# Patient Record
Sex: Female | Born: 1950 | Race: White | Hispanic: No | Smoking: Never smoker
Health system: Southern US, Community
[De-identification: ages and names within clinical notes are randomized; demographics above are authoritative.]

## PROBLEM LIST (undated history)

## (undated) ENCOUNTER — Emergency Department (HOSPITAL_BASED_OUTPATIENT_CLINIC_OR_DEPARTMENT_OTHER): Admission: EM | Payer: Medicare HMO

## (undated) DIAGNOSIS — J302 Other seasonal allergic rhinitis: Secondary | ICD-10-CM

## (undated) DIAGNOSIS — Z8709 Personal history of other diseases of the respiratory system: Secondary | ICD-10-CM

## (undated) DIAGNOSIS — M2550 Pain in unspecified joint: Secondary | ICD-10-CM

## (undated) DIAGNOSIS — K219 Gastro-esophageal reflux disease without esophagitis: Secondary | ICD-10-CM

## (undated) DIAGNOSIS — R531 Weakness: Secondary | ICD-10-CM

## (undated) DIAGNOSIS — Z860100 Personal history of colon polyps, unspecified: Secondary | ICD-10-CM

## (undated) DIAGNOSIS — G4700 Insomnia, unspecified: Secondary | ICD-10-CM

## (undated) DIAGNOSIS — M1990 Unspecified osteoarthritis, unspecified site: Secondary | ICD-10-CM

## (undated) DIAGNOSIS — I10 Essential (primary) hypertension: Secondary | ICD-10-CM

## (undated) DIAGNOSIS — F419 Anxiety disorder, unspecified: Secondary | ICD-10-CM

## (undated) DIAGNOSIS — E785 Hyperlipidemia, unspecified: Secondary | ICD-10-CM

## (undated) DIAGNOSIS — R197 Diarrhea, unspecified: Secondary | ICD-10-CM

## (undated) DIAGNOSIS — G8929 Other chronic pain: Secondary | ICD-10-CM

## (undated) DIAGNOSIS — Z86718 Personal history of other venous thrombosis and embolism: Secondary | ICD-10-CM

## (undated) DIAGNOSIS — M549 Dorsalgia, unspecified: Secondary | ICD-10-CM

## (undated) HISTORY — PX: ESOPHAGOGASTRODUODENOSCOPY: SHX1529

## (undated) HISTORY — PX: ANAL FISSURE REPAIR: SHX2312

## (undated) HISTORY — PX: OTHER SURGICAL HISTORY: SHX169

## (undated) HISTORY — DX: Essential (primary) hypertension: I10

## (undated) HISTORY — PX: WRIST SURGERY: SHX841

## (undated) HISTORY — PX: TONSILLECTOMY: SUR1361

---

## 2000-02-24 ENCOUNTER — Encounter: Payer: Self-pay | Admitting: Emergency Medicine

## 2000-02-24 ENCOUNTER — Emergency Department (HOSPITAL_COMMUNITY): Admission: EM | Admit: 2000-02-24 | Discharge: 2000-02-24 | Payer: Self-pay | Admitting: Emergency Medicine

## 2000-03-15 ENCOUNTER — Ambulatory Visit (HOSPITAL_COMMUNITY): Admission: RE | Admit: 2000-03-15 | Discharge: 2000-03-15 | Payer: Self-pay | Admitting: Orthopedic Surgery

## 2000-12-02 ENCOUNTER — Emergency Department (HOSPITAL_COMMUNITY): Admission: EM | Admit: 2000-12-02 | Discharge: 2000-12-02 | Payer: Self-pay

## 2000-12-03 ENCOUNTER — Emergency Department (HOSPITAL_COMMUNITY): Admission: EM | Admit: 2000-12-03 | Discharge: 2000-12-03 | Payer: Self-pay | Admitting: Emergency Medicine

## 2002-08-04 ENCOUNTER — Encounter: Payer: Self-pay | Admitting: Orthopedic Surgery

## 2002-08-04 ENCOUNTER — Encounter: Admission: RE | Admit: 2002-08-04 | Discharge: 2002-08-04 | Payer: Self-pay | Admitting: Orthopedic Surgery

## 2002-08-17 ENCOUNTER — Encounter: Payer: Self-pay | Admitting: Orthopedic Surgery

## 2002-08-17 ENCOUNTER — Encounter: Admission: RE | Admit: 2002-08-17 | Discharge: 2002-08-17 | Payer: Self-pay | Admitting: Orthopedic Surgery

## 2002-09-01 ENCOUNTER — Encounter: Payer: Self-pay | Admitting: Orthopedic Surgery

## 2002-09-01 ENCOUNTER — Encounter: Admission: RE | Admit: 2002-09-01 | Discharge: 2002-09-01 | Payer: Self-pay | Admitting: Orthopedic Surgery

## 2007-06-22 ENCOUNTER — Inpatient Hospital Stay (HOSPITAL_COMMUNITY): Admission: AD | Admit: 2007-06-22 | Discharge: 2007-06-23 | Payer: Self-pay | Admitting: Orthopedic Surgery

## 2011-03-17 NOTE — Op Note (Signed)
NAMEAYSIA, LOWDER               ACCOUNT NO.:  [id number]   MEDICAL RECORD NO.:  [id number]          PATIENT TYPE:  OIB   LOCATION:  2550                         FACILITY:  [facility]   PHYSICIAN:  Nadara Mustard, MD     DATE OF BIRTH:  1951/09/28   DATE OF PROCEDURE:  06/21/2007  DATE OF DISCHARGE:                               OPERATIVE REPORT   PREOPERATIVE DIAGNOSIS:  Displaced right distal radius fracture.   POSTOPERATIVE DIAGNOSIS:  Displaced right distal radius fracture.   PROCEDURE:  Open reduction internal fixation, right distal radius.   SURGEON:  Nadara Mustard, MD   ANESTHESIA:  General.   ESTIMATED BLOOD LOSS:  Minimal.   ANTIBIOTICS:  1 gram of Kefzol.   DRAINS:  None.   COMPLICATIONS:  None.   TOURNIQUET TIME:  Esmarch at the forearm, approximately 81 minutes.   DISPOSITION:  To PACU in stable condition.   INDICATIONS FOR PROCEDURE:  The patient is a 60 year old woman who had a  nondisplaced intra-articular right distal radius fracture.  The patient  was placed in a splint and was followed up at two weeks out.  The  patient returned in follow-up, shows displacement of fracture.  The  patient states that she had taken the splint off to do several  activities of daily living and this may have been the cause of the  displacement; however, due to the dorsal displacement of fracture and  widening of the fracture, the patient presents at this time for open  reduction internal fixation.  Risks and benefits were discussed  including infection, neurovascular injury, persistent pain, arthritis,  need for additional surgery.  The patient states she understands and  wished proceed at this time.   DESCRIPTION OF PROCEDURE:  The patient was brought to OR room 1 and  underwent general anesthetic.  After adequate level of anesthesia  obtained, the patient's right upper extremities prepped using DuraPrep  and draped into a sterile field.  The extensile approach of Sherilyn Cooter  was  used.  This was then carried down through the FCR sheath and the  neurovascular bundle was retracted radially and the quadratus was  reflected off the radial border of the radius and this was reflected  ulnarly.  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  The malunion was taken down and both the ulnar and  radial fragments of the fracture were taken down and reduced.  C-arm  fluoroscopy verified reduction after takedown of the malunion.  The  Synthes volar plate was then positioned secure with lag screw and then  locking screws were placed into the ulnar segment as well as the radial  styloid segment.  C-arm fluoroscopy verified reduction in both AP and  lateral planes.  She had restoration of the volar tilt to neutral and  restoration of alignment of the displaced interarticular fragment  fracture.  The tourniquet was deflated after approximately 81 minutes.  Hemostasis was obtained, wound was irrigated with normal saline.  Subcu  was closed using Vicryl.  The skin was closed using Proximate staples.  The wound  was covered with Adaptic orthopedic sponges, Webril and a  Coban dressing with a volar splint of plaster.  The patient was  extubated, taken to PACU in stable condition.  Plan for 23 hour  observation, discharge in the morning.      Nadara Mustard, MD  Electronically Signed     MVD/MEDQ  D:  06/21/2007  T:  06/22/2007  Job:  [id number]

## 2011-08-14 LAB — CBC
HCT: 41.5
Hemoglobin: 14.1
MCHC: 34
MCV: 88.3
Platelets: 341
RBC: 4.69
RDW: 13.1
WBC: 6.2

## 2012-11-02 DIAGNOSIS — Z8709 Personal history of other diseases of the respiratory system: Secondary | ICD-10-CM

## 2012-11-02 HISTORY — DX: Personal history of other diseases of the respiratory system: Z87.09

## 2013-06-15 ENCOUNTER — Other Ambulatory Visit: Payer: Self-pay

## 2013-06-15 DIAGNOSIS — Z1231 Encounter for screening mammogram for malignant neoplasm of breast: Secondary | ICD-10-CM

## 2013-06-19 ENCOUNTER — Ambulatory Visit
Admission: RE | Admit: 2013-06-19 | Discharge: 2013-06-19 | Disposition: A | Discharge status: Home / Self Care | Payer: BC Managed Care – PPO | Source: Ambulatory Visit

## 2013-06-19 DIAGNOSIS — Z1231 Encounter for screening mammogram for malignant neoplasm of breast: Secondary | ICD-10-CM

## 2013-06-21 ENCOUNTER — Other Ambulatory Visit: Payer: Self-pay | Admitting: Family Medicine

## 2013-06-21 DIAGNOSIS — R928 Other abnormal and inconclusive findings on diagnostic imaging of breast: Secondary | ICD-10-CM

## 2013-07-10 ENCOUNTER — Other Ambulatory Visit: Payer: BC Managed Care – PPO

## 2014-03-27 DIAGNOSIS — G4700 Insomnia, unspecified: Secondary | ICD-10-CM | POA: Insufficient documentation

## 2014-03-27 DIAGNOSIS — E559 Vitamin D deficiency, unspecified: Secondary | ICD-10-CM | POA: Insufficient documentation

## 2014-03-27 DIAGNOSIS — Z9109 Other allergy status, other than to drugs and biological substances: Secondary | ICD-10-CM | POA: Insufficient documentation

## 2014-03-27 DIAGNOSIS — K219 Gastro-esophageal reflux disease without esophagitis: Secondary | ICD-10-CM | POA: Insufficient documentation

## 2014-03-27 DIAGNOSIS — F419 Anxiety disorder, unspecified: Secondary | ICD-10-CM | POA: Insufficient documentation

## 2014-03-27 DIAGNOSIS — E7800 Pure hypercholesterolemia, unspecified: Secondary | ICD-10-CM | POA: Insufficient documentation

## 2014-04-27 DIAGNOSIS — T887XXA Unspecified adverse effect of drug or medicament, initial encounter: Secondary | ICD-10-CM | POA: Insufficient documentation

## 2014-04-27 DIAGNOSIS — R799 Abnormal finding of blood chemistry, unspecified: Secondary | ICD-10-CM | POA: Insufficient documentation

## 2014-04-27 DIAGNOSIS — R030 Elevated blood-pressure reading, without diagnosis of hypertension: Secondary | ICD-10-CM | POA: Insufficient documentation

## 2014-04-27 DIAGNOSIS — M778 Other enthesopathies, not elsewhere classified: Secondary | ICD-10-CM | POA: Insufficient documentation

## 2014-04-27 DIAGNOSIS — IMO0002 Reserved for concepts with insufficient information to code with codable children: Secondary | ICD-10-CM | POA: Insufficient documentation

## 2014-04-27 DIAGNOSIS — H18519 Endothelial corneal dystrophy, unspecified eye: Secondary | ICD-10-CM | POA: Insufficient documentation

## 2014-04-27 DIAGNOSIS — R233 Spontaneous ecchymoses: Secondary | ICD-10-CM | POA: Insufficient documentation

## 2014-04-27 DIAGNOSIS — H8110 Benign paroxysmal vertigo, unspecified ear: Secondary | ICD-10-CM | POA: Insufficient documentation

## 2014-04-27 DIAGNOSIS — M940 Chondrocostal junction syndrome [Tietze]: Secondary | ICD-10-CM | POA: Insufficient documentation

## 2014-04-27 DIAGNOSIS — I10 Essential (primary) hypertension: Secondary | ICD-10-CM | POA: Insufficient documentation

## 2014-04-27 DIAGNOSIS — L820 Inflamed seborrheic keratosis: Secondary | ICD-10-CM | POA: Insufficient documentation

## 2014-04-27 DIAGNOSIS — E663 Overweight: Secondary | ICD-10-CM | POA: Insufficient documentation

## 2014-04-27 DIAGNOSIS — M7580 Other shoulder lesions, unspecified shoulder: Secondary | ICD-10-CM

## 2014-04-27 DIAGNOSIS — J309 Allergic rhinitis, unspecified: Secondary | ICD-10-CM | POA: Insufficient documentation

## 2014-04-27 DIAGNOSIS — H1851 Endothelial corneal dystrophy: Secondary | ICD-10-CM

## 2014-04-27 DIAGNOSIS — R740 Nonspecific elevation of levels of transaminase and lactic acid dehydrogenase [LDH]: Secondary | ICD-10-CM

## 2014-04-27 DIAGNOSIS — F4320 Adjustment disorder, unspecified: Secondary | ICD-10-CM | POA: Insufficient documentation

## 2014-05-21 ENCOUNTER — Other Ambulatory Visit: Payer: Self-pay

## 2014-05-21 DIAGNOSIS — Z1231 Encounter for screening mammogram for malignant neoplasm of breast: Secondary | ICD-10-CM

## 2014-06-20 ENCOUNTER — Ambulatory Visit
Admission: RE | Admit: 2014-06-20 | Discharge: 2014-06-20 | Disposition: A | Discharge status: Home / Self Care | Payer: BC Managed Care – PPO | Source: Ambulatory Visit

## 2014-06-20 DIAGNOSIS — Z1231 Encounter for screening mammogram for malignant neoplasm of breast: Secondary | ICD-10-CM

## 2014-09-04 ENCOUNTER — Other Ambulatory Visit (HOSPITAL_COMMUNITY): Payer: Self-pay | Admitting: Neurosurgery

## 2014-10-03 ENCOUNTER — Encounter (HOSPITAL_COMMUNITY): Payer: Self-pay

## 2014-10-03 ENCOUNTER — Encounter (HOSPITAL_COMMUNITY)
Admission: RE | Admit: 2014-10-03 | Discharge: 2014-10-03 | Disposition: A | Discharge status: Home / Self Care | Payer: BC Managed Care – PPO | Source: Ambulatory Visit | Attending: Neurosurgery | Admitting: Neurosurgery

## 2014-10-03 ENCOUNTER — Encounter (HOSPITAL_COMMUNITY): Payer: Self-pay | Admitting: Pharmacy Technician

## 2014-10-03 DIAGNOSIS — Z01812 Encounter for preprocedural laboratory examination: Secondary | ICD-10-CM | POA: Diagnosis not present

## 2014-10-03 HISTORY — DX: Other seasonal allergic rhinitis: J30.2

## 2014-10-03 HISTORY — DX: Personal history of other diseases of the respiratory system: Z87.09

## 2014-10-03 HISTORY — DX: Gastro-esophageal reflux disease without esophagitis: K21.9

## 2014-10-03 HISTORY — DX: Weakness: R53.1

## 2014-10-03 HISTORY — DX: Unspecified osteoarthritis, unspecified site: M19.90

## 2014-10-03 HISTORY — DX: Personal history of colonic polyps: Z86.010

## 2014-10-03 HISTORY — DX: Other chronic pain: G89.29

## 2014-10-03 HISTORY — DX: Insomnia, unspecified: G47.00

## 2014-10-03 HISTORY — DX: Diarrhea, unspecified: R19.7

## 2014-10-03 HISTORY — DX: Hyperlipidemia, unspecified: E78.5

## 2014-10-03 HISTORY — DX: Dorsalgia, unspecified: M54.9

## 2014-10-03 HISTORY — DX: Personal history of other venous thrombosis and embolism: Z86.718

## 2014-10-03 HISTORY — DX: Pain in unspecified joint: M25.50

## 2014-10-03 HISTORY — DX: Anxiety disorder, unspecified: F41.9

## 2014-10-03 HISTORY — DX: Personal history of colon polyps, unspecified: Z86.0100

## 2014-10-03 LAB — BASIC METABOLIC PANEL
Anion gap: 14 (ref 5–15)
BUN: 18 mg/dL (ref 6–23)
CHLORIDE: 100 meq/L (ref 96–112)
CO2: 23 mEq/L (ref 19–32)
CREATININE: 0.63 mg/dL (ref 0.50–1.10)
Calcium: 9 mg/dL (ref 8.4–10.5)
GLUCOSE: 92 mg/dL (ref 70–99)
POTASSIUM: 4.5 meq/L (ref 3.7–5.3)
SODIUM: 137 meq/L (ref 137–147)

## 2014-10-03 LAB — CBC
HCT: 41.9 % (ref 36.0–46.0)
HEMOGLOBIN: 14.2 g/dL (ref 12.0–15.0)
MCH: 29.2 pg (ref 26.0–34.0)
MCHC: 33.9 g/dL (ref 30.0–36.0)
MCV: 86.2 fL (ref 78.0–100.0)
PLATELETS: 256 10*3/uL (ref 150–400)
RBC: 4.86 MIL/uL (ref 3.87–5.11)
RDW: 12.8 % (ref 11.5–15.5)
WBC: 7.3 10*3/uL (ref 4.0–10.5)

## 2014-10-03 LAB — SURGICAL PCR SCREEN
MRSA, PCR: NEGATIVE
Staphylococcus aureus: NEGATIVE

## 2014-10-03 LAB — TYPE AND SCREEN
ABO/RH(D): B POS
Antibody Screen: NEGATIVE

## 2014-10-03 LAB — ABO/RH: ABO/RH(D): B POS

## 2014-10-03 NOTE — Pre-Procedure Instructions (Signed)
Ashley NajjarJudy V Craig  10/03/2014   Your procedure is scheduled on:  Thurs, Dec 10 @ 7:30 AM  [facility]  at 5:30 AM.  Call this number if you have problems the morning of surgery: [phone]   Remember:   Do not eat food or drink liquids after midnight.   Take these medicines the morning of surgery with A SIP OF WATER: Gabapentin(Neurontin),Omeprazole(Prilosec),Claritin,ProAir(if needed) and Nasonex(if needed)              No Goody's,BC's,Aleve,Aspirin,Ibuprofen,Fish Oil,or any Herbal Medications   Do not wear jewelry, make-up or nail polish.  Do not wear lotions, powders, or perfumes. You may wear deodorant.  Do not shave 48 hours prior to surgery.   Do not bring valuables to the hospital.  [facility] is not responsible                  for any belongings or valuables.               Contacts, dentures or bridgework may not be worn into surgery.  Leave suitcase in the car. After surgery it may be brought to your room.  For patients admitted to the hospital, discharge time is determined by your                treatment team.                 Special Instructions:  [place] - Preparing for Surgery  Before surgery, you can play an important role.  Because skin is not sterile, your skin needs to be as free of germs as possible.  You can reduce the number of germs on you skin by washing with CHG (chlorahexidine gluconate) soap before surgery.  CHG is an antiseptic cleaner which kills germs and bonds with the skin to continue killing germs even after washing.  Please DO NOT use if you have an allergy to CHG or antibacterial soaps.  If your skin becomes reddened/irritated stop using the CHG and inform your nurse when you arrive at Short Stay.  Do not shave (including legs and underarms) for at least 48 hours prior to the first CHG shower.  You may shave your face.  Please follow these instructions carefully:   1.  Shower with CHG Soap the night before surgery and the                                 morning of Surgery.  2.  If you choose to wash your hair, wash your hair first as usual with your       normal shampoo.  3.  After you shampoo, rinse your hair and body thoroughly to remove the                      Shampoo.  4.  Use CHG as you would any other liquid soap.  You can apply chg directly       to the skin and wash gently with scrungie or a clean washcloth.  5.  Apply the CHG Soap to your body ONLY FROM THE NECK DOWN.        Do not use on open wounds or open sores.  Avoid contact with your eyes,       ears, mouth and genitals (private parts).  Wash genitals (private parts)       with your  normal soap.  6.  Wash thoroughly, paying special attention to the area where your surgery        will be performed.  7.  Thoroughly rinse your body with warm water from the neck down.  8.  DO NOT shower/wash with your normal soap after using and rinsing off       the CHG Soap.  9.  Pat yourself dry with a clean towel.            10.  Wear clean pajamas.            11.  Place clean sheets on your bed the night of your first shower and do not        sleep with pets.  Day of Surgery  Do not apply any lotions/deoderants the morning of surgery.  Please wear clean clothes to the hospital/surgery center.     Please read over the following fact sheets that you were given: Pain Booklet, Coughing and Deep Breathing, Blood Transfusion Information, MRSA Information and Surgical Site Infection Prevention

## 2014-10-03 NOTE — Progress Notes (Signed)
Dr.Spencer Donnie Ahoilley is cardiologist (saw him d/t heart racing while going through a divorce) hasn't seen him in 13 yrs  Denies ever having an echo/stress test/heart cath  Did wear a Holter Monitor  Denies EKG or CXR in past yr   [facility]

## 2014-10-10 MED ORDER — CEFAZOLIN SODIUM-DEXTROSE 2-3 GM-% IV SOLR
2.0000 g | INTRAVENOUS | Status: AC
  Administered 2014-10-11 (×2): 2 g via INTRAVENOUS
  Filled 2014-10-10: qty 50

## 2014-10-11 ENCOUNTER — Inpatient Hospital Stay (HOSPITAL_COMMUNITY): Payer: BC Managed Care – PPO

## 2014-10-11 ENCOUNTER — Encounter (HOSPITAL_COMMUNITY)
Admission: RE | Disposition: A | Discharge status: Home / Self Care | Payer: BC Managed Care – PPO | Source: Ambulatory Visit | Attending: Neurosurgery

## 2014-10-11 ENCOUNTER — Inpatient Hospital Stay (HOSPITAL_COMMUNITY): Payer: BC Managed Care – PPO | Admitting: Anesthesiology

## 2014-10-11 ENCOUNTER — Inpatient Hospital Stay (HOSPITAL_COMMUNITY)
Admission: RE | Admit: 2014-10-11 | Discharge: 2014-10-12 | DRG: 460 | Disposition: A | Discharge status: Home / Self Care | Payer: BC Managed Care – PPO | Source: Ambulatory Visit | Attending: Neurosurgery | Admitting: Neurosurgery

## 2014-10-11 ENCOUNTER — Encounter (HOSPITAL_COMMUNITY): Payer: Self-pay | Admitting: *Deleted

## 2014-10-11 DIAGNOSIS — F419 Anxiety disorder, unspecified: Secondary | ICD-10-CM | POA: Diagnosis present

## 2014-10-11 DIAGNOSIS — M5117 Intervertebral disc disorders with radiculopathy, lumbosacral region: Secondary | ICD-10-CM | POA: Diagnosis present

## 2014-10-11 DIAGNOSIS — E785 Hyperlipidemia, unspecified: Secondary | ICD-10-CM | POA: Diagnosis present

## 2014-10-11 DIAGNOSIS — Z79899 Other long term (current) drug therapy: Secondary | ICD-10-CM

## 2014-10-11 DIAGNOSIS — M4806 Spinal stenosis, lumbar region: Secondary | ICD-10-CM | POA: Diagnosis present

## 2014-10-11 DIAGNOSIS — K219 Gastro-esophageal reflux disease without esophagitis: Secondary | ICD-10-CM | POA: Diagnosis present

## 2014-10-11 DIAGNOSIS — M48061 Spinal stenosis, lumbar region without neurogenic claudication: Secondary | ICD-10-CM | POA: Diagnosis present

## 2014-10-11 DIAGNOSIS — M47896 Other spondylosis, lumbar region: Secondary | ICD-10-CM | POA: Diagnosis present

## 2014-10-11 DIAGNOSIS — G4700 Insomnia, unspecified: Secondary | ICD-10-CM | POA: Diagnosis present

## 2014-10-11 DIAGNOSIS — M5126 Other intervertebral disc displacement, lumbar region: Secondary | ICD-10-CM | POA: Diagnosis present

## 2014-10-11 DIAGNOSIS — M1990 Unspecified osteoarthritis, unspecified site: Secondary | ICD-10-CM | POA: Diagnosis present

## 2014-10-11 DIAGNOSIS — M545 Low back pain: Secondary | ICD-10-CM | POA: Diagnosis present

## 2014-10-11 SURGERY — POSTERIOR LUMBAR FUSION 1 LEVEL
Anesthesia: General | Site: Back

## 2014-10-11 MED ORDER — THROMBIN 20000 UNITS EX SOLR
CUTANEOUS | Status: DC | PRN
  Administered 2014-10-11: 10:00:00 via TOPICAL

## 2014-10-11 MED ORDER — ONDANSETRON HCL 4 MG/2ML IJ SOLN
INTRAMUSCULAR | Status: DC | PRN
  Administered 2014-10-11: 4 mg via INTRAVENOUS

## 2014-10-11 MED ORDER — KETOROLAC TROMETHAMINE 30 MG/ML IJ SOLN
INTRAMUSCULAR | Status: AC
Start: 2014-10-11 — End: 2014-10-12
  Filled 2014-10-11: qty 1

## 2014-10-11 MED ORDER — TEMAZEPAM 30 MG PO CAPS: 30.0000 mg | ORAL_CAPSULE | Freq: Every evening | ORAL | Status: DC | PRN

## 2014-10-11 MED ORDER — HYDROCODONE-ACETAMINOPHEN 5-325 MG PO TABS: 1.0000 | ORAL_TABLET | ORAL | Status: DC | PRN

## 2014-10-11 MED ORDER — MIDAZOLAM HCL 2 MG/2ML IJ SOLN
INTRAMUSCULAR | Status: AC
  Filled 2014-10-11: qty 2

## 2014-10-11 MED ORDER — OXYCODONE HCL 5 MG/5ML PO SOLN: 5.0000 mg | Freq: Once | ORAL | Status: AC | PRN

## 2014-10-11 MED ORDER — ACETAMINOPHEN 650 MG RE SUPP: 650.0000 mg | RECTAL | Status: DC | PRN

## 2014-10-11 MED ORDER — LORATADINE 10 MG PO TABS
10.0000 mg | ORAL_TABLET | Freq: Every day | ORAL | Status: DC
  Filled 2014-10-11: qty 1

## 2014-10-11 MED ORDER — MONTELUKAST SODIUM 10 MG PO TABS
10.0000 mg | ORAL_TABLET | Freq: Every morning | ORAL | Status: DC
  Filled 2014-10-11 (×2): qty 1

## 2014-10-11 MED ORDER — FENTANYL CITRATE 0.05 MG/ML IJ SOLN
INTRAMUSCULAR | Status: AC
  Filled 2014-10-11: qty 5

## 2014-10-11 MED ORDER — DEXAMETHASONE SODIUM PHOSPHATE 10 MG/ML IJ SOLN
INTRAMUSCULAR | Status: DC | PRN
  Administered 2014-10-11: 10 mg via INTRAVENOUS

## 2014-10-11 MED ORDER — HYDROMORPHONE HCL 1 MG/ML IJ SOLN
0.2500 mg | INTRAMUSCULAR | Status: DC | PRN
  Administered 2014-10-11: 0.25 mg via INTRAVENOUS
  Administered 2014-10-11 (×2): 0.5 mg via INTRAVENOUS
  Administered 2014-10-11: 0.25 mg via INTRAVENOUS

## 2014-10-11 MED ORDER — SODIUM CHLORIDE 0.9 % IJ SOLN
3.0000 mL | Freq: Two times a day (BID) | INTRAMUSCULAR | Status: DC
  Administered 2014-10-11: 3 mL via INTRAVENOUS

## 2014-10-11 MED ORDER — PHENYLEPHRINE HCL 10 MG/ML IJ SOLN
10.0000 mg | INTRAVENOUS | Status: DC | PRN
  Administered 2014-10-11: 20 ug/min via INTRAVENOUS

## 2014-10-11 MED ORDER — OXYCODONE-ACETAMINOPHEN 5-325 MG PO TABS
1.0000 | ORAL_TABLET | ORAL | Status: DC | PRN
  Administered 2014-10-11 – 2014-10-12 (×3): 2 via ORAL
  Filled 2014-10-11 (×3): qty 2

## 2014-10-11 MED ORDER — KETOROLAC TROMETHAMINE 30 MG/ML IJ SOLN
30.0000 mg | Freq: Four times a day (QID) | INTRAMUSCULAR | Status: DC
  Administered 2014-10-11 – 2014-10-12 (×3): 30 mg via INTRAVENOUS
  Filled 2014-10-11 (×7): qty 1

## 2014-10-11 MED ORDER — SERTRALINE HCL 50 MG PO TABS
150.0000 mg | ORAL_TABLET | Freq: Every day | ORAL | Status: DC
  Filled 2014-10-11: qty 1

## 2014-10-11 MED ORDER — FENTANYL CITRATE 0.05 MG/ML IJ SOLN
INTRAMUSCULAR | Status: DC | PRN
  Administered 2014-10-11: 150 ug via INTRAVENOUS
  Administered 2014-10-11: 25 ug via INTRAVENOUS
  Administered 2014-10-11: 50 ug via INTRAVENOUS
  Administered 2014-10-11: 25 ug via INTRAVENOUS
  Administered 2014-10-11: 50 ug via INTRAVENOUS

## 2014-10-11 MED ORDER — SUCCINYLCHOLINE CHLORIDE 20 MG/ML IJ SOLN
INTRAMUSCULAR | Status: AC
  Filled 2014-10-11: qty 1

## 2014-10-11 MED ORDER — DEXTROSE-NACL 5-0.45 % IV SOLN: INTRAVENOUS | Status: DC

## 2014-10-11 MED ORDER — ONDANSETRON HCL 4 MG/2ML IJ SOLN: 4.0000 mg | Freq: Four times a day (QID) | INTRAMUSCULAR | Status: DC | PRN

## 2014-10-11 MED ORDER — ACETAMINOPHEN 325 MG PO TABS: 650.0000 mg | ORAL_TABLET | ORAL | Status: DC | PRN

## 2014-10-11 MED ORDER — NEOSTIGMINE METHYLSULFATE 10 MG/10ML IV SOLN
INTRAVENOUS | Status: AC
  Filled 2014-10-11: qty 1

## 2014-10-11 MED ORDER — SIMVASTATIN 20 MG PO TABS
20.0000 mg | ORAL_TABLET | Freq: Every day | ORAL | Status: DC
  Administered 2014-10-11: 20 mg via ORAL
  Filled 2014-10-11 (×2): qty 1

## 2014-10-11 MED ORDER — LIDOCAINE HCL (CARDIAC) 20 MG/ML IV SOLN
INTRAVENOUS | Status: DC | PRN
Start: 2014-10-11 — End: 2014-10-11
  Administered 2014-10-11: 25 mg via INTRAVENOUS

## 2014-10-11 MED ORDER — PHENYLEPHRINE HCL 10 MG/ML IJ SOLN
INTRAMUSCULAR | Status: DC | PRN
  Administered 2014-10-11 (×2): 40 ug via INTRAVENOUS

## 2014-10-11 MED ORDER — PROPOFOL 10 MG/ML IV BOLUS
INTRAVENOUS | Status: DC | PRN
  Administered 2014-10-11: 160 mg via INTRAVENOUS

## 2014-10-11 MED ORDER — THROMBIN 5000 UNITS EX SOLR
OROMUCOSAL | Status: DC | PRN
  Administered 2014-10-11: 10:00:00 via TOPICAL

## 2014-10-11 MED ORDER — HYDROXYZINE HCL 25 MG PO TABS: 50.0000 mg | ORAL_TABLET | ORAL | Status: DC | PRN

## 2014-10-11 MED ORDER — SODIUM CHLORIDE 0.9 % IJ SOLN: 3.0000 mL | INTRAMUSCULAR | Status: DC | PRN

## 2014-10-11 MED ORDER — CYCLOBENZAPRINE HCL 10 MG PO TABS
10.0000 mg | ORAL_TABLET | Freq: Three times a day (TID) | ORAL | Status: DC | PRN
  Administered 2014-10-11: 10 mg via ORAL

## 2014-10-11 MED ORDER — PROPOFOL 10 MG/ML IV BOLUS
INTRAVENOUS | Status: AC
  Filled 2014-10-11: qty 20

## 2014-10-11 MED ORDER — GLYCOPYRROLATE 0.2 MG/ML IJ SOLN
INTRAMUSCULAR | Status: DC | PRN
  Administered 2014-10-11: 0.6 mg via INTRAVENOUS

## 2014-10-11 MED ORDER — MIDAZOLAM HCL 5 MG/5ML IJ SOLN
INTRAMUSCULAR | Status: DC | PRN
  Administered 2014-10-11: 2 mg via INTRAVENOUS

## 2014-10-11 MED ORDER — LIDOCAINE-EPINEPHRINE 1 %-1:100000 IJ SOLN
INTRAMUSCULAR | Status: DC | PRN
  Administered 2014-10-11: 20 mL

## 2014-10-11 MED ORDER — GLYCOPYRROLATE 0.2 MG/ML IJ SOLN
INTRAMUSCULAR | Status: AC
Start: 2014-10-11 — End: 2014-10-11
  Filled 2014-10-11: qty 3

## 2014-10-11 MED ORDER — LACTATED RINGERS IV SOLN
INTRAVENOUS | Status: DC | PRN
  Administered 2014-10-11 (×2): via INTRAVENOUS

## 2014-10-11 MED ORDER — MAGNESIUM HYDROXIDE 400 MG/5ML PO SUSP: 30.0000 mL | Freq: Every day | ORAL | Status: DC | PRN

## 2014-10-11 MED ORDER — 0.9 % SODIUM CHLORIDE (POUR BTL) OPTIME
TOPICAL | Status: DC | PRN
  Administered 2014-10-11: 1000 mL

## 2014-10-11 MED ORDER — BUPIVACAINE HCL (PF) 0.5 % IJ SOLN
INTRAMUSCULAR | Status: DC | PRN
  Administered 2014-10-11: 20 mL

## 2014-10-11 MED ORDER — NEOSTIGMINE METHYLSULFATE 10 MG/10ML IV SOLN
INTRAVENOUS | Status: DC | PRN
  Administered 2014-10-11: 4 mg via INTRAVENOUS

## 2014-10-11 MED ORDER — HYDROMORPHONE HCL 1 MG/ML IJ SOLN
INTRAMUSCULAR | Status: AC
  Filled 2014-10-11: qty 1

## 2014-10-11 MED ORDER — ACETAMINOPHEN 10 MG/ML IV SOLN
INTRAVENOUS | Status: DC | PRN
  Administered 2014-10-11: 1000 mg via INTRAVENOUS

## 2014-10-11 MED ORDER — MENTHOL 3 MG MT LOZG: 1.0000 | LOZENGE | OROMUCOSAL | Status: DC | PRN

## 2014-10-11 MED ORDER — MORPHINE SULFATE 4 MG/ML IJ SOLN: 4.0000 mg | INTRAMUSCULAR | Status: DC | PRN

## 2014-10-11 MED ORDER — CLONAZEPAM 0.5 MG PO TABS: 0.5000 mg | ORAL_TABLET | Freq: Every day | ORAL | Status: DC | PRN

## 2014-10-11 MED ORDER — ROCURONIUM BROMIDE 100 MG/10ML IV SOLN
INTRAVENOUS | Status: DC | PRN
  Administered 2014-10-11: 50 mg via INTRAVENOUS

## 2014-10-11 MED ORDER — SODIUM CHLORIDE 0.9 % IR SOLN
Status: DC | PRN
  Administered 2014-10-11: 500 mL

## 2014-10-11 MED ORDER — KETOROLAC TROMETHAMINE 30 MG/ML IJ SOLN
30.0000 mg | Freq: Once | INTRAMUSCULAR | Status: AC
  Administered 2014-10-11: 30 mg via INTRAVENOUS

## 2014-10-11 MED ORDER — EPHEDRINE SULFATE 50 MG/ML IJ SOLN
INTRAMUSCULAR | Status: DC | PRN
  Administered 2014-10-11 (×3): 10 mg via INTRAVENOUS

## 2014-10-11 MED ORDER — ONDANSETRON HCL 4 MG/2ML IJ SOLN: 4.0000 mg | Freq: Once | INTRAMUSCULAR | Status: DC | PRN

## 2014-10-11 MED ORDER — VECURONIUM BROMIDE 10 MG IV SOLR
INTRAVENOUS | Status: DC | PRN
  Administered 2014-10-11 (×3): 2 mg via INTRAVENOUS

## 2014-10-11 MED ORDER — MEPERIDINE HCL 25 MG/ML IJ SOLN: 6.2500 mg | INTRAMUSCULAR | Status: DC | PRN

## 2014-10-11 MED ORDER — PHENOL 1.4 % MT LIQD: 1.0000 | OROMUCOSAL | Status: DC | PRN

## 2014-10-11 MED ORDER — PANTOPRAZOLE SODIUM 40 MG PO TBEC: 40.0000 mg | DELAYED_RELEASE_TABLET | Freq: Every day | ORAL | Status: DC

## 2014-10-11 MED ORDER — CYCLOBENZAPRINE HCL 10 MG PO TABS
ORAL_TABLET | ORAL | Status: AC
  Filled 2014-10-11: qty 1

## 2014-10-11 MED ORDER — GABAPENTIN 300 MG PO CAPS
300.0000 mg | ORAL_CAPSULE | Freq: Three times a day (TID) | ORAL | Status: DC
  Administered 2014-10-11 (×2): 300 mg via ORAL
  Filled 2014-10-11 (×5): qty 1

## 2014-10-11 MED ORDER — ACETAMINOPHEN 10 MG/ML IV SOLN
1000.0000 mg | INTRAVENOUS | Status: DC
  Filled 2014-10-11: qty 100

## 2014-10-11 MED ORDER — BISACODYL 10 MG RE SUPP: 10.0000 mg | Freq: Every day | RECTAL | Status: DC | PRN

## 2014-10-11 MED ORDER — LIDOCAINE HCL (CARDIAC) 20 MG/ML IV SOLN
INTRAVENOUS | Status: AC
  Filled 2014-10-11: qty 5

## 2014-10-11 MED ORDER — OXYCODONE HCL 5 MG PO TABS
ORAL_TABLET | ORAL | Status: AC
  Filled 2014-10-11: qty 1

## 2014-10-11 MED ORDER — ARTIFICIAL TEARS OP OINT
TOPICAL_OINTMENT | OPHTHALMIC | Status: DC | PRN
  Administered 2014-10-11: 1 via OPHTHALMIC

## 2014-10-11 MED ORDER — SODIUM CHLORIDE 0.9 % IV SOLN: 250.0000 mL | INTRAVENOUS | Status: DC

## 2014-10-11 MED ORDER — ONDANSETRON HCL 4 MG/2ML IJ SOLN
INTRAMUSCULAR | Status: AC
  Filled 2014-10-11: qty 2

## 2014-10-11 MED ORDER — ALBUTEROL SULFATE (2.5 MG/3ML) 0.083% IN NEBU: 3.0000 mL | INHALATION_SOLUTION | RESPIRATORY_TRACT | Status: DC | PRN

## 2014-10-11 MED ORDER — OXYCODONE HCL 5 MG PO TABS
5.0000 mg | ORAL_TABLET | Freq: Once | ORAL | Status: AC | PRN
  Administered 2014-10-11: 5 mg via ORAL

## 2014-10-11 MED ORDER — ALUM & MAG HYDROXIDE-SIMETH 200-200-20 MG/5ML PO SUSP: 30.0000 mL | Freq: Four times a day (QID) | ORAL | Status: DC | PRN

## 2014-10-11 MED ORDER — ROCURONIUM BROMIDE 50 MG/5ML IV SOLN
INTRAVENOUS | Status: AC
  Filled 2014-10-11: qty 1

## 2014-10-11 SURGICAL SUPPLY — 72 items
BAG DECANTER FOR FLEXI CONT (MISCELLANEOUS) ×2 IMPLANT
BLADE CLIPPER SURG (BLADE) IMPLANT
BRUSH SCRUB EZ PLAIN DRY (MISCELLANEOUS) ×2 IMPLANT
BUR ACRON 5.0MM COATED (BURR) ×2 IMPLANT
BUR MATCHSTICK NEURO 3.0 LAGG (BURR) ×2 IMPLANT
CANISTER SUCT 3000ML (MISCELLANEOUS) ×2 IMPLANT
CAP LCK SPNE (Orthopedic Implant) ×4 IMPLANT
CAP LOCK SPINE RADIUS (Orthopedic Implant) ×4 IMPLANT
CAP LOCKING (Orthopedic Implant) ×8 IMPLANT
CONT SPEC 4OZ CLIKSEAL STRL BL (MISCELLANEOUS) ×4 IMPLANT
COVER BACK TABLE 60X90IN (DRAPES) ×2 IMPLANT
DRAPE C-ARM 42X72 X-RAY (DRAPES) ×6 IMPLANT
DRAPE LAPAROTOMY 100X72X124 (DRAPES) ×2 IMPLANT
DRAPE POUCH INSTRU U-SHP 10X18 (DRAPES) ×2 IMPLANT
DRAPE PROXIMA HALF (DRAPES) ×4 IMPLANT
DRSG EMULSION OIL 3X3 NADH (GAUZE/BANDAGES/DRESSINGS) IMPLANT
ELECT BLADE 4.0 EZ CLEAN MEGAD (MISCELLANEOUS) ×2
ELECT REM PT RETURN 9FT ADLT (ELECTROSURGICAL) ×2
ELECTRODE BLDE 4.0 EZ CLN MEGD (MISCELLANEOUS) ×1 IMPLANT
ELECTRODE REM PT RTRN 9FT ADLT (ELECTROSURGICAL) ×1 IMPLANT
GAUZE SPONGE 4X4 12PLY STRL (GAUZE/BANDAGES/DRESSINGS) ×2 IMPLANT
GAUZE SPONGE 4X4 16PLY XRAY LF (GAUZE/BANDAGES/DRESSINGS) IMPLANT
GLOVE BIOGEL PI IND STRL 8 (GLOVE) ×6 IMPLANT
GLOVE BIOGEL PI INDICATOR 8 (GLOVE) ×6
GLOVE ECLIPSE 7.5 STRL STRAW (GLOVE) ×12 IMPLANT
GLOVE EXAM NITRILE LRG STRL (GLOVE) IMPLANT
GLOVE EXAM NITRILE MD LF STRL (GLOVE) IMPLANT
GLOVE EXAM NITRILE XL STR (GLOVE) IMPLANT
GLOVE EXAM NITRILE XS STR PU (GLOVE) IMPLANT
GOWN STRL REUS W/ TWL LRG LVL3 (GOWN DISPOSABLE) ×2 IMPLANT
GOWN STRL REUS W/ TWL XL LVL3 (GOWN DISPOSABLE) ×1 IMPLANT
GOWN STRL REUS W/TWL 2XL LVL3 (GOWN DISPOSABLE) ×4 IMPLANT
GOWN STRL REUS W/TWL LRG LVL3 (GOWN DISPOSABLE) ×2
GOWN STRL REUS W/TWL XL LVL3 (GOWN DISPOSABLE) ×1
KIT BASIN OR (CUSTOM PROCEDURE TRAY) ×2 IMPLANT
KIT INFUSE X SMALL 1.4CC (Orthopedic Implant) ×2 IMPLANT
KIT ROOM TURNOVER OR (KITS) ×2 IMPLANT
LIQUID BAND (GAUZE/BANDAGES/DRESSINGS) ×4 IMPLANT
MILL MEDIUM DISP (BLADE) ×2 IMPLANT
NEEDLE ASP BONE MRW (NEEDLE) ×2 IMPLANT
NEEDLE BONE MARROW 8GAX6 (NEEDLE) IMPLANT
NEEDLE SPNL 18GX3.5 QUINCKE PK (NEEDLE) ×2 IMPLANT
NEEDLE SPNL 22GX3.5 QUINCKE BK (NEEDLE) ×4 IMPLANT
NS IRRIG 1000ML POUR BTL (IV SOLUTION) ×2 IMPLANT
PACK LAMINECTOMY NEURO (CUSTOM PROCEDURE TRAY) ×2 IMPLANT
PAD ARMBOARD 7.5X6 YLW CONV (MISCELLANEOUS) ×6 IMPLANT
PATTIES SURGICAL .5 X.5 (GAUZE/BANDAGES/DRESSINGS) IMPLANT
PATTIES SURGICAL .5 X1 (DISPOSABLE) IMPLANT
PATTIES SURGICAL 1X1 (DISPOSABLE) IMPLANT
PEEK PLIF AVS 8X25X4 DEGREE (Peek) ×4 IMPLANT
ROD 5.5X30MM (Rod) ×2 IMPLANT
ROD RADIUS 35MM (Rod) ×2 IMPLANT
SCREW 5.75X40M (Screw) ×6 IMPLANT
SCREW 5.75X45MM (Screw) ×2 IMPLANT
SPONGE LAP 4X18 X RAY DECT (DISPOSABLE) IMPLANT
SPONGE NEURO XRAY DETECT 1X3 (DISPOSABLE) IMPLANT
SPONGE SURGIFOAM ABS GEL 100 (HEMOSTASIS) ×2 IMPLANT
STRIP BIOACTIVE VITOSS 25X100X (Neuro Prosthesis/Implant) ×2 IMPLANT
STRIP BIOACTIVE VITOSS 25X52X4 (Orthopedic Implant) ×2 IMPLANT
SUT VIC AB 1 CT1 18XBRD ANBCTR (SUTURE) ×2 IMPLANT
SUT VIC AB 1 CT1 8-18 (SUTURE) ×2
SUT VIC AB 2-0 CP2 18 (SUTURE) ×6 IMPLANT
SYR 20ML ECCENTRIC (SYRINGE) ×2 IMPLANT
SYR 3ML LL SCALE MARK (SYRINGE) ×8 IMPLANT
SYR CONTROL 10ML LL (SYRINGE) ×2 IMPLANT
TAPE CLOTH SURG 4X10 WHT LF (GAUZE/BANDAGES/DRESSINGS) ×2 IMPLANT
TOWEL OR 17X24 6PK STRL BLUE (TOWEL DISPOSABLE) ×2 IMPLANT
TOWEL OR 17X26 10 PK STRL BLUE (TOWEL DISPOSABLE) ×2 IMPLANT
TRAY FOLEY CATH 14FRSI W/METER (CATHETERS) ×2 IMPLANT
TUBE CONNECTING 12X1/4 (SUCTIONS) ×2 IMPLANT
WATER STERILE IRR 1000ML POUR (IV SOLUTION) ×2 IMPLANT
YANKAUER SUCT BULB TIP NO VENT (SUCTIONS) ×2 IMPLANT

## 2014-10-11 NOTE — Anesthesia Postprocedure Evaluation (Signed)
Anesthesia Post Note  Patient: Ashley NajjarJudy V Lezcano  Procedure(s) Performed: Procedure(s) (LRB): LUMBAR FIVE TO SACRAL ONE POSTERIOR LUMBAR FUSION 1 LEVEL (N/A)  Anesthesia type: general  Patient location: PACU  Post pain: Pain level controlled  Post assessment: Patient's Cardiovascular Status Stable  Last Vitals:  Filed Vitals:   10/11/14 1448  BP: 134/68  Pulse: 81  Temp: 36.7 C  Resp: 16    Post vital signs: Reviewed and stable  Level of consciousness: sedated  Complications: No apparent anesthesia complications

## 2014-10-11 NOTE — Anesthesia Preprocedure Evaluation (Addendum)
Anesthesia Evaluation  Patient identified by MRN, date of birth, ID band Patient awake    Reviewed: Allergy & Precautions, H&P , NPO status , Patient's Chart, lab work & pertinent test results  Airway Mallampati: I  TM Distance: >3 FB Neck ROM: Full    Dental  (+) Teeth Intact, Dental Advisory Given   Pulmonary          Cardiovascular     Neuro/Psych    GI/Hepatic GERD-  Medicated and Controlled,  Endo/Other    Renal/GU      Musculoskeletal  (+) Arthritis -,   Abdominal   Peds  Hematology   Anesthesia Other Findings   Reproductive/Obstetrics                            Anesthesia Physical Anesthesia Plan  ASA: II  Anesthesia Plan: General   Post-op Pain Management:    Induction: Intravenous  Airway Management Planned: Oral ETT  Additional Equipment:   Intra-op Plan:   Post-operative Plan: Extubation in OR  Informed Consent: I have reviewed the patients History and Physical, chart, labs and discussed the procedure including the risks, benefits and alternatives for the proposed anesthesia with the patient or authorized representative who has indicated his/her understanding and acceptance.     Plan Discussed with: CRNA and Surgeon  Anesthesia Plan Comments:         Anesthesia Quick Evaluation

## 2014-10-11 NOTE — H&P (Signed)
Subjective: Patient is a 63 y.o. female who is admitted for treatment of advanced lumbar spondylosis and degenerative disc disease, with resulting severe right lumbar radiculopathy with weakness of the right dorsiflexor and EHL, secondary to severe right L5-S1 neural foraminal stenosis, contributed to by hypertrophic facet arthropathy and degenerative disc protrusion. Patient has had symptoms for over a decade. They've worsened over the past few years. She's been treated with a variety of NSAIDs, two 6 week courses of physical therapy in 2014 and 2015, numerous spinal injections, and more recently gabapentin without relief. She is developed weakness over the past year. She is admitted now for a L5-S1 lumbar decompression, including bilateral L5-S1 laminectomy, facetectomy, and foraminotomy, bilateral L5-S1 posterior lumbar interbody arthrodesis with interbody implants and bone graft, and bilateral L5-S1 posterior lateral arthrodesis with posterior instrumentation and bone graft.   Past Medical History  Diagnosis Date  . Hyperlipidemia     takes Simvastatin daily  . History of blood clots 10 yrs ago    left calf after a bone break  . Seasonal allergies     takes Claritin daily;Nasonex and ProAir as needed;Singulair daily  . History of bronchitis 2014  . Weakness     numbness and tingling in right leg  . Arthritis   . Joint pain   . Chronic back pain     stenosis  . GERD (gastroesophageal reflux disease)     takes Omeprazole daily   . Diarrhea     occasionally  . History of colon polyps     benign  . Anxiety     takes Zoloft daily;takes Clonazepam daily as needed  . Insomnia     takes Melatonin nightly    Past Surgical History  Procedure Laterality Date  . Tonsillectomy    . Anal fissure repair    . Wrist surgery Right     with plate  . Colonosocpy    . Esophagogastroduodenoscopy      with ED  . D&c of bladder  73yrs ago    Prescriptions prior to admission  Medication Sig  Dispense Refill Last Dose  . albuterol (PROVENTIL HFA;VENTOLIN HFA) 108 (90 BASE) MCG/ACT inhaler Inhale 2 puffs into the lungs every 4 (four) hours as needed for wheezing or shortness of breath.   10/11/2014 at 0430  . clonazePAM (KLONOPIN) 0.5 MG tablet Take 0.5 mg by mouth daily as needed for anxiety.   10/11/2014 at 0430  . EPINEPHrine (EPIPEN 2-PAK) 0.3 mg/0.3 mL IJ SOAJ injection 0.3 mg once. asd irected     . gabapentin (NEURONTIN) 300 MG capsule Take 300 mg by mouth 3 (three) times daily.   10/11/2014 at 0430  . loratadine (CLARITIN) 10 MG tablet Take 10 mg by mouth daily.   10/11/2014 at 0430  . Melatonin 3 MG TABS Take 3 mg by mouth at bedtime.   10/10/2014 at Unknown time  . mometasone (NASONEX) 50 MCG/ACT nasal spray Place 1 spray into the nose 2 (two) times daily as needed (for allergies).   Past Week at Unknown time  . montelukast (SINGULAIR) 10 MG tablet Take 10 mg by mouth every morning.   10/10/2014 at Unknown time  . omeprazole (PRILOSEC) 20 MG capsule Take 20 mg by mouth daily.   10/11/2014 at 0430  . sertraline (ZOLOFT) 100 MG tablet Take 150 mg by mouth daily.   10/11/2014 at 0430  . simvastatin (ZOCOR) 20 MG tablet Take 20 mg by mouth at bedtime.   10/10/2014 at Unknown time  .  ibuprofen (ADVIL,MOTRIN) 200 MG tablet Take 400 mg by mouth 3 (three) times daily as needed.   More than a month at Unknown time  . temazepam (RESTORIL) 30 MG capsule Take 30 mg by mouth at bedtime as needed for sleep.    More than a month at Unknown time   Allergies  Allergen Reactions  . Other     Shellfish-Anaphylaxis  . Codeine     nausea    History  Substance Use Topics  . Smoking status: Never Smoker   . Smokeless tobacco: Not on file  . Alcohol Use: Yes     Comment: wine daily    History reviewed. No pertinent family history.   Review of Systems A comprehensive review of systems was negative.  Objective: Vital signs in last 24 hours: Temp:  [97.8 F (36.6 C)] 97.8 F (36.6 C)  (12/10 0610) Pulse Rate:  [75] 75 (12/10 0610) Resp:  [20] 20 (12/10 0610) BP: (154)/(78) 154/78 mmHg (12/10 0610) SpO2:  [94 %] 94 % (12/10 0610) Weight:  [92.987 kg (205 lb)] 92.987 kg (205 lb) (12/10 0610)  EXAM: Patient well-developed, well-nourished white female in no acute distress. Lungs are clear to auscultation , the patient has symmetrical respiratory excursion. Heart has a regular rate and rhythm normal S1 and S2 no murmur.   Abdomen is soft nontender nondistended bowel sounds are present. Extremity examination shows no clubbing cyanosis or edema. Neurologic examination shows 5/5 strength in the iliopsoas, quadriceps, and plantar flexor bilaterally, as well as in the left dorsiflexor and EHL, however the right dorsiflexor is 4/5 in the left EHL is 3/5. Sensation is decreased to pinprick in the right leg and foot, as compared to the left leg and foot. Reflexes are 2 at the quadriceps and gastrocnemius. They're symmetrical bilaterally. Toes are downgoing bilaterally. Her gait and stance favor the right lower extremity.  Data Review:CBC    Component Value Date/Time   WBC 7.3 10/03/2014 1027   RBC 4.86 10/03/2014 1027   HGB 14.2 10/03/2014 1027   HCT 41.9 10/03/2014 1027   PLT 256 10/03/2014 1027   MCV 86.2 10/03/2014 1027   MCH 29.2 10/03/2014 1027   MCHC 33.9 10/03/2014 1027   RDW 12.8 10/03/2014 1027                          BMET    Component Value Date/Time   NA 137 10/03/2014 1027   K 4.5 10/03/2014 1027   CL 100 10/03/2014 1027   CO2 23 10/03/2014 1027   GLUCOSE 92 10/03/2014 1027   BUN 18 10/03/2014 1027   CREATININE 0.63 10/03/2014 1027   CALCIUM 9.0 10/03/2014 1027   GFRNONAA >90 10/03/2014 1027   GFRAA >90 10/03/2014 1027     Assessment/Plan: Patient with increasingly disabling right lumbar radiculopathy, that has developed weakness over the past year or so, that has undergone extensive nonsurgical treatment without improvement. She is admitted now for an  L5-S1 lumbar decompression and arthrodesis.  I've discussed with the patient the nature of his condition, the nature the surgical procedure, the typical length of surgery, hospital stay, and overall recuperation, the limitations postoperatively, and risks of surgery. I discussed risks including risks of infection, bleeding, possibly need for transfusion, the risk of nerve root dysfunction with pain, weakness, numbness, or paresthesias, the risk of dural tear and CSF leakage and possible need for further surgery, the risk of failure of the arthrodesis and possibly for  further surgery, the risk of anesthetic complications including myocardial infarction, stroke, pneumonia, and death. We discussed the need for postoperative immobilization in a lumbar brace. Understanding all this the patient does wish to proceed with surgery and is admitted for such.     Hewitt ShortsNUDELMAN,ROBERT W, MD 10/11/2014 6:49 AM

## 2014-10-11 NOTE — Transfer of Care (Signed)
Immediate Anesthesia Transfer of Care Note  Patient: Ashley Craig  Procedure(s) Performed: Procedure(s) with comments: LUMBAR FIVE TO SACRAL ONE POSTERIOR LUMBAR FUSION 1 LEVEL (N/A) - L5S1 decompression with posterior lumbar interbody fusion with interbody prosthesis posterior lateral arthrodesis and posterior nonsegmental instrumentation  Patient Location: PACU  Anesthesia Type:General  Level of Consciousness: awake and alert   Airway & Oxygen Therapy: Patient Spontanous Breathing and Patient connected to nasal cannula oxygen  Post-op Assessment: Report given to PACU RN, Post -op Vital signs reviewed and stable and Patient moving all extremities  Post vital signs: Reviewed and stable  Complications: No apparent anesthesia complications

## 2014-10-11 NOTE — Op Note (Signed)
10/11/2014  12:11 PM  PATIENT:  Ashley NajjarJudy V Arbogast  63 y.o. female  PRE-OPERATIVE DIAGNOSIS:  L5-S1 lumbar stenosis with neurogenic claudication, lumbar spondylosis, lumbar degenerative disease, lumbar disc herniation, lumbar radiculopathy  POST-OPERATIVE DIAGNOSIS: L5-S1 lumbar stenosis with neurogenic claudication, lumbar spondylosis, lumbar degenerative disease, lumbar disc herniation, lumbar radiculopathy  PROCEDURE:  Procedure(s):  Bilateral L5-S1 lumbar decompression including laminectomy, complete facetectomy, and foraminotomy, with decompression beyond that required for interbody arthrodesis, with decompression of canal and neural foraminal stenosis, with decompression of the thecal sac, and bilateral L5 and S1 nerve roots; bilateral L5-S1 posterior lumbar interbody arthrodesis with AVS peek interbody implants, Vitoss BA with bone marrow aspirate, and infuse; bilateral L5-S1 posterior lateral arthrodesis with nonsegmental radius posterior instrumentation, Vitoss BA with bone marrow aspirate, and infuse  SURGEON:  Surgeon(s): Hewitt Shortsobert W Nudelman, MD Maeola HarmanJoseph Stern, MD  ASSISTANTS: Maeola HarmanJoseph Stern, M.D.  ANESTHESIA:   general  EBL:  Total I/O In: 2200 [I.V.:2200] Out: 380 [Urine:230; Blood:150]  BLOOD ADMINISTERED:none  CELL SAVER GIVEN:  Cell Saver technician felt that there was insufficient blood loss to process to collect the blood  COUNT: Correct per nursing staff  DICTATION: Patient is brought to the operating room placed under general endotracheal anesthesia. The patient was turned to prone position the lumbar region was prepped with Betadine soap and solution and draped in a sterile fashion. The midline was infiltrated with local anesthesia with epinephrine. A localizing x-ray was taken and then a midline incision was made carried down through the subcutaneous tissue, bipolar cautery and electrocautery were used to maintain hemostasis. Dissection was carried down to the lumbar  fascia. The fascia was incised bilaterally and the paraspinal muscles were dissected with a spinous process and lamina in a subperiosteal fashion. Another x-ray was taken for localization and the L5-S1 level was localized. Dissection was then carried out laterally over the facet complex and the transverse processes of L5 and S1 were exposed and decorticated. Decompression was begun with bilateral L5 and S1 laminotomies, using the high-speed drill and Kerrison punches. Decompression was carried out laterally including facetectomy and foraminotomies with decompression of the stenotic compression of the L5 and S1 nerve roots bilaterally.  As we expected from the MRI findings, we encountered severe stenosis of the right L5-S1 neural foramen, and a complete facetectomy was performed, decompressing the right L5 nerve root through its entire exiting extent. Once the decompression stenotic compression of the thecal sac and exiting nerve roots was completed we proceeded with the posterior lumbar interbody arthrodesis. The annulus was incised bilaterally and the disc space entered. A thorough discectomy was performed using pituitary rongeurs and curettes. Once the discectomy was completed we began to prepare the endplate surfaces removing the cartilaginous endplates surface. We then measured the height of the intervertebral disc space. We selected 8 x 25 x 4 AVS peek interbody implants.  The C-arm fluoroscope was then draped and brought in the field and we identified the pedicle entry points bilaterally at the L5 and S1 levels. Each of the 4 pedicles was probed, we aspirated bone marrow aspirate from the vertebral bodies, this was injected over a 10 cc and a 5 cc strips of Vitoss BA. Then each of the pedicles was examined with the ball probe good bony surfaces were found and no bony cuts were found. Each of the pedicles was then tapped with a 5.25 mm tap, again examined with the ball probe good threading was found and no  bony cuts were found. We then placed  5.75 by 40 millimeter screws bilaterally at the S1 level and a left sided L5.  A 5.75 x 45 mm screw was placed on the right side at L5.  We then packed the AVS peek interbody implants with Vitoss BA with bone marrow aspirate and infuse, and then placed the first implant on the right side, carefully retracting the thecal sac and nerve root medially. We then went back to the left side and packed the midline with additional Vitoss BA with bone marrow aspirate and infuse, and then placed a second implant and on the left side again retracting the thecal sac and nerve root medially. Additional Vitoss BA with bone marrow aspirate and infuse was packed lateral to the implants.  We then packed the lateral gutter over the transverse processes and intertransverse space with Vitoss BA with bone marrow aspirate and infuse. We then selected pre-lordosed rods, use a 35 mm rod on the left and a 30 mm rod on the right.  They were placed within the screw heads and secured with locking caps once all 4 locking caps were placed final tightening was performed against a counter torque.  The wound had been irrigated multiple times during the procedure with saline solution and bacitracin solution, good hemostasis was established with a combination of bipolar cautery and Gelfoam with thrombin. Once good hemostasis was confirmed we proceeded with closure paraspinal muscles deep fascia and Scarpa's fascia were closed with interrupted undyed 1 Vicryl sutures the subcutaneous and subcuticular closed with interrupted inverted 2-0 undyed Vicryl sutures the skin edges were approximated with skin closure adhesive. Once the adhesive was dry, a dressing of sterile gauze and Hypafix was applied area  Following surgery the patient was turned back to the supine position to be reversed and the anesthetic extubated and transferred to the recovery room for further care.  PLAN OF CARE: Admit to inpatient    PATIENT DISPOSITION:  PACU - hemodynamically stable.   Delay start of Pharmacological VTE agent (>24hrs) due to surgical blood loss or risk of bleeding:  yes

## 2014-10-11 NOTE — Progress Notes (Signed)
Filed Vitals:   10/11/14 1400 10/11/14 1415 10/11/14 1448 10/11/14 1657  BP: 135/78 136/90 134/68 111/82  Pulse: 76 79 81 77  Temp:  97.6 F (36.4 C) 98 F (36.7 C) 98 F (36.7 C)  TempSrc:      Resp: 21 15 16 16   Height:      Weight:      SpO2: 85% 98% 99% 95%    Patient sitting up on the side of the bed, eating. Has walked over 200 feet so far. Dressing clean and dry. Voiding (Foley removed in PACU).  Plan: Doing well following surgery today. Encouraged to ambulate at least 1 or 2 more times this evening.  Hewitt ShortsNUDELMAN,ROBERT W, MD 10/11/2014, 6:04 PM

## 2014-10-11 NOTE — Anesthesia Procedure Notes (Signed)
Procedure Name: Intubation Date/Time: 10/11/2014 7:41 AM Performed by: Ashley CaprioAUSTON, Ashley Craig Pre-anesthesia Checklist: Patient identified, Timeout performed, Emergency Drugs available, Suction available and Patient being monitored Patient Re-evaluated:Patient Re-evaluated prior to inductionOxygen Delivery Method: Circle system utilized Preoxygenation: Pre-oxygenation with 100% oxygen Intubation Type: IV induction Ventilation: Mask ventilation without difficulty Laryngoscope Size: Miller and 2 Grade View: Grade I Tube type: Oral Tube size: 7.5 mm Number of attempts: 1 Placement Confirmation: ETT inserted through vocal cords under direct vision,  breath sounds checked- equal and bilateral and positive ETCO2 Secured at: 21 cm Tube secured with: Tape Dental Injury: Teeth and Oropharynx as per pre-operative assessment

## 2014-10-12 MED ORDER — OXYCODONE-ACETAMINOPHEN 5-325 MG PO TABS: 1.0000 | ORAL_TABLET | ORAL | Status: DC | PRN

## 2014-10-12 NOTE — Discharge Summary (Signed)
Physician Discharge Summary  Patient ID: Ashley Craig MRN: [id number] DOB/AGE: 61/11/1950 61 y.o.  Admit date: 10/11/2014 Discharge date: 10/12/2014  Admission Diagnoses:  L5-S1 lumbar stenosis with neurogenic claudication, lumbar spondylosis, lumbar degenerative disease, lumbar disc herniation, lumbar radiculopathy  Discharge Diagnoses:  L5-S1 lumbar stenosis with neurogenic claudication, lumbar spondylosis, lumbar degenerative disease, lumbar disc herniation, lumbar radiculopathy  Active Problems:   Lumbar stenosis with neurogenic claudication   Discharged Condition: good  Hospital Course: Patient was admitted, underwent a bilateral L5-S1 lumbar decompression, PLIF, and PLA.  She is done well following surgery. She is up and ambulate actively. She is voiding well. We are discharging her to home with instructions regarding wound care and activities. She is to return for follow-up with me in 3 weeks.  Discharge Exam: Blood pressure 106/56, pulse 77, temperature 98.6 F (37 C), temperature source Oral, resp. rate 20, height 5\' 7"  (1.702 m), weight 92.987 kg (205 lb), SpO2 98 %.  Disposition: Home     Medication List    TAKE these medications        albuterol 108 (90 BASE) MCG/ACT inhaler  Commonly known as:  PROVENTIL HFA;VENTOLIN HFA  Inhale 2 puffs into the lungs every 4 (four) hours as needed for wheezing or shortness of breath.     CLARITIN 10 MG tablet  Generic drug:  loratadine  Take 10 mg by mouth daily.     clonazePAM 0.5 MG tablet  Commonly known as:  KLONOPIN  Take 0.5 mg by mouth daily as needed for anxiety.     EPIPEN 2-PAK 0.3 mg/0.3 mL Soaj injection  Generic drug:  EPINEPHrine  0.3 mg once. asd irected     gabapentin 300 MG capsule  Commonly known as:  NEURONTIN  Take 300 mg by mouth 3 (three) times daily.     ibuprofen 200 MG tablet  Commonly known as:  ADVIL,MOTRIN  Take 400 mg by mouth 3 (three) times daily as needed.     Melatonin 3 MG Tabs   Take 3 mg by mouth at bedtime.     mometasone 50 MCG/ACT nasal spray  Commonly known as:  NASONEX  Place 1 spray into the nose 2 (two) times daily as needed (for allergies).     montelukast 10 MG tablet  Commonly known as:  SINGULAIR  Take 10 mg by mouth every morning.     omeprazole 20 MG capsule  Commonly known as:  PRILOSEC  Take 20 mg by mouth daily.     oxyCODONE-acetaminophen 5-325 MG per tablet  Commonly known as:  PERCOCET/ROXICET  Take 1-2 tablets by mouth every 4 (four) hours as needed for moderate pain.     sertraline 100 MG tablet  Commonly known as:  ZOLOFT  Take 150 mg by mouth daily.     simvastatin 20 MG tablet  Commonly known as:  ZOCOR  Take 20 mg by mouth at bedtime.     temazepam 30 MG capsule  Commonly known as:  RESTORIL  Take 30 mg by mouth at bedtime as needed for sleep.         Signed: Hewitt ShortsNUDELMAN,ROBERT W, MD 10/12/2014, 8:20 AM

## 2014-10-12 NOTE — Progress Notes (Signed)
Pt doing well. Pt and sister given D/C instructions with Rx, verbal understanding was provided. Pt's incision is open to air and is clean and dry with no sign of infection. Pt's IV was removed prior to D/C. Pt D/C'd home via wheelchair @ 1030 per MD order. Pt is stable @ D/C and has no other needs at this time. Rema FendtAshley Marelin Tat, RN

## 2014-10-15 MED FILL — Heparin Sodium (Porcine) Inj 1000 Unit/ML: INTRAMUSCULAR | Qty: 30 | Status: AC

## 2014-10-15 MED FILL — Sodium Chloride IV Soln 0.9%: INTRAVENOUS | Qty: 1000 | Status: AC

## 2014-10-27 ENCOUNTER — Encounter (HOSPITAL_BASED_OUTPATIENT_CLINIC_OR_DEPARTMENT_OTHER): Payer: Self-pay | Admitting: *Deleted

## 2014-10-27 ENCOUNTER — Emergency Department (HOSPITAL_BASED_OUTPATIENT_CLINIC_OR_DEPARTMENT_OTHER)
Admission: EM | Admit: 2014-10-27 | Discharge: 2014-10-27 | Disposition: A | Discharge status: Home / Self Care | Payer: BC Managed Care – PPO | Attending: Emergency Medicine | Admitting: Emergency Medicine

## 2014-10-27 DIAGNOSIS — E785 Hyperlipidemia, unspecified: Secondary | ICD-10-CM | POA: Insufficient documentation

## 2014-10-27 DIAGNOSIS — K219 Gastro-esophageal reflux disease without esophagitis: Secondary | ICD-10-CM | POA: Insufficient documentation

## 2014-10-27 DIAGNOSIS — M549 Dorsalgia, unspecified: Secondary | ICD-10-CM | POA: Diagnosis not present

## 2014-10-27 DIAGNOSIS — M1990 Unspecified osteoarthritis, unspecified site: Secondary | ICD-10-CM | POA: Diagnosis not present

## 2014-10-27 DIAGNOSIS — F419 Anxiety disorder, unspecified: Secondary | ICD-10-CM | POA: Insufficient documentation

## 2014-10-27 DIAGNOSIS — Z86010 Personal history of colonic polyps: Secondary | ICD-10-CM | POA: Diagnosis not present

## 2014-10-27 DIAGNOSIS — G8929 Other chronic pain: Secondary | ICD-10-CM | POA: Insufficient documentation

## 2014-10-27 DIAGNOSIS — Z981 Arthrodesis status: Secondary | ICD-10-CM | POA: Insufficient documentation

## 2014-10-27 DIAGNOSIS — Z760 Encounter for issue of repeat prescription: Secondary | ICD-10-CM | POA: Diagnosis not present

## 2014-10-27 DIAGNOSIS — Z79899 Other long term (current) drug therapy: Secondary | ICD-10-CM | POA: Diagnosis not present

## 2014-10-27 DIAGNOSIS — G4700 Insomnia, unspecified: Secondary | ICD-10-CM | POA: Insufficient documentation

## 2014-10-27 MED ORDER — OXYCODONE-ACETAMINOPHEN 5-325 MG PO TABS: 1.0000 | ORAL_TABLET | ORAL | Status: DC | PRN

## 2014-10-27 MED ORDER — OXYCODONE-ACETAMINOPHEN 5-325 MG PO TABS
2.0000 | ORAL_TABLET | Freq: Once | ORAL | Status: AC
  Administered 2014-10-27: 2 via ORAL
  Filled 2014-10-27: qty 2

## 2014-10-27 NOTE — ED Provider Notes (Signed)
CSN: [id number]     Arrival date & time 10/27/14  1059 History   First MD Initiated Contact with Patient 10/27/14 1201     Chief Complaint  Patient presents with  . Medication Refill     (Consider location/radiation/quality/duration/timing/severity/associated sxs/prior Treatment) The history is provided by the patient and medical records.    This is a 63 y.o. F with PMH significant for HLP, GERD, anxiety, presenting to the ED requesting medication refill.  Patient is s/p spinal fusion on 10/11/14 by Dr. Newell CoralNudelman.  States surgery went well without noted complications.  She has been recovering well and has been able to exercise daily.  States she was doing so well that they decreased her meds to vicodin at recent check-up, but now is having to take increased meds and pain is not well controlled.  Last vicodin was taken yesterday afternoon.  States her low back "aches" and some pain into BLE.  Denies numbness, weakness, or paresthesias of LE.  No loss of bowel or bladder control.  No fever, chills, sweats, urinary symptoms.  Patient called Nudelman's office and told to come to ER or urgent care for meds refills until Monday.  Past Medical History  Diagnosis Date  . Hyperlipidemia     takes Simvastatin daily  . History of blood clots 10 yrs ago    left calf after a bone break  . Seasonal allergies     takes Claritin daily;Nasonex and ProAir as needed;Singulair daily  . History of bronchitis 2014  . Weakness     numbness and tingling in right leg  . Arthritis   . Joint pain   . Chronic back pain     stenosis  . GERD (gastroesophageal reflux disease)     takes Omeprazole daily   . Diarrhea     occasionally  . History of colon polyps     benign  . Anxiety     takes Zoloft daily;takes Clonazepam daily as needed  . Insomnia     takes Melatonin nightly   Past Surgical History  Procedure Laterality Date  . Tonsillectomy    . Anal fissure repair    . Wrist surgery Right     with  plate  . Colonosocpy    . Esophagogastroduodenoscopy      with ED  . D&c of bladder  7792yrs ago   History reviewed. No pertinent family history. History  Substance Use Topics  . Smoking status: Never Smoker   . Smokeless tobacco: Not on file  . Alcohol Use: Yes     Comment: wine daily   OB History    No data available     Review of Systems  Musculoskeletal: Positive for back pain.  All other systems reviewed and are negative.     Allergies  Other and Codeine  Home Medications   Prior to Admission medications   Medication Sig Start Date End Date Taking? Authorizing Provider  HYDROcodone-acetaminophen (NORCO/VICODIN) 5-325 MG per tablet Take 1 tablet by mouth every 6 (six) hours as needed for moderate pain.   Yes Historical Provider, MD  albuterol (PROVENTIL HFA;VENTOLIN HFA) 108 (90 BASE) MCG/ACT inhaler Inhale 2 puffs into the lungs every 4 (four) hours as needed for wheezing or shortness of breath.    Historical Provider, MD  clonazePAM (KLONOPIN) 0.5 MG tablet Take 0.5 mg by mouth daily as needed for anxiety.    Historical Provider, MD  EPINEPHrine (EPIPEN 2-PAK) 0.3 mg/0.3 mL IJ SOAJ injection 0.3 mg once. asd  irected    Historical Provider, MD  gabapentin (NEURONTIN) 300 MG capsule Take 300 mg by mouth 3 (three) times daily.    Historical Provider, MD  ibuprofen (ADVIL,MOTRIN) 200 MG tablet Take 400 mg by mouth 3 (three) times daily as needed.    Historical Provider, MD  loratadine (CLARITIN) 10 MG tablet Take 10 mg by mouth daily.    Historical Provider, MD  Melatonin 3 MG TABS Take 3 mg by mouth at bedtime.    Historical Provider, MD  mometasone (NASONEX) 50 MCG/ACT nasal spray Place 1 spray into the nose 2 (two) times daily as needed (for allergies).    Historical Provider, MD  montelukast (SINGULAIR) 10 MG tablet Take 10 mg by mouth every morning.    Historical Provider, MD  omeprazole (PRILOSEC) 20 MG capsule Take 20 mg by mouth daily.    Historical Provider, MD   oxyCODONE-acetaminophen (PERCOCET/ROXICET) 5-325 MG per tablet Take 1-2 tablets by mouth every 4 (four) hours as needed for moderate pain. 10/12/14   Hewitt Shortsobert W Nudelman, MD  sertraline (ZOLOFT) 100 MG tablet Take 150 mg by mouth daily.    Historical Provider, MD  simvastatin (ZOCOR) 20 MG tablet Take 20 mg by mouth at bedtime.    Historical Provider, MD  temazepam (RESTORIL) 30 MG capsule Take 30 mg by mouth at bedtime as needed for sleep.     Historical Provider, MD   BP 167/84 mmHg  Pulse 73  Temp(Src) 98.5 F (36.9 C) (Oral)  Resp 18  Ht 5\' 7"  (1.702 m)  Wt 202 lb (91.627 kg)  BMI 31.63 kg/m2  SpO2 96% Physical Exam  Constitutional: She is oriented to person, place, and time. She appears well-developed and well-nourished.  HENT:  Head: Normocephalic and atraumatic.  Mouth/Throat: Oropharynx is clear and moist.  Eyes: Conjunctivae and EOM are normal. Pupils are equal, round, and reactive to light.  Neck: Normal range of motion.  Cardiovascular: Normal rate, regular rhythm and normal heart sounds.   Pulmonary/Chest: Effort normal and breath sounds normal.  Abdominal: Soft. Bowel sounds are normal.  Musculoskeletal: Normal range of motion.  Midline lumbar incision healing well without signs of infection, no bony deformities, normal strength and sensation of bilateral lower extremities, ambulating unassisted without difficulty.  Neurological: She is alert and oriented to person, place, and time.  Skin: Skin is warm and dry.  Psychiatric: She has a normal mood and affect.  Nursing note and vitals reviewed.   ED Course  Procedures (including critical care time) Labs Review Labs Reviewed - No data to display  Imaging Review No results found.   EKG Interpretation None      MDM   Final diagnoses:  Medication refill  Back pain, unspecified location   63 year old female with increased back pain after running out of pain medication yesterday. On exam, surgical site healing  well without signs of infection. Her neurologic exam is nonfocal. Short supply of Percocet given. Encouraged follow-up with neurosurgeon on Monday.  Discussed plan with patient, he/she acknowledged understanding and agreed with plan of care.  Return precautions given for new or worsening symptoms.   Garlon HatchetLisa M Chriss Redel, PA-C 10/27/14 1258  Joya Gaskinsonald W Wickline, MD 10/27/14 [phone]

## 2014-10-27 NOTE — Discharge Instructions (Signed)
Take the prescribed medication as directed. Follow-up with Dr. Newell CoralNudelman. Return to the ED for new or worsening symptoms.

## 2014-10-27 NOTE — ED Notes (Signed)
Spinal fusion on 12/10 and continuing to have pain at surgical site. Patient sent by nudelman's office for additional percocet until she can MD first of week.

## 2014-10-27 NOTE — ED Notes (Addendum)
Pt is requesting refill on pain meds vicodin or percocet  , Spinal fusion dec 10th received vicodin 60 tabs on dec19th

## 2014-10-31 ENCOUNTER — Emergency Department (HOSPITAL_BASED_OUTPATIENT_CLINIC_OR_DEPARTMENT_OTHER)
Admission: EM | Admit: 2014-10-31 | Discharge: 2014-10-31 | Disposition: A | Discharge status: Home / Self Care | Payer: BC Managed Care – PPO | Attending: Emergency Medicine | Admitting: Emergency Medicine

## 2014-10-31 ENCOUNTER — Encounter (HOSPITAL_BASED_OUTPATIENT_CLINIC_OR_DEPARTMENT_OTHER): Payer: Self-pay | Admitting: Emergency Medicine

## 2014-10-31 DIAGNOSIS — G8929 Other chronic pain: Secondary | ICD-10-CM | POA: Insufficient documentation

## 2014-10-31 DIAGNOSIS — Z86010 Personal history of colonic polyps: Secondary | ICD-10-CM | POA: Diagnosis not present

## 2014-10-31 DIAGNOSIS — F419 Anxiety disorder, unspecified: Secondary | ICD-10-CM | POA: Insufficient documentation

## 2014-10-31 DIAGNOSIS — E785 Hyperlipidemia, unspecified: Secondary | ICD-10-CM | POA: Diagnosis not present

## 2014-10-31 DIAGNOSIS — Z86718 Personal history of other venous thrombosis and embolism: Secondary | ICD-10-CM | POA: Insufficient documentation

## 2014-10-31 DIAGNOSIS — K219 Gastro-esophageal reflux disease without esophagitis: Secondary | ICD-10-CM | POA: Diagnosis not present

## 2014-10-31 DIAGNOSIS — Z7952 Long term (current) use of systemic steroids: Secondary | ICD-10-CM | POA: Diagnosis not present

## 2014-10-31 DIAGNOSIS — M1990 Unspecified osteoarthritis, unspecified site: Secondary | ICD-10-CM | POA: Diagnosis not present

## 2014-10-31 DIAGNOSIS — Z79899 Other long term (current) drug therapy: Secondary | ICD-10-CM | POA: Diagnosis not present

## 2014-10-31 DIAGNOSIS — Z8709 Personal history of other diseases of the respiratory system: Secondary | ICD-10-CM | POA: Insufficient documentation

## 2014-10-31 DIAGNOSIS — M7981 Nontraumatic hematoma of soft tissue: Secondary | ICD-10-CM | POA: Diagnosis not present

## 2014-10-31 DIAGNOSIS — T148XXA Other injury of unspecified body region, initial encounter: Secondary | ICD-10-CM

## 2014-10-31 NOTE — ED Notes (Signed)
MD at bedside. 

## 2014-10-31 NOTE — ED Provider Notes (Signed)
CSN: [id number]     Arrival date & time 10/31/14  1450 History   First MD Initiated Contact with Patient 10/31/14 1525     Chief Complaint  Patient presents with  . Insect Bite      HPI 24-36 hours ago patient felt a burning sensation to her left lateral ankle.  Noticed immediate large goose egg over the ankle.  Since then the swelling has started going down but she's noted some discoloration.  She's had no significant pain.  No fever no chills no red streaks.  She does bruise easily. Past Medical History  Diagnosis Date  . Hyperlipidemia     takes Simvastatin daily  . History of blood clots 10 yrs ago    left calf after a bone break  . Seasonal allergies     takes Claritin daily;Nasonex and ProAir as needed;Singulair daily  . History of bronchitis 2014  . Weakness     numbness and tingling in right leg  . Arthritis   . Joint pain   . Chronic back pain     stenosis  . GERD (gastroesophageal reflux disease)     takes Omeprazole daily   . Diarrhea     occasionally  . History of colon polyps     benign  . Anxiety     takes Zoloft daily;takes Clonazepam daily as needed  . Insomnia     takes Melatonin nightly   Past Surgical History  Procedure Laterality Date  . Tonsillectomy    . Anal fissure repair    . Wrist surgery Right     with plate  . Colonosocpy    . Esophagogastroduodenoscopy      with ED  . D&c of bladder  613yrs ago   No family history on file. History  Substance Use Topics  . Smoking status: Never Smoker   . Smokeless tobacco: Not on file  . Alcohol Use: Yes     Comment: wine daily   OB History    No data available     Review of Systems  All other systems reviewed and are negative.     Allergies  Other and Codeine  Home Medications   Prior to Admission medications   Medication Sig Start Date End Date Taking? Authorizing Provider  albuterol (PROVENTIL HFA;VENTOLIN HFA) 108 (90 BASE) MCG/ACT inhaler Inhale 2 puffs into the lungs every 4  (four) hours as needed for wheezing or shortness of breath.    Historical Provider, MD  clonazePAM (KLONOPIN) 0.5 MG tablet Take 0.5 mg by mouth daily as needed for anxiety.    Historical Provider, MD  EPINEPHrine (EPIPEN 2-PAK) 0.3 mg/0.3 mL IJ SOAJ injection 0.3 mg once. asd irected    Historical Provider, MD  gabapentin (NEURONTIN) 300 MG capsule Take 300 mg by mouth 3 (three) times daily.    Historical Provider, MD  HYDROcodone-acetaminophen (NORCO/VICODIN) 5-325 MG per tablet Take 1 tablet by mouth every 6 (six) hours as needed for moderate pain.    Historical Provider, MD  ibuprofen (ADVIL,MOTRIN) 200 MG tablet Take 400 mg by mouth 3 (three) times daily as needed.    Historical Provider, MD  loratadine (CLARITIN) 10 MG tablet Take 10 mg by mouth daily.    Historical Provider, MD  Melatonin 3 MG TABS Take 3 mg by mouth at bedtime.    Historical Provider, MD  mometasone (NASONEX) 50 MCG/ACT nasal spray Place 1 spray into the nose 2 (two) times daily as needed (for allergies).  Historical Provider, MD  montelukast (SINGULAIR) 10 MG tablet Take 10 mg by mouth every morning.    Historical Provider, MD  omeprazole (PRILOSEC) 20 MG capsule Take 20 mg by mouth daily.    Historical Provider, MD  oxyCODONE-acetaminophen (PERCOCET/ROXICET) 5-325 MG per tablet Take 1-2 tablets by mouth every 4 (four) hours as needed for moderate pain. 10/12/14   Hewitt Shortsobert W Nudelman, MD  oxyCODONE-acetaminophen (PERCOCET/ROXICET) 5-325 MG per tablet Take 1 tablet by mouth every 4 (four) hours as needed. 10/27/14   Garlon HatchetLisa M Sanders, PA-C  sertraline (ZOLOFT) 100 MG tablet Take 150 mg by mouth daily.    Historical Provider, MD  simvastatin (ZOCOR) 20 MG tablet Take 20 mg by mouth at bedtime.    Historical Provider, MD  temazepam (RESTORIL) 30 MG capsule Take 30 mg by mouth at bedtime as needed for sleep.     Historical Provider, MD   BP 157/95 mmHg  Pulse 78  Temp(Src) 98.4 F (36.9 C) (Oral)  Resp 18  Ht 5\' 7"  (1.702 m)   Wt 192 lb (87.091 kg)  BMI 30.06 kg/m2  SpO2 98% Physical Exam Physical Exam  Nursing note and vitals reviewed. Constitutional: She is oriented to person, place, and time. She appears well-developed and well-nourished. No distress.  HENT:  Head: Normocephalic and atraumatic.  Eyes: Pupils are equal, round, and reactive to light.  Neck: Normal range of motion.  Pulmonary/Chest: No respiratory distress.  Abdominal: Normal appearance. She exhibits no distension.  Musculoskeletal: Normal range of motion.  Very superficial abrasion noted lateral malleolus.   left lateral ankle with resolving hematoma.  No evidence of cellulitis.  Good pulses both dorsalis pedis and posterior tibial.  No evidence of Achilles injury. Neurological: She is alert and oriented to person, place, and time. No cranial nerve deficit.  Skin: Skin is warm and dry. No rash noted.  Psychiatric: She has a normal mood and affect. Her behavior is normal.   ED Course  Procedures (including critical care time)  I suspect patient had some incidental minor trauma with abrasion suffered a hematoma.  The exam is consistent with a resolving hematoma. Labs Review Labs Reviewed - No data to display    MDM   Final diagnoses:  Hematoma        Nelia Shiobert L Oluwasemilore Bahl, MD 10/31/14 [phone]

## 2014-10-31 NOTE — ED Notes (Signed)
Insect bite to left outer ankle.  Noted swelling and bruising.

## 2014-10-31 NOTE — Discharge Instructions (Signed)

## 2014-11-30 DIAGNOSIS — R233 Spontaneous ecchymoses: Secondary | ICD-10-CM | POA: Insufficient documentation

## 2015-01-15 DIAGNOSIS — J0110 Acute frontal sinusitis, unspecified: Secondary | ICD-10-CM | POA: Insufficient documentation

## 2015-05-21 ENCOUNTER — Other Ambulatory Visit: Payer: Self-pay

## 2015-05-21 DIAGNOSIS — Z1231 Encounter for screening mammogram for malignant neoplasm of breast: Secondary | ICD-10-CM

## 2015-06-25 ENCOUNTER — Ambulatory Visit
Admission: RE | Admit: 2015-06-25 | Discharge: 2015-06-25 | Disposition: A | Discharge status: Home / Self Care | Payer: BLUE CROSS/BLUE SHIELD | Source: Ambulatory Visit

## 2015-06-25 DIAGNOSIS — Z1231 Encounter for screening mammogram for malignant neoplasm of breast: Secondary | ICD-10-CM

## 2015-07-11 DIAGNOSIS — J45909 Unspecified asthma, uncomplicated: Secondary | ICD-10-CM

## 2015-08-06 ENCOUNTER — Ambulatory Visit (INDEPENDENT_AMBULATORY_CARE_PROVIDER_SITE_OTHER): Payer: BLUE CROSS/BLUE SHIELD | Admitting: *Deleted

## 2015-08-06 DIAGNOSIS — J309 Allergic rhinitis, unspecified: Secondary | ICD-10-CM | POA: Diagnosis not present

## 2015-08-07 DIAGNOSIS — I872 Venous insufficiency (chronic) (peripheral): Secondary | ICD-10-CM | POA: Insufficient documentation

## 2015-08-19 ENCOUNTER — Ambulatory Visit (INDEPENDENT_AMBULATORY_CARE_PROVIDER_SITE_OTHER): Payer: BLUE CROSS/BLUE SHIELD

## 2015-08-19 DIAGNOSIS — J309 Allergic rhinitis, unspecified: Secondary | ICD-10-CM | POA: Diagnosis not present

## 2015-09-09 ENCOUNTER — Ambulatory Visit (INDEPENDENT_AMBULATORY_CARE_PROVIDER_SITE_OTHER): Payer: BLUE CROSS/BLUE SHIELD | Admitting: *Deleted

## 2015-09-09 DIAGNOSIS — J309 Allergic rhinitis, unspecified: Secondary | ICD-10-CM

## 2015-09-23 ENCOUNTER — Ambulatory Visit (INDEPENDENT_AMBULATORY_CARE_PROVIDER_SITE_OTHER): Payer: BLUE CROSS/BLUE SHIELD | Admitting: *Deleted

## 2015-09-23 DIAGNOSIS — J309 Allergic rhinitis, unspecified: Secondary | ICD-10-CM

## 2015-10-17 ENCOUNTER — Ambulatory Visit (INDEPENDENT_AMBULATORY_CARE_PROVIDER_SITE_OTHER): Payer: BLUE CROSS/BLUE SHIELD | Admitting: *Deleted

## 2015-10-17 DIAGNOSIS — J309 Allergic rhinitis, unspecified: Secondary | ICD-10-CM

## 2015-11-05 ENCOUNTER — Ambulatory Visit (INDEPENDENT_AMBULATORY_CARE_PROVIDER_SITE_OTHER): Payer: BLUE CROSS/BLUE SHIELD | Admitting: Pediatrics

## 2015-11-05 ENCOUNTER — Encounter: Payer: Self-pay | Admitting: Pediatrics

## 2015-11-05 VITALS — BP 128/82 | HR 72 | Temp 98.1°F | Resp 16 | Ht 67.0 in | Wt 207.2 lb

## 2015-11-05 DIAGNOSIS — K219 Gastro-esophageal reflux disease without esophagitis: Secondary | ICD-10-CM | POA: Diagnosis not present

## 2015-11-05 DIAGNOSIS — J4530 Mild persistent asthma, uncomplicated: Secondary | ICD-10-CM | POA: Diagnosis not present

## 2015-11-05 DIAGNOSIS — J3089 Other allergic rhinitis: Secondary | ICD-10-CM | POA: Diagnosis not present

## 2015-11-05 MED ORDER — MONTELUKAST SODIUM 10 MG PO TABS: 10.0000 mg | ORAL_TABLET | Freq: Every day | ORAL | Status: DC

## 2015-11-05 NOTE — Patient Instructions (Signed)
Continue on your current medications Add prednisone 20 mg twice a day for 3 days, 20 mg on the fourth day, 10 mg on the fifth day Call me if you're not doing well on this treatment plan

## 2015-11-05 NOTE — Progress Notes (Signed)
  [address] [place] [address] Dept: [phone]  FOLLOW UP NOTE  Patient ID: Ashley NajjarJudy V Craig, female    DOB: 02/28/1951  Age: 65 y.o. MRN: [id number] Date of Office Visit: 11/05/2015  Assessment Chief Complaint: Medication Refill and Nasal Congestion  HPI Ashley Craig presents for for evaluation of some coughing spells for about 2 months. They began with a sinus infection. She has been having nasal congestion and postnasal drainage. She has not had a fever. She is on allergy injections every 4 weeks.  Current medications Singulair 10 mg once a day, Claritin 10 mg once a day, Pro-air 2 puffs every 4 hours if needed and fluticasone 2 sprays per nostril once a day if needed. Her other medications are outlined in the chart.   Drug Allergies:  Allergies  Allergen Reactions  . Other     Shellfish-Anaphylaxis  . Codeine     nausea    Physical Exam: BP 128/82 mmHg  Pulse 72  Temp(Src) 98.1 F (36.7 C) (Oral)  Resp 16  Ht 5\' 7"  (1.702 m)  Wt 207 lb 3.7 oz (94 kg)  BMI 32.45 kg/m2   Physical Exam  Constitutional: She is oriented to person, place, and time. She appears well-developed and well-nourished.  HENT:  Eyes normal. Ears normal. Nose mild swelling of his turbinates. Pharynx normal.  Neck: Neck supple.  Cardiovascular:  S1 and S2 normal no murmurs  Pulmonary/Chest:  Clear to percussion and auscultation  Lymphadenopathy:    She has no cervical adenopathy.  Neurological: She is alert and oriented to person, place, and time.  Psychiatric: She has a normal mood and affect. Her behavior is normal. Judgment and thought content normal.  Vitals reviewed.   Diagnostics:  FVC 3.19 L FEV1 2.64 L. Predicted FVC 2.62 L predicted FEV1 2.79 L-spirometry in the normal range  Assessment and Plan: 1. Mild persistent asthma, uncomplicated   2. Gastroesophageal reflux disease without esophagitis   3. Other allergic rhinitis     Meds ordered this encounter    Medications  . montelukast (SINGULAIR) 10 MG tablet    Sig: Take 1 tablet (10 mg total) by mouth at bedtime.    Dispense:  30 tablet    Refill:  5    Patient Instructions  Continue on your current medications Add prednisone 20 mg twice a day for 3 days, 20 mg on the fourth day, 10 mg on the fifth day Call me if you're not doing well on this treatment plan    Return in about 1 year (around 11/04/2016).    Thank you for the opportunity to care for this patient.  Please do not hesitate to contact me with questions.  Tonette BihariJ. A. Bardelas, M.D.  [facility] [address] [place], [place] [address] [phone]

## 2015-11-19 ENCOUNTER — Ambulatory Visit (INDEPENDENT_AMBULATORY_CARE_PROVIDER_SITE_OTHER): Payer: BLUE CROSS/BLUE SHIELD

## 2015-11-19 DIAGNOSIS — J309 Allergic rhinitis, unspecified: Secondary | ICD-10-CM

## 2015-11-29 DIAGNOSIS — J3089 Other allergic rhinitis: Secondary | ICD-10-CM | POA: Diagnosis not present

## 2015-12-09 ENCOUNTER — Other Ambulatory Visit: Payer: Self-pay | Admitting: *Deleted

## 2015-12-09 MED ORDER — MONTELUKAST SODIUM 10 MG PO TABS: 10.0000 mg | ORAL_TABLET | Freq: Every day | ORAL | Status: DC

## 2015-12-18 ENCOUNTER — Ambulatory Visit (INDEPENDENT_AMBULATORY_CARE_PROVIDER_SITE_OTHER): Payer: BLUE CROSS/BLUE SHIELD | Admitting: *Deleted

## 2015-12-18 DIAGNOSIS — J309 Allergic rhinitis, unspecified: Secondary | ICD-10-CM | POA: Diagnosis not present

## 2015-12-30 ENCOUNTER — Telehealth: Payer: Self-pay | Admitting: Pediatrics

## 2015-12-30 NOTE — Telephone Encounter (Signed)
PLEASE ADVISE. PER [employer], olopatadine hcl ophth soln 0.1%(base equivalent) (Patanol) IS COVERED AT TIER 2. ALSO epinastine hcl ophth soln 0.05%(Elestat) IS A TIER 2.

## 2015-12-30 NOTE — Telephone Encounter (Signed)
Need eye drops called in to pharmacy if allowed. Please call patient back.

## 2015-12-31 ENCOUNTER — Other Ambulatory Visit: Payer: Self-pay | Admitting: Allergy

## 2015-12-31 MED ORDER — OLOPATADINE HCL 0.1 % OP SOLN: 1.0000 [drp] | Freq: Two times a day (BID) | OPHTHALMIC | Status: DC

## 2015-12-31 NOTE — Telephone Encounter (Signed)
Talked with patient and informed her we were faxing in  patanol eye drops per Dr Beaulah Dinning.

## 2015-12-31 NOTE — Telephone Encounter (Signed)
Does she wear contacts ? OK to send RX for Patanol Dr. Leonard Schwartz

## 2016-01-14 ENCOUNTER — Ambulatory Visit (INDEPENDENT_AMBULATORY_CARE_PROVIDER_SITE_OTHER): Payer: BLUE CROSS/BLUE SHIELD

## 2016-01-14 DIAGNOSIS — J309 Allergic rhinitis, unspecified: Secondary | ICD-10-CM | POA: Diagnosis not present

## 2016-01-16 DIAGNOSIS — I8390 Asymptomatic varicose veins of unspecified lower extremity: Secondary | ICD-10-CM | POA: Insufficient documentation

## 2016-01-16 DIAGNOSIS — I83812 Varicose veins of left lower extremities with pain: Secondary | ICD-10-CM | POA: Insufficient documentation

## 2016-01-30 DIAGNOSIS — L03115 Cellulitis of right lower limb: Secondary | ICD-10-CM | POA: Insufficient documentation

## 2016-02-11 ENCOUNTER — Ambulatory Visit (INDEPENDENT_AMBULATORY_CARE_PROVIDER_SITE_OTHER): Payer: BLUE CROSS/BLUE SHIELD

## 2016-02-11 DIAGNOSIS — J309 Allergic rhinitis, unspecified: Secondary | ICD-10-CM

## 2016-03-18 ENCOUNTER — Ambulatory Visit (INDEPENDENT_AMBULATORY_CARE_PROVIDER_SITE_OTHER): Payer: BLUE CROSS/BLUE SHIELD

## 2016-03-18 DIAGNOSIS — J309 Allergic rhinitis, unspecified: Secondary | ICD-10-CM

## 2016-04-01 ENCOUNTER — Ambulatory Visit (INDEPENDENT_AMBULATORY_CARE_PROVIDER_SITE_OTHER): Payer: BLUE CROSS/BLUE SHIELD

## 2016-04-01 DIAGNOSIS — J309 Allergic rhinitis, unspecified: Secondary | ICD-10-CM

## 2016-04-21 ENCOUNTER — Ambulatory Visit (INDEPENDENT_AMBULATORY_CARE_PROVIDER_SITE_OTHER): Payer: BLUE CROSS/BLUE SHIELD

## 2016-04-21 DIAGNOSIS — J309 Allergic rhinitis, unspecified: Secondary | ICD-10-CM

## 2016-05-06 ENCOUNTER — Ambulatory Visit (INDEPENDENT_AMBULATORY_CARE_PROVIDER_SITE_OTHER): Payer: BLUE CROSS/BLUE SHIELD

## 2016-05-06 DIAGNOSIS — J309 Allergic rhinitis, unspecified: Secondary | ICD-10-CM

## 2016-05-20 ENCOUNTER — Encounter (INDEPENDENT_AMBULATORY_CARE_PROVIDER_SITE_OTHER): Payer: BLUE CROSS/BLUE SHIELD

## 2016-05-20 DIAGNOSIS — J309 Allergic rhinitis, unspecified: Secondary | ICD-10-CM | POA: Diagnosis not present

## 2016-05-20 NOTE — Progress Notes (Deleted)
This encounter was created in error - please disregard.

## 2016-05-21 ENCOUNTER — Other Ambulatory Visit: Payer: Self-pay | Admitting: Family Medicine

## 2016-05-21 DIAGNOSIS — Z1231 Encounter for screening mammogram for malignant neoplasm of breast: Secondary | ICD-10-CM

## 2016-06-02 DIAGNOSIS — H40013 Open angle with borderline findings, low risk, bilateral: Secondary | ICD-10-CM | POA: Insufficient documentation

## 2016-06-02 DIAGNOSIS — H04123 Dry eye syndrome of bilateral lacrimal glands: Secondary | ICD-10-CM | POA: Insufficient documentation

## 2016-06-02 DIAGNOSIS — H52223 Regular astigmatism, bilateral: Secondary | ICD-10-CM | POA: Insufficient documentation

## 2016-06-02 DIAGNOSIS — H524 Presbyopia: Secondary | ICD-10-CM | POA: Insufficient documentation

## 2016-06-02 DIAGNOSIS — H11153 Pinguecula, bilateral: Secondary | ICD-10-CM | POA: Insufficient documentation

## 2016-06-02 DIAGNOSIS — H5203 Hypermetropia, bilateral: Secondary | ICD-10-CM | POA: Insufficient documentation

## 2016-06-17 ENCOUNTER — Other Ambulatory Visit: Payer: Self-pay | Admitting: Pediatrics

## 2016-06-25 ENCOUNTER — Ambulatory Visit (INDEPENDENT_AMBULATORY_CARE_PROVIDER_SITE_OTHER): Payer: BLUE CROSS/BLUE SHIELD

## 2016-06-25 DIAGNOSIS — J309 Allergic rhinitis, unspecified: Secondary | ICD-10-CM | POA: Diagnosis not present

## 2016-06-26 ENCOUNTER — Ambulatory Visit
Admission: RE | Admit: 2016-06-26 | Discharge: 2016-06-26 | Disposition: A | Discharge status: Home / Self Care | Payer: BLUE CROSS/BLUE SHIELD | Source: Ambulatory Visit | Attending: Family Medicine | Admitting: Family Medicine

## 2016-06-26 DIAGNOSIS — Z1231 Encounter for screening mammogram for malignant neoplasm of breast: Secondary | ICD-10-CM

## 2016-06-30 ENCOUNTER — Other Ambulatory Visit: Payer: Self-pay | Admitting: Family Medicine

## 2016-06-30 DIAGNOSIS — R928 Other abnormal and inconclusive findings on diagnostic imaging of breast: Secondary | ICD-10-CM

## 2016-07-07 ENCOUNTER — Ambulatory Visit
Admission: RE | Admit: 2016-07-07 | Discharge: 2016-07-07 | Disposition: A | Discharge status: Home / Self Care | Payer: BLUE CROSS/BLUE SHIELD | Source: Ambulatory Visit | Attending: Family Medicine | Admitting: Family Medicine

## 2016-07-07 DIAGNOSIS — R928 Other abnormal and inconclusive findings on diagnostic imaging of breast: Secondary | ICD-10-CM

## 2016-07-29 ENCOUNTER — Ambulatory Visit (INDEPENDENT_AMBULATORY_CARE_PROVIDER_SITE_OTHER): Payer: BLUE CROSS/BLUE SHIELD

## 2016-07-29 DIAGNOSIS — J309 Allergic rhinitis, unspecified: Secondary | ICD-10-CM

## 2016-08-12 DIAGNOSIS — L239 Allergic contact dermatitis, unspecified cause: Secondary | ICD-10-CM | POA: Insufficient documentation

## 2016-08-12 DIAGNOSIS — J411 Mucopurulent chronic bronchitis: Secondary | ICD-10-CM | POA: Insufficient documentation

## 2016-08-25 ENCOUNTER — Ambulatory Visit (INDEPENDENT_AMBULATORY_CARE_PROVIDER_SITE_OTHER): Payer: BLUE CROSS/BLUE SHIELD | Admitting: *Deleted

## 2016-08-25 DIAGNOSIS — J309 Allergic rhinitis, unspecified: Secondary | ICD-10-CM | POA: Diagnosis not present

## 2016-09-15 ENCOUNTER — Ambulatory Visit (INDEPENDENT_AMBULATORY_CARE_PROVIDER_SITE_OTHER): Payer: BLUE CROSS/BLUE SHIELD | Admitting: Pediatrics

## 2016-09-15 ENCOUNTER — Encounter: Payer: Self-pay | Admitting: Pediatrics

## 2016-09-15 VITALS — BP 138/80 | HR 78 | Temp 98.8°F | Resp 16

## 2016-09-15 DIAGNOSIS — J0100 Acute maxillary sinusitis, unspecified: Secondary | ICD-10-CM | POA: Diagnosis not present

## 2016-09-15 DIAGNOSIS — K219 Gastro-esophageal reflux disease without esophagitis: Secondary | ICD-10-CM | POA: Diagnosis not present

## 2016-09-15 DIAGNOSIS — J4530 Mild persistent asthma, uncomplicated: Secondary | ICD-10-CM | POA: Diagnosis not present

## 2016-09-15 DIAGNOSIS — J3089 Other allergic rhinitis: Secondary | ICD-10-CM | POA: Diagnosis not present

## 2016-09-15 MED ORDER — MONTELUKAST SODIUM 10 MG PO TABS: 10.0000 mg | ORAL_TABLET | Freq: Every day | ORAL | 3 refills | Status: DC

## 2016-09-15 MED ORDER — ALBUTEROL SULFATE HFA 108 (90 BASE) MCG/ACT IN AERS: 1.0000 | INHALATION_SPRAY | Freq: Four times a day (QID) | RESPIRATORY_TRACT | 1 refills | Status: DC | PRN

## 2016-09-15 MED ORDER — AMOXICILLIN-POT CLAVULANATE 875-125 MG PO TABS: ORAL_TABLET | ORAL | 0 refills | Status: DC

## 2016-09-15 NOTE — Patient Instructions (Signed)
Claritin 10 mg once a day for runny nose Fluticasone 2 sprays per nostril once a day if needed for stuffy nose Montelukast 10 mg once a day for coughing or wheezing Pro-air 2 puffs every 4 hours if needed for wheezing or coughing spells Augmentin 875 mg every 12 hours for 10 days for the sinus infection Omeprazole 20 mg twice a day to see if it helps with clearing of the throat Add prednisone 10 mg twice a day for 4 days, 10 mg on the fifth day Continue on allergy injections every 4 weeks

## 2016-09-15 NOTE — Progress Notes (Signed)
[address] [place] [address] Dept: [phone]  FOLLOW UP NOTE  Patient ID: Ashley Craig Lalanne, female    DOB: 08/08/1951  Age: 65 y.o. MRN: [id number] Date of Office Visit: 09/15/2016  Assessment  Chief Complaint: Allergic Rhinitis  (ears hurt, chest congestion in the morning)  HPI Ashley Craig Hsiao presents for treatment of coughing and chest congestion. She has had a sore throat and a discolored postnasal drainage. Over the past 2 months she has had a great deal of clearing of her throat. Her asthma has been well controlled. She is on allergy injections every 4 weeks.  Current medications are Claritin 10 mg once a day, montelukast  10 mg once a day, fluticasone 2 sprays per nostril once a day if needed and Claritin 10 mg once a day. Her other medications are outlined in the chart   Drug Allergies:  Allergies  Allergen Reactions  . Other     Shellfish-Anaphylaxis  . Codeine     nausea    Physical Exam: BP 138/80   Pulse 78   Temp 98.8 F (37.1 C) (Oral)   Resp 16   SpO2 96%    Physical Exam  Constitutional: She is oriented to person, place, and time. She appears well-developed and well-nourished.  HENT:  Eyes normal. Ears normal. Nose moderate swelling of nasal  turbinates Pharynx normal except for yellow-green postnasal drainage  Neck: Neck supple.  Cardiovascular:  S1 and S2 normal no murmurs  Pulmonary/Chest:  Clear to percussion auscultation  Lymphadenopathy:    She has no cervical adenopathy.  Neurological: She is alert and oriented to person, place, and time.  Psychiatric: She has a normal mood and affect. Her behavior is normal. Judgment and thought content normal.  Vitals reviewed.   Diagnostics:  FVC 3.79 L FEV1 3.04 L. Predicted FVC 3.59 L predicted FEV1 2.76 L-the spirometry is in the normal range  Assessment and Plan: 1. Mild persistent asthma without complication   2. Other allergic rhinitis   3. Acute non-recurrent maxillary sinusitis     4. Gastroesophageal reflux disease without esophagitis     Meds ordered this encounter  Medications  . montelukast (SINGULAIR) 10 MG tablet    Sig: Take 1 tablet (10 mg total) by mouth at bedtime.    Dispense:  90 tablet    Refill:  3  . albuterol (PROAIR HFA) 108 (90 Base) MCG/ACT inhaler    Sig: Inhale 1 puff into the lungs every 6 (six) hours as needed for wheezing or shortness of breath.    Dispense:  1 Inhaler    Refill:  1  . amoxicillin-clavulanate (AUGMENTIN) 875-125 MG tablet    Sig: One tablet every 12 hours for 10 days  for sinus infecteion    Dispense:  20 tablet    Refill:  0    Patient Instructions  Claritin 10 mg once a day for runny nose Fluticasone 2 sprays per nostril once a day if needed for stuffy nose Montelukast 10 mg once a day for coughing or wheezing Pro-air 2 puffs every 4 hours if needed for wheezing or coughing spells Augmentin 875 mg every 12 hours for 10 days for the sinus infection Omeprazole 20 mg twice a day to see if it helps with clearing of the throat Add prednisone 10 mg twice a day for 4 days, 10 mg on the fifth day Continue on allergy injections every 4 weeks   Return in about 4 weeks (around 10/13/2016).  Thank you for the opportunity to care for this patient.  Please do not hesitate to contact me with questions.  Penne Lash, M.D.  [facility] [address] [place], [place] [address] [phone]

## 2016-09-21 ENCOUNTER — Ambulatory Visit (INDEPENDENT_AMBULATORY_CARE_PROVIDER_SITE_OTHER): Payer: BLUE CROSS/BLUE SHIELD

## 2016-09-21 DIAGNOSIS — J309 Allergic rhinitis, unspecified: Secondary | ICD-10-CM

## 2016-10-08 DIAGNOSIS — J3089 Other allergic rhinitis: Secondary | ICD-10-CM

## 2016-10-19 ENCOUNTER — Ambulatory Visit (INDEPENDENT_AMBULATORY_CARE_PROVIDER_SITE_OTHER): Payer: BLUE CROSS/BLUE SHIELD

## 2016-10-19 DIAGNOSIS — J309 Allergic rhinitis, unspecified: Secondary | ICD-10-CM

## 2016-11-10 ENCOUNTER — Ambulatory Visit (INDEPENDENT_AMBULATORY_CARE_PROVIDER_SITE_OTHER): Payer: BLUE CROSS/BLUE SHIELD

## 2016-11-10 DIAGNOSIS — J309 Allergic rhinitis, unspecified: Secondary | ICD-10-CM | POA: Diagnosis not present

## 2016-11-13 ENCOUNTER — Ambulatory Visit (INDEPENDENT_AMBULATORY_CARE_PROVIDER_SITE_OTHER): Payer: BLUE CROSS/BLUE SHIELD | Admitting: Orthopedic Surgery

## 2016-11-13 ENCOUNTER — Ambulatory Visit (INDEPENDENT_AMBULATORY_CARE_PROVIDER_SITE_OTHER): Payer: Self-pay

## 2016-11-13 ENCOUNTER — Encounter (INDEPENDENT_AMBULATORY_CARE_PROVIDER_SITE_OTHER): Payer: Self-pay

## 2016-11-13 VITALS — Ht 67.0 in | Wt 207.0 lb

## 2016-11-13 DIAGNOSIS — G8929 Other chronic pain: Secondary | ICD-10-CM | POA: Diagnosis not present

## 2016-11-13 DIAGNOSIS — M25562 Pain in left knee: Secondary | ICD-10-CM

## 2016-11-13 DIAGNOSIS — M1712 Unilateral primary osteoarthritis, left knee: Secondary | ICD-10-CM

## 2016-11-13 MED ORDER — LIDOCAINE HCL 1 % IJ SOLN
5.0000 mL | INTRAMUSCULAR | Status: AC | PRN
  Administered 2016-11-13: 5 mL

## 2016-11-13 MED ORDER — METHYLPREDNISOLONE ACETATE 40 MG/ML IJ SUSP
40.0000 mg | INTRAMUSCULAR | Status: AC | PRN
  Administered 2016-11-13: 40 mg via INTRA_ARTICULAR

## 2016-11-13 NOTE — Progress Notes (Signed)
Office Visit Note   Patient: Ashley Craig           Date of Birth: [birth year]           MRN: [id number] Visit Date: 11/13/2016              Requested by: Cheral Bay, MD [address] [facility], [place] [address] PCP: Cheral Bay, MD  Chief Complaint  Patient presents with  . Left Knee - Pain    HPI: Patient states that she has on and off left knee pain. She states tht she has decreased ROM. Reports difficulty getting her leg inside the car and pain with steps. Does feel like the knee will give way at times. She does report start up stiffness with prolonged rest. She states that she is having achy feeling. Pt states that she has a history of a a baker's cyst and does have posterior calf pain. Most of the knee pain is anterior and does note some swelling. She states that she has had some recent bruising but has not had injury and she is not on a blood thinner and is concerned about this. Patient states that the pain has been on going on and off for years and that now it is getting worse and the pain is not going away. She takes tylenol prn pain and tries to ice at times. She has also tried Biofreeze and this did help in the beginning but not now Rodena Medin, RMA    Assessment & Plan: Visit Diagnoses:  1. Unilateral primary osteoarthritis, left knee   2. Chronic pain of left knee     Plan: Left knee injected without complications. Patient will continue conservative therapy discussed options of total knee replacement. Discussed the possibility of repeat injections.  Follow-Up Instructions: Return in about 4 weeks (around 12/11/2016).   Ortho Exam On examination patient is alert oriented no adenopathy well-dressed normal affect number Rob Bunting effort she does have an antalgic gait. Examination she is tender to palpation medial lateral joint line as well as patellofemoral joint worse at the superior pole the patella there is no effusion no redness no  cellulitis no signs of gout or infection. Collaterals and cruciates are stable. Radiographs show tricompartmental arthritic changes.  Imaging: Xr Knee 1-2 Views Left  Result Date: 11/13/2016 2 view radiographs left knee shows tricompartmental arthritis with periarticular bony spurs in all 3 compartments joint space narrowing with calcification of the meniscus.   Orders:  Orders Placed This Encounter  Procedures  . XR Knee 1-2 Views Left   No orders of the defined types were placed in this encounter.    Procedures: Large Joint Inj Date/Time: 11/13/2016 10:48 AM Performed by: Travanti Mcmanus V Authorized by: Nadara Mustard   Consent Given by:  Patient Site marked: the procedure site was marked   Timeout: prior to procedure the correct patient, procedure, and site was verified   Indications:  Pain and diagnostic evaluation Location:  Knee Site:  L knee Prep: patient was prepped and draped in usual sterile fashion   Needle Size:  22 G Needle Length:  1.5 inches Ultrasound Guidance: No   Fluoroscopic Guidance: No   Arthrogram: No   Medications:  5 mL lidocaine 1 %; 40 mg methylPREDNISolone acetate 40 MG/ML Aspiration Attempted: No   Patient tolerance:  Patient tolerated the procedure well with no immediate complications    Clinical Data: No additional findings.  Subjective: Review of Systems  Objective: Vital Signs: Ht 5\' 7"  (1.702 m)   Wt 207 lb (93.9 kg)   BMI 32.42 kg/m   Specialty Comments:  No specialty comments available.  PMFS History: Patient Active Problem List   Diagnosis Date Noted  . Unilateral primary osteoarthritis, left knee 11/13/2016  . Acute non-recurrent maxillary sinusitis 09/15/2016  . Mild persistent asthma 11/05/2015  . Gastroesophageal reflux disease without esophagitis 11/05/2015  . Other allergic rhinitis 11/05/2015  . Asthma 07/11/2015  . Lumbar stenosis with neurogenic claudication 10/11/2014   Past Medical History:  Diagnosis Date   . Anxiety    takes Zoloft daily;takes Clonazepam daily as needed  . Arthritis   . Chronic back pain    stenosis  . Diarrhea    occasionally  . GERD (gastroesophageal reflux disease)    takes Omeprazole daily   . History of blood clots 10 yrs ago   left calf after a bone break  . History of bronchitis 2014  . History of colon polyps    benign  . Hyperlipidemia    takes Simvastatin daily  . Insomnia    takes Melatonin nightly  . Joint pain   . Seasonal allergies    takes Claritin daily;Nasonex and ProAir as needed;Singulair daily  . Weakness    numbness and tingling in right leg    No family history on file.  Past Surgical History:  Procedure Laterality Date  . ANAL FISSURE REPAIR    . colonosocpy    . D&C of bladder  5846yrs ago  . ESOPHAGOGASTRODUODENOSCOPY     with ED  . TONSILLECTOMY    . WRIST SURGERY Right    with plate   Social History   Occupational History  . Not on file.   Social History Main Topics  . Smoking status: Never Smoker  . Smokeless tobacco: Never Used  . Alcohol use Yes     Comment: wine daily  . Drug use: No  . Sexual activity: Not on file

## 2016-12-07 ENCOUNTER — Ambulatory Visit (INDEPENDENT_AMBULATORY_CARE_PROVIDER_SITE_OTHER): Payer: BLUE CROSS/BLUE SHIELD

## 2016-12-07 ENCOUNTER — Other Ambulatory Visit: Payer: Self-pay

## 2016-12-07 DIAGNOSIS — J309 Allergic rhinitis, unspecified: Secondary | ICD-10-CM | POA: Diagnosis not present

## 2016-12-11 NOTE — Addendum Note (Signed)
Addended by: Virl SonGAINEY, DAMITA D on: 12/11/2016 04:10 PM   Modules accepted: Orders

## 2016-12-17 ENCOUNTER — Other Ambulatory Visit: Payer: Self-pay | Admitting: Pediatrics

## 2016-12-23 ENCOUNTER — Ambulatory Visit (INDEPENDENT_AMBULATORY_CARE_PROVIDER_SITE_OTHER): Payer: BLUE CROSS/BLUE SHIELD

## 2016-12-23 DIAGNOSIS — J309 Allergic rhinitis, unspecified: Secondary | ICD-10-CM

## 2016-12-24 ENCOUNTER — Other Ambulatory Visit: Payer: Self-pay

## 2016-12-24 ENCOUNTER — Telehealth: Payer: Self-pay | Admitting: Pediatrics

## 2016-12-24 MED ORDER — EPINEPHRINE 0.3 MG/0.3ML IJ SOAJ: 0.3000 mg | Freq: Once | INTRAMUSCULAR | 1 refills | Status: AC

## 2016-12-24 NOTE — Telephone Encounter (Signed)
Please call pt back regarding a prescription issue

## 2016-12-24 NOTE — Telephone Encounter (Signed)
Spoke with pt and sent in epipen refill

## 2016-12-24 NOTE — Telephone Encounter (Signed)
Lm for pt to call us back  

## 2017-01-05 ENCOUNTER — Ambulatory Visit (INDEPENDENT_AMBULATORY_CARE_PROVIDER_SITE_OTHER): Payer: BLUE CROSS/BLUE SHIELD | Admitting: Pediatrics

## 2017-01-05 ENCOUNTER — Encounter: Payer: Self-pay | Admitting: Pediatrics

## 2017-01-05 ENCOUNTER — Ambulatory Visit: Payer: Self-pay

## 2017-01-05 VITALS — BP 132/86 | HR 64 | Temp 98.2°F | Resp 12

## 2017-01-05 DIAGNOSIS — R21 Rash and other nonspecific skin eruption: Secondary | ICD-10-CM

## 2017-01-05 DIAGNOSIS — K219 Gastro-esophageal reflux disease without esophagitis: Secondary | ICD-10-CM

## 2017-01-05 DIAGNOSIS — J4530 Mild persistent asthma, uncomplicated: Secondary | ICD-10-CM

## 2017-01-05 DIAGNOSIS — J309 Allergic rhinitis, unspecified: Secondary | ICD-10-CM

## 2017-01-05 DIAGNOSIS — J3089 Other allergic rhinitis: Secondary | ICD-10-CM

## 2017-01-05 NOTE — Progress Notes (Signed)
  [address] [place] [address] Dept: [phone]  FOLLOW UP NOTE  Patient ID: Ashley NajjarJudy V Retzloff, female    DOB: 07/11/1951  Age: 66 y.o. MRN: [id number] Date of Office Visit: 01/05/2017  Assessment  Chief Complaint: Rash (present for the last 2 months. feels like a sunburn. pt. states she hasn't changed laundry detergents); Allergic Rhinitis  (doing well); and Asthma (doing well)  HPI Ashley NajjarJudy V Eckerman presents for evaluation of a rash which has been present for about 2 months. The rash feels like a sunburn. If she wears a necklaces,  it  irritates her rash. She has been increasing her dose of Lamictal. She is on allergy injections every 4 weeks and does well. Her asthma is well controlled. Her nasal symptoms are well controlled.  Current medications are Lamictal 200 mg once a day , montelukast  10 mg once a day, fluticasone 2 sprays per nostril once a day, Claritin 10 mg once a day. Her other medications are outlined in the chart   Drug Allergies:  Allergies  Allergen Reactions  . Other     Shellfish-Anaphylaxis  . Codeine     nausea    Physical Exam: BP 132/86 (BP Location: Left Arm, Patient Position: Sitting, Cuff Size: Normal)   Pulse 64   Temp 98.2 F (36.8 C) (Oral)   Resp 12    Physical Exam  Constitutional: She is oriented to person, place, and time. She appears well-developed and well-nourished.  HENT:  Eyes normal. Ears normal. Nose normal. Pharynx normal.  Neck: Neck supple. No thyromegaly present.  Cardiovascular:  S1 and S2 normal no murmurs  Pulmonary/Chest:  Clear to percussion auscultation  Lymphadenopathy:    She has no cervical adenopathy.  Neurological: She is alert and oriented to person, place, and time.  Skin:  She had an erythematous macular papular rash involving her neck and upper thorax  Psychiatric: She has a normal mood and affect. Her behavior is normal. Thought content normal.  Vitals reviewed.   Diagnostics:  FVC 3.02 L FEV1  2.67 L. Predicted FVC 3.59 L predicted FEV1 2.76 L-the spirometry is in the normal range  Assessment and Plan: 1. Mild persistent asthma without complication   2. Gastroesophageal reflux disease without esophagitis   3. Other allergic rhinitis   4. Rash       Patient Instructions  Continue on her current medications She will contact her therapist regarding tapering and discontinuing Lamictal if possible If Lamictal is not the culprit for this photosensitive rash, patch testing would be useful Call me if the rash does not clear up   Return in about 6 months (around 07/08/2017).    Thank you for the opportunity to care for this patient.  Please do not hesitate to contact me with questions.  Tonette BihariJ. A. Avi Archuleta, M.D.  [facility] [address] [place], [place] [address] [phone]

## 2017-01-05 NOTE — Patient Instructions (Addendum)
Continue on her current medications She will contact her therapist regarding tapering and discontinuing Lamictal if possible If Lamictal is not the culprit for this photosensitive rash, patch testing would be useful Call me if the rash does not clear up

## 2017-01-25 ENCOUNTER — Ambulatory Visit (INDEPENDENT_AMBULATORY_CARE_PROVIDER_SITE_OTHER): Payer: BLUE CROSS/BLUE SHIELD

## 2017-01-25 DIAGNOSIS — J309 Allergic rhinitis, unspecified: Secondary | ICD-10-CM | POA: Diagnosis not present

## 2017-02-10 ENCOUNTER — Ambulatory Visit (INDEPENDENT_AMBULATORY_CARE_PROVIDER_SITE_OTHER): Payer: BLUE CROSS/BLUE SHIELD

## 2017-02-10 DIAGNOSIS — J309 Allergic rhinitis, unspecified: Secondary | ICD-10-CM | POA: Diagnosis not present

## 2017-03-09 ENCOUNTER — Ambulatory Visit (INDEPENDENT_AMBULATORY_CARE_PROVIDER_SITE_OTHER): Payer: BLUE CROSS/BLUE SHIELD

## 2017-03-09 DIAGNOSIS — J309 Allergic rhinitis, unspecified: Secondary | ICD-10-CM | POA: Diagnosis not present

## 2017-03-18 ENCOUNTER — Other Ambulatory Visit: Payer: Self-pay | Admitting: Pediatrics

## 2017-03-26 ENCOUNTER — Encounter: Payer: Self-pay | Admitting: *Deleted

## 2017-03-26 NOTE — Progress Notes (Signed)
Maintenance vial made. Exp: 04/02/18. hc 

## 2017-03-30 DIAGNOSIS — M25572 Pain in left ankle and joints of left foot: Secondary | ICD-10-CM

## 2017-03-30 DIAGNOSIS — G8929 Other chronic pain: Secondary | ICD-10-CM | POA: Insufficient documentation

## 2017-04-08 DIAGNOSIS — I951 Orthostatic hypotension: Secondary | ICD-10-CM | POA: Insufficient documentation

## 2017-04-09 ENCOUNTER — Ambulatory Visit (INDEPENDENT_AMBULATORY_CARE_PROVIDER_SITE_OTHER): Payer: Self-pay

## 2017-04-09 ENCOUNTER — Encounter (INDEPENDENT_AMBULATORY_CARE_PROVIDER_SITE_OTHER): Payer: Self-pay | Admitting: Orthopedic Surgery

## 2017-04-09 ENCOUNTER — Ambulatory Visit (INDEPENDENT_AMBULATORY_CARE_PROVIDER_SITE_OTHER): Payer: BLUE CROSS/BLUE SHIELD | Admitting: Orthopedic Surgery

## 2017-04-09 VITALS — Ht 67.0 in | Wt 207.0 lb

## 2017-04-09 DIAGNOSIS — M25572 Pain in left ankle and joints of left foot: Secondary | ICD-10-CM

## 2017-04-09 MED ORDER — LIDOCAINE HCL 1 % IJ SOLN
2.0000 mL | INTRAMUSCULAR | Status: AC | PRN
  Administered 2017-04-09: 2 mL

## 2017-04-09 MED ORDER — METHYLPREDNISOLONE ACETATE 40 MG/ML IJ SUSP
40.0000 mg | INTRAMUSCULAR | Status: AC | PRN
  Administered 2017-04-09: 40 mg via INTRA_ARTICULAR

## 2017-04-09 NOTE — Progress Notes (Signed)
Office Visit Note   Patient: Ashley Craig           Date of Birth: 10-30-1951           MRN: [id number] Visit Date: 04/09/2017              Requested by: Cheral Bay, MD [address] [facility], [place] [address] PCP: Cheral Bay, MD  Chief Complaint  Patient presents with  . Left Ankle - Pain      HPI: Patient presents complaining of bilateral anterior ankle pain for the past several months she denies any specific injuries. She states she has had some vein ablation at the vein center and patient states that she has been having swelling primarily over the lateral joint line. She states she has decreased range of motion especially going up and down steps.  Assessment & Plan: Visit Diagnoses:  1. Pain in left ankle and joints of left foot     Plan: The ankle was injected she tolerated this well follow-up as needed if she is still symptomatic may need to consider an MRI scan or arthroscopic intervention.  Follow-Up Instructions: Return if symptoms worsen or fail to improve.   Ortho Exam  Patient is alert, oriented, no adenopathy, well-dressed, normal affect, normal respiratory effort. Patient has an antalgic gait she has good pulses she has good ankle good subtalar motion. Her peroneal tendons are little bit swollen but nontender to palpation. The posterior tibial tendon is nontender to palpation. She has good active and passive range of motion. She is tender to palpation over the anterior lateral joint line and the medial joint line of the ankle is nontender to palpation. There is no redness no cellulitis patient denies any history of gout.  Imaging: Xr Ankle 2 Views Left  Result Date: 04/09/2017 Two-view radiographs of the left ankle shows some small bony spurs of the navicular at the talonavicular joint. The ankle joint is congruent with no cystic changes no joint space narrowing.   Labs: No results found for: HGBA1C, ESRSEDRATE, CRP,  LABURIC, REPTSTATUS, GRAMSTAIN, CULT, LABORGA  Orders:  Orders Placed This Encounter  Procedures  . XR Ankle 2 Views Left   No orders of the defined types were placed in this encounter.    Procedures: Medium Joint Inj Date/Time: 04/09/2017 9:41 AM Performed by: DUDA, MARCUS V Authorized by: Nadara Mustard   Consent Given by:  Patient Site marked: the procedure site was marked   Timeout: prior to procedure the correct patient, procedure, and site was verified   Indications:  Pain and diagnostic evaluation Location:  Ankle Site:  L ankle Prep: patient was prepped and draped in usual sterile fashion   Needle Size:  22 G Needle Length:  1.5 inches Approach:  Anteromedial Ultrasound Guided: No   Fluoroscopic Guidance: No   Medications:  2 mL lidocaine 1 %; 40 mg methylPREDNISolone acetate 40 MG/ML Aspiration Attempted: No   Patient tolerance:  Patient tolerated the procedure well with no immediate complications    Clinical Data: No additional findings.  ROS:  All other systems negative, except as noted in the HPI. Review of Systems  Objective: Vital Signs: Ht 5\' 7"  (1.702 m)   Wt 207 lb (93.9 kg)   BMI 32.42 kg/m   Specialty Comments:  No specialty comments available.  PMFS History: Patient Active Problem List   Diagnosis Date Noted  . Rash 01/05/2017  . Unilateral primary osteoarthritis, left knee 11/13/2016  .  Acute non-recurrent maxillary sinusitis 09/15/2016  . Mild persistent asthma 11/05/2015  . Gastroesophageal reflux disease without esophagitis 11/05/2015  . Other allergic rhinitis 11/05/2015  . Asthma 07/11/2015  . Lumbar stenosis with neurogenic claudication 10/11/2014   Past Medical History:  Diagnosis Date  . Anxiety    takes Zoloft daily;takes Clonazepam daily as needed  . Arthritis   . Chronic back pain    stenosis  . Diarrhea    occasionally  . GERD (gastroesophageal reflux disease)    takes Omeprazole daily   . History of blood clots  10 yrs ago   left calf after a bone break  . History of bronchitis 2014  . History of colon polyps    benign  . Hyperlipidemia    takes Simvastatin daily  . Insomnia    takes Melatonin nightly  . Joint pain   . Seasonal allergies    takes Claritin daily;Nasonex and ProAir as needed;Singulair daily  . Weakness    numbness and tingling in right leg    No family history on file.  Past Surgical History:  Procedure Laterality Date  . ANAL FISSURE REPAIR    . colonosocpy    . D&C of bladder  5241yrs ago  . ESOPHAGOGASTRODUODENOSCOPY     with ED  . TONSILLECTOMY    . WRIST SURGERY Right    with plate   Social History   Occupational History  . Not on file.   Social History Main Topics  . Smoking status: Never Smoker  . Smokeless tobacco: Never Used  . Alcohol use Yes     Comment: wine daily  . Drug use: No  . Sexual activity: Not on file

## 2017-04-14 ENCOUNTER — Ambulatory Visit (INDEPENDENT_AMBULATORY_CARE_PROVIDER_SITE_OTHER): Payer: BLUE CROSS/BLUE SHIELD

## 2017-04-14 DIAGNOSIS — J309 Allergic rhinitis, unspecified: Secondary | ICD-10-CM | POA: Diagnosis not present

## 2017-04-16 DIAGNOSIS — J3089 Other allergic rhinitis: Secondary | ICD-10-CM | POA: Diagnosis not present

## 2017-05-12 ENCOUNTER — Ambulatory Visit (INDEPENDENT_AMBULATORY_CARE_PROVIDER_SITE_OTHER): Payer: BLUE CROSS/BLUE SHIELD

## 2017-05-12 DIAGNOSIS — J309 Allergic rhinitis, unspecified: Secondary | ICD-10-CM | POA: Diagnosis not present

## 2017-05-21 DIAGNOSIS — K2100 Gastro-esophageal reflux disease with esophagitis, without bleeding: Secondary | ICD-10-CM | POA: Insufficient documentation

## 2017-05-21 DIAGNOSIS — Z860100 Personal history of colon polyps, unspecified: Secondary | ICD-10-CM | POA: Insufficient documentation

## 2017-05-21 DIAGNOSIS — R197 Diarrhea, unspecified: Secondary | ICD-10-CM | POA: Insufficient documentation

## 2017-05-27 ENCOUNTER — Other Ambulatory Visit: Payer: Self-pay | Admitting: Family Medicine

## 2017-05-27 DIAGNOSIS — Z1231 Encounter for screening mammogram for malignant neoplasm of breast: Secondary | ICD-10-CM

## 2017-06-02 ENCOUNTER — Other Ambulatory Visit: Payer: Self-pay | Admitting: Allergy

## 2017-06-02 MED ORDER — FLUTICASONE PROPIONATE 50 MCG/ACT NA SUSP: NASAL | 5 refills | Status: DC

## 2017-06-14 ENCOUNTER — Other Ambulatory Visit: Payer: Self-pay | Admitting: Pediatrics

## 2017-06-14 ENCOUNTER — Ambulatory Visit (INDEPENDENT_AMBULATORY_CARE_PROVIDER_SITE_OTHER): Payer: BLUE CROSS/BLUE SHIELD | Admitting: *Deleted

## 2017-06-14 DIAGNOSIS — J309 Allergic rhinitis, unspecified: Secondary | ICD-10-CM

## 2017-06-28 ENCOUNTER — Ambulatory Visit
Admission: RE | Admit: 2017-06-28 | Discharge: 2017-06-28 | Disposition: A | Discharge status: Home / Self Care | Payer: BLUE CROSS/BLUE SHIELD | Source: Ambulatory Visit | Attending: Family Medicine | Admitting: Family Medicine

## 2017-06-28 DIAGNOSIS — Z1231 Encounter for screening mammogram for malignant neoplasm of breast: Secondary | ICD-10-CM

## 2017-07-11 ENCOUNTER — Other Ambulatory Visit: Payer: Self-pay | Admitting: Pediatrics

## 2017-07-12 ENCOUNTER — Encounter (INDEPENDENT_AMBULATORY_CARE_PROVIDER_SITE_OTHER): Payer: Self-pay | Admitting: Orthopedic Surgery

## 2017-07-12 ENCOUNTER — Ambulatory Visit (INDEPENDENT_AMBULATORY_CARE_PROVIDER_SITE_OTHER): Payer: BLUE CROSS/BLUE SHIELD | Admitting: Orthopedic Surgery

## 2017-07-12 ENCOUNTER — Ambulatory Visit (INDEPENDENT_AMBULATORY_CARE_PROVIDER_SITE_OTHER): Payer: BLUE CROSS/BLUE SHIELD

## 2017-07-12 DIAGNOSIS — M25872 Other specified joint disorders, left ankle and foot: Secondary | ICD-10-CM | POA: Insufficient documentation

## 2017-07-12 DIAGNOSIS — J309 Allergic rhinitis, unspecified: Secondary | ICD-10-CM

## 2017-07-12 NOTE — Progress Notes (Signed)
Office Visit Note   Patient: Ashley Craig           Date of Birth: [birth year]           MRN: [id number] Visit Date: 07/12/2017              Requested by: Cheral Bay, MD [address] [facility], [place] [address] PCP: Cheral Bay, MD  Chief Complaint  Patient presents with  . Left Ankle - Follow-up      HPI: Patient is a 66 year old woman who is status post intra-articular steroid injection. She states she did get good temporary relief she states she has swelling by the end of the day with unable to perform activities of daily living due to swelling. Patient [place] and is extremely painful to walk and stand or walk up and down stairs. She states she has tried compression for swelling but this has not helped.  Assessment & Plan: Visit Diagnoses:  1. Impingement syndrome of left ankle     Plan: Due to failure of conservative care with temporary relief with an intra-articular injection patient elected to proceed with arthroscopic intervention for debridement of the impingement. Risks and benefits were discussed including persistent pain infection need for additional surgery. Patient states she understands wish to proceed at this time.  Follow-Up Instructions: Return in about 2 weeks (around 07/26/2017).   Ortho Exam  Patient is alert, oriented, no adenopathy, well-dressed, normal affect, normal respiratory effort. Examination patient has good pulses she does have some varicose veins. She is point tender to palpation over the medial and lateral gutters of the left ankle tender to palpation anteriorly over the joint line she has dorsiflexion about 10 past neutral with her knee extended. She has good subtalar motion. The peroneal posterior tibial tendon and Achilles tendon are nontender to palpation. She has no tenderness to palpation over the lateral ankle ligaments. Anterior drawer is stable.  Imaging: No results  found. No images are attached to the encounter.  Labs: No results found for: HGBA1C, ESRSEDRATE, CRP, LABURIC, REPTSTATUS, GRAMSTAIN, CULT, LABORGA  Orders:  No orders of the defined types were placed in this encounter.  No orders of the defined types were placed in this encounter.    Procedures: No procedures performed  Clinical Data: No additional findings.  ROS:  All other systems negative, except as noted in the HPI. Review of Systems  Objective: Vital Signs: There were no vitals taken for this visit.  Specialty Comments:  No specialty comments available.  PMFS History: Patient Active Problem List   Diagnosis Date Noted  . Impingement syndrome of left ankle 07/12/2017  . Rash 01/05/2017  . Unilateral primary osteoarthritis, left knee 11/13/2016  . Acute non-recurrent maxillary sinusitis 09/15/2016  . Mild persistent asthma 11/05/2015  . Gastroesophageal reflux disease without esophagitis 11/05/2015  . Other allergic rhinitis 11/05/2015  . Asthma 07/11/2015  . Lumbar stenosis with neurogenic claudication 10/11/2014   Past Medical History:  Diagnosis Date  . Anxiety    takes Zoloft daily;takes Clonazepam daily as needed  . Arthritis   . Chronic back pain    stenosis  . Diarrhea    occasionally  . GERD (gastroesophageal reflux disease)    takes Omeprazole daily   . History of blood clots 10 yrs ago   left calf after a bone break  . History of bronchitis 2014  . History of colon polyps    benign  .  Hyperlipidemia    takes Simvastatin daily  . Insomnia    takes Melatonin nightly  . Joint pain   . Seasonal allergies    takes Claritin daily;Nasonex and ProAir as needed;Singulair daily  . Weakness    numbness and tingling in right leg    No family history on file.  Past Surgical History:  Procedure Laterality Date  . ANAL FISSURE REPAIR    . colonosocpy    . D&C of bladder  4377yrs ago  . ESOPHAGOGASTRODUODENOSCOPY     with ED  . TONSILLECTOMY     . WRIST SURGERY Right    with plate   Social History   Occupational History  . Not on file.   Social History Main Topics  . Smoking status: Never Smoker  . Smokeless tobacco: Never Used  . Alcohol use Yes     Comment: wine daily  . Drug use: No  . Sexual activity: Not on file

## 2017-07-13 DIAGNOSIS — J208 Acute bronchitis due to other specified organisms: Secondary | ICD-10-CM | POA: Insufficient documentation

## 2017-07-27 ENCOUNTER — Telehealth (INDEPENDENT_AMBULATORY_CARE_PROVIDER_SITE_OTHER): Payer: Self-pay | Admitting: Orthopedic Surgery

## 2017-07-27 NOTE — Telephone Encounter (Signed)
Pt wants to know if she can use knee scooter instead of crunches

## 2017-07-28 NOTE — Telephone Encounter (Signed)
Advised patient she can use whatever is easier for her. Did advise her insurance typically won't cover these, and she would have to pay for renting. She expressed understanding.

## 2017-08-02 ENCOUNTER — Telehealth (INDEPENDENT_AMBULATORY_CARE_PROVIDER_SITE_OTHER): Payer: Self-pay

## 2017-08-02 NOTE — Telephone Encounter (Signed)
I spoke with Ms. Ashley Craig. She states that she is feeling much better this afternoon.  She has no fever, just a cough when she moves around a lot.  I advised her to [facility] and have anesthesia evaluate her.  She is aware that they may cancel surgery if they feel she is not a good candidate at this time.

## 2017-08-02 NOTE — Telephone Encounter (Signed)
Patient called stated she is scheduled to have surgery with Dr Lajoyce Corners on tomorrow 08/03/17 for an outpatient procedure.  She said that she has been fighting a sinus infection for over a month. Has been to her PCP and has had steroids, antibiotics and taking OTC meds without much improvement. She would like to know if her surgery is going to be cancelled because of this. She reports no fever but is having a bad cough and sinus drainage. Please call her to advise. [phone]

## 2017-08-03 DIAGNOSIS — M19172 Post-traumatic osteoarthritis, left ankle and foot: Secondary | ICD-10-CM | POA: Diagnosis not present

## 2017-08-08 ENCOUNTER — Other Ambulatory Visit: Payer: Self-pay | Admitting: Pediatrics

## 2017-08-10 ENCOUNTER — Ambulatory Visit (INDEPENDENT_AMBULATORY_CARE_PROVIDER_SITE_OTHER): Payer: BLUE CROSS/BLUE SHIELD

## 2017-08-10 DIAGNOSIS — J309 Allergic rhinitis, unspecified: Secondary | ICD-10-CM

## 2017-08-17 ENCOUNTER — Encounter (INDEPENDENT_AMBULATORY_CARE_PROVIDER_SITE_OTHER): Payer: Self-pay | Admitting: Orthopedic Surgery

## 2017-08-17 ENCOUNTER — Ambulatory Visit (INDEPENDENT_AMBULATORY_CARE_PROVIDER_SITE_OTHER): Payer: BLUE CROSS/BLUE SHIELD | Admitting: Orthopedic Surgery

## 2017-08-17 DIAGNOSIS — M25872 Other specified joint disorders, left ankle and foot: Secondary | ICD-10-CM

## 2017-08-17 NOTE — Progress Notes (Signed)
Office Visit Note   Patient: Ashley Craig           Date of Birth: 19-May-1951           MRN: [id number] Visit Date: 08/17/2017              Requested by: Cheral Bay, MD [address] [facility], [place] [address] PCP: Cheral Bay, MD  Chief Complaint  Patient presents with  . Left Ankle - Routine Post Op    2 weeks post op left ankle arthroscopy and debridement      HPI: Patient presents status post left ankle arthroscopy. She states she has been asymptomatic has had some swelling and has some aches and pains when she overdoes it.  Assessment & Plan: Visit Diagnoses:  1. Impingement syndrome of left ankle     Plan: patient was given instructions for Achilles stretching she will discontinue the postoperative shoe no restrictions follow-up as needed.  Follow-Up Instructions: Return if symptoms worsen or fail to improve.   Ortho Exam  Patient is alert, oriented, no adenopathy, well-dressed, normal affect, normal respiratory effort. Examination the incisions are well healed the sutures are harvested. She has no pain with range of motion of the ankle. She does have some swelling there is no redness no cellulitis no pain with range of motion. Patient has a normal gait.  Imaging: No results found. No images are attached to the encounter.  Labs: No results found for: HGBA1C, ESRSEDRATE, CRP, LABURIC, REPTSTATUS, GRAMSTAIN, CULT, LABORGA  Orders:  No orders of the defined types were placed in this encounter.  No orders of the defined types were placed in this encounter.    Procedures: No procedures performed  Clinical Data: No additional findings.  ROS:  All other systems negative, except as noted in the HPI. Review of Systems  Objective: Vital Signs: There were no vitals taken for this visit.  Specialty Comments:  No specialty comments available.  PMFS History: Patient Active Problem List   Diagnosis Date Noted  .  Impingement syndrome of left ankle 07/12/2017  . Rash 01/05/2017  . Unilateral primary osteoarthritis, left knee 11/13/2016  . Acute non-recurrent maxillary sinusitis 09/15/2016  . Mild persistent asthma 11/05/2015  . Gastroesophageal reflux disease without esophagitis 11/05/2015  . Other allergic rhinitis 11/05/2015  . Asthma 07/11/2015  . Lumbar stenosis with neurogenic claudication 10/11/2014   Past Medical History:  Diagnosis Date  . Anxiety    takes Zoloft daily;takes Clonazepam daily as needed  . Arthritis   . Chronic back pain    stenosis  . Diarrhea    occasionally  . GERD (gastroesophageal reflux disease)    takes Omeprazole daily   . History of blood clots 10 yrs ago   left calf after a bone break  . History of bronchitis 2014  . History of colon polyps    benign  . Hyperlipidemia    takes Simvastatin daily  . Insomnia    takes Melatonin nightly  . Joint pain   . Seasonal allergies    takes Claritin daily;Nasonex and ProAir as needed;Singulair daily  . Weakness    numbness and tingling in right leg    History reviewed. No pertinent family history.  Past Surgical History:  Procedure Laterality Date  . ANAL FISSURE REPAIR    . colonosocpy    . D&C of bladder  80yrs ago  . ESOPHAGOGASTRODUODENOSCOPY     with ED  . TONSILLECTOMY    .  WRIST SURGERY Right    with plate   Social History   Occupational History  . Not on file.   Social History Main Topics  . Smoking status: Never Smoker  . Smokeless tobacco: Never Used  . Alcohol use Yes     Comment: wine daily  . Drug use: No  . Sexual activity: Not on file

## 2017-08-25 ENCOUNTER — Ambulatory Visit (INDEPENDENT_AMBULATORY_CARE_PROVIDER_SITE_OTHER): Payer: BLUE CROSS/BLUE SHIELD

## 2017-08-25 DIAGNOSIS — J309 Allergic rhinitis, unspecified: Secondary | ICD-10-CM

## 2017-09-02 HISTORY — PX: ANKLE SURGERY: SHX546

## 2017-09-13 ENCOUNTER — Ambulatory Visit (INDEPENDENT_AMBULATORY_CARE_PROVIDER_SITE_OTHER): Payer: BLUE CROSS/BLUE SHIELD

## 2017-09-13 DIAGNOSIS — J309 Allergic rhinitis, unspecified: Secondary | ICD-10-CM

## 2017-09-28 ENCOUNTER — Ambulatory Visit (INDEPENDENT_AMBULATORY_CARE_PROVIDER_SITE_OTHER): Payer: BLUE CROSS/BLUE SHIELD

## 2017-09-28 DIAGNOSIS — J309 Allergic rhinitis, unspecified: Secondary | ICD-10-CM

## 2017-11-03 ENCOUNTER — Ambulatory Visit (INDEPENDENT_AMBULATORY_CARE_PROVIDER_SITE_OTHER): Payer: BLUE CROSS/BLUE SHIELD | Admitting: *Deleted

## 2017-11-03 DIAGNOSIS — J309 Allergic rhinitis, unspecified: Secondary | ICD-10-CM

## 2017-11-09 ENCOUNTER — Ambulatory Visit (INDEPENDENT_AMBULATORY_CARE_PROVIDER_SITE_OTHER): Payer: BLUE CROSS/BLUE SHIELD

## 2017-11-09 DIAGNOSIS — J309 Allergic rhinitis, unspecified: Secondary | ICD-10-CM

## 2017-11-25 ENCOUNTER — Ambulatory Visit (INDEPENDENT_AMBULATORY_CARE_PROVIDER_SITE_OTHER): Payer: BLUE CROSS/BLUE SHIELD | Admitting: *Deleted

## 2017-11-25 DIAGNOSIS — J309 Allergic rhinitis, unspecified: Secondary | ICD-10-CM

## 2017-12-08 ENCOUNTER — Other Ambulatory Visit: Payer: Self-pay | Admitting: Pediatrics

## 2017-12-09 NOTE — Telephone Encounter (Signed)
RF on Singulair denied, pt needs an OV 

## 2017-12-10 ENCOUNTER — Ambulatory Visit (INDEPENDENT_AMBULATORY_CARE_PROVIDER_SITE_OTHER): Payer: BLUE CROSS/BLUE SHIELD | Admitting: Family

## 2017-12-13 ENCOUNTER — Ambulatory Visit (INDEPENDENT_AMBULATORY_CARE_PROVIDER_SITE_OTHER): Payer: Self-pay

## 2017-12-13 ENCOUNTER — Ambulatory Visit (INDEPENDENT_AMBULATORY_CARE_PROVIDER_SITE_OTHER): Payer: BLUE CROSS/BLUE SHIELD | Admitting: Family

## 2017-12-13 ENCOUNTER — Encounter (INDEPENDENT_AMBULATORY_CARE_PROVIDER_SITE_OTHER): Payer: Self-pay | Admitting: Family

## 2017-12-13 DIAGNOSIS — M7661 Achilles tendinitis, right leg: Secondary | ICD-10-CM

## 2017-12-13 DIAGNOSIS — M79671 Pain in right foot: Secondary | ICD-10-CM | POA: Diagnosis not present

## 2017-12-13 MED ORDER — NITROGLYCERIN 0.2 MG/HR TD PT24: 0.2000 mg | MEDICATED_PATCH | Freq: Every day | TRANSDERMAL | 12 refills | Status: DC

## 2017-12-13 NOTE — Progress Notes (Signed)
Office Visit Note   Patient: Ashley Craig           Date of Birth: 01/06/1951           MRN: [id number] Visit Date: 12/13/2017              Requested by: Cheral BayHawks, Aldene N, MD [address] [place], [place] [id number] PCP: Cheral BayHawks, Aldene N, MD  Chief Complaint  Patient presents with  . Left Ankle - Follow-up, Edema  . Right Ankle - Pain      HPI: Patient is a 67 year old woman seen today for complaint of right achilles pain. Worse with ambulation. Has tried several different shoes. Discomfort with ambulation with most. Has been ongoing for weeks to months. Has tried stretching her achilles with minimal improvement. Has occasionally taken aleve and tylenol for prn pain. No associated injury or sudden onset of symptoms.   Has had return of the pain and intermittent swelling and shooting pain to left ankle but states she knows she might have to deal with this. Is status post scope of left ankle in fall of last year.   Assessment & Plan: Visit Diagnoses:  1. Pain of right heel     Plan: patient was given instructions for Achilles stretching. Placed in a CAM with 2 9/16 heel lifts on right. Will also begin using nitroglycerin patch on right achilles. Will take 2 aleve bid x 2 weeks.   Follow-Up Instructions: No Follow-up on file.   Ortho Exam  Patient is alert, oriented, no adenopathy, well-dressed, normal affect, normal respiratory effort. Examination the left ankle incisions are well healed the sutures are harvested. She does have some swelling there is no redness no cellulitis no pain with range of motion. Patient has a normal gait. The right achilles is tender distally, most so over insertion. Mild swelling posteriorly. No erythema. No palpable defects. Plantar and dorsiflexion intact.  Imaging: No results found. No images are attached to the encounter.  Labs: No results found for: HGBA1C, ESRSEDRATE, CRP, LABURIC, REPTSTATUS, GRAMSTAIN, CULT, LABORGA  Orders:    Orders Placed This Encounter  Procedures  . XR Os Calcis Right   No orders of the defined types were placed in this encounter.    Procedures: No procedures performed  Clinical Data: No additional findings.  ROS:  All other systems negative, except as noted in the HPI. Review of Systems  Constitutional: Negative for chills and fever.  Cardiovascular: Negative for leg swelling.  Musculoskeletal: Positive for arthralgias, joint swelling and myalgias.    Objective: Vital Signs: There were no vitals taken for this visit.  Specialty Comments:  No specialty comments available.  PMFS History: Patient Active Problem List   Diagnosis Date Noted  . Impingement syndrome of left ankle 07/12/2017  . Rash 01/05/2017  . Unilateral primary osteoarthritis, left knee 11/13/2016  . Acute non-recurrent maxillary sinusitis 09/15/2016  . Mild persistent asthma 11/05/2015  . Gastroesophageal reflux disease without esophagitis 11/05/2015  . Other allergic rhinitis 11/05/2015  . Asthma 07/11/2015  . Lumbar stenosis with neurogenic claudication 10/11/2014   Past Medical History:  Diagnosis Date  . Anxiety    takes Zoloft daily;takes Clonazepam daily as needed  . Arthritis   . Chronic back pain    stenosis  . Diarrhea    occasionally  . GERD (gastroesophageal reflux disease)    takes Omeprazole daily   . History of blood clots 10 yrs ago   left calf after  a bone break  . History of bronchitis 2014  . History of colon polyps    benign  . Hyperlipidemia    takes Simvastatin daily  . Insomnia    takes Melatonin nightly  . Joint pain   . Seasonal allergies    takes Claritin daily;Nasonex and ProAir as needed;Singulair daily  . Weakness    numbness and tingling in right leg    History reviewed. No pertinent family history.  Past Surgical History:  Procedure Laterality Date  . ANAL FISSURE REPAIR    . colonosocpy    . D&C of bladder  62yrs ago  . ESOPHAGOGASTRODUODENOSCOPY      with ED  . TONSILLECTOMY    . WRIST SURGERY Right    with plate   Social History   Occupational History  . Not on file  Tobacco Use  . Smoking status: Never Smoker  . Smokeless tobacco: Never Used  Substance and Sexual Activity  . Alcohol use: Yes    Comment: wine daily  . Drug use: No  . Sexual activity: Not on file

## 2017-12-20 ENCOUNTER — Ambulatory Visit (INDEPENDENT_AMBULATORY_CARE_PROVIDER_SITE_OTHER): Payer: BLUE CROSS/BLUE SHIELD

## 2017-12-20 DIAGNOSIS — J309 Allergic rhinitis, unspecified: Secondary | ICD-10-CM | POA: Diagnosis not present

## 2017-12-27 ENCOUNTER — Ambulatory Visit (INDEPENDENT_AMBULATORY_CARE_PROVIDER_SITE_OTHER): Payer: BLUE CROSS/BLUE SHIELD | Admitting: Orthopedic Surgery

## 2017-12-27 ENCOUNTER — Encounter (INDEPENDENT_AMBULATORY_CARE_PROVIDER_SITE_OTHER): Payer: Self-pay | Admitting: Orthopedic Surgery

## 2017-12-27 VITALS — Ht 67.0 in | Wt 207.0 lb

## 2017-12-27 DIAGNOSIS — M25572 Pain in left ankle and joints of left foot: Secondary | ICD-10-CM

## 2017-12-27 DIAGNOSIS — M7661 Achilles tendinitis, right leg: Secondary | ICD-10-CM | POA: Diagnosis not present

## 2017-12-27 NOTE — Progress Notes (Signed)
Office Visit Note   Patient: Ashley Craig           Date of Birth: Feb 01, 1951           MRN: [id number] Visit Date: 12/27/2017              Requested by: Cheral Bay, MD [address] [place], [place] [address] PCP: Cheral Bay, MD  Chief Complaint  Patient presents with  . Right Ankle - Pain, Follow-up    Achilles pain, right  . Left Ankle - Edema, Pain    S/p left ankle debridement for impingement syndrome.      HPI: Patient is a 67 year old woman who presents complaining of insertional Achilles tendinitis on the right she is using nitroglycerin patch she states she is been doing stretches she also complains of impingement anteriorly over the left ankle she is status post arthroscopic debridement of the left ankle last year.  She has been using Tylenol and Aleve as needed.  Patient states that she has had bruising even as a child and when her leg touches the back of the chair she developed bruising in her calf.  Assessment & Plan: Visit Diagnoses:  1. Achilles tendinitis, right leg   2. Pain in left ankle and joints of left foot     Plan: Recommend continue heel cord stretching recommended low impact exercises such as elliptical stationary bicycle and walking.  Recommended CBD lotion.  Discussed that if she cannot resolve her symptoms conservatively we could proceed with a gastrocnemius recession on the right.  Follow-Up Instructions: Return if symptoms worsen or fail to improve.   Ortho Exam  Patient is alert, oriented, no adenopathy, well-dressed, normal affect, normal respiratory effort. Examination patient has a good dorsalis pedis pulse bilaterally.  Left ankle she has dorsiflexion 10 degrees past neutral she is tender to palpation over the anterior lateral joint line.  The Achilles posterior tibial tendon and peroneal tendons are nontender to palpation on the left.  There is no redness no cellulitis.  Examination of her right foot she has  dorsiflexion about 10 degrees short of neutral.  She is point tender to palpation along the Achilles the peroneal and posterior tibial tendon are nontender to palpation her ankle is nontender to palpation.  Imaging: No results found. No images are attached to the encounter.  Labs: No results found for: HGBA1C, ESRSEDRATE, CRP, LABURIC, REPTSTATUS, GRAMSTAIN, CULT, LABORGA  @LABSALLVALUES (HGBA1)@  Body mass index is 32.42 kg/m.  Orders:  No orders of the defined types were placed in this encounter.  No orders of the defined types were placed in this encounter.    Procedures: No procedures performed  Clinical Data: No additional findings.  ROS:  All other systems negative, except as noted in the HPI. Review of Systems  Objective: Vital Signs: Ht 5\' 7"  (1.702 m)   Wt 207 lb (93.9 kg)   BMI 32.42 kg/m   Specialty Comments:  No specialty comments available.  PMFS History: Patient Active Problem List   Diagnosis Date Noted  . Impingement syndrome of left ankle 07/12/2017  . Rash 01/05/2017  . Unilateral primary osteoarthritis, left knee 11/13/2016  . Acute non-recurrent maxillary sinusitis 09/15/2016  . Mild persistent asthma 11/05/2015  . Gastroesophageal reflux disease without esophagitis 11/05/2015  . Other allergic rhinitis 11/05/2015  . Asthma 07/11/2015  . Lumbar stenosis with neurogenic claudication 10/11/2014   Past Medical History:  Diagnosis Date  . Anxiety    takes  Zoloft daily;takes Clonazepam daily as needed  . Arthritis   . Chronic back pain    stenosis  . Diarrhea    occasionally  . GERD (gastroesophageal reflux disease)    takes Omeprazole daily   . History of blood clots 10 yrs ago   left calf after a bone break  . History of bronchitis 2014  . History of colon polyps    benign  . Hyperlipidemia    takes Simvastatin daily  . Insomnia    takes Melatonin nightly  . Joint pain   . Seasonal allergies    takes Claritin daily;Nasonex and  ProAir as needed;Singulair daily  . Weakness    numbness and tingling in right leg    History reviewed. No pertinent family history.  Past Surgical History:  Procedure Laterality Date  . ANAL FISSURE REPAIR    . colonosocpy    . D&C of bladder  9845yrs ago  . ESOPHAGOGASTRODUODENOSCOPY     with ED  . TONSILLECTOMY    . WRIST SURGERY Right    with plate   Social History   Occupational History  . Not on file  Tobacco Use  . Smoking status: Never Smoker  . Smokeless tobacco: Never Used  Substance and Sexual Activity  . Alcohol use: Yes    Comment: wine daily  . Drug use: No  . Sexual activity: Not on file

## 2017-12-29 ENCOUNTER — Other Ambulatory Visit: Payer: Self-pay | Admitting: Pediatrics

## 2018-01-03 ENCOUNTER — Ambulatory Visit: Payer: BLUE CROSS/BLUE SHIELD | Admitting: Pediatrics

## 2018-01-03 ENCOUNTER — Encounter: Payer: Self-pay | Admitting: Pediatrics

## 2018-01-03 VITALS — BP 136/82 | HR 72 | Temp 98.1°F

## 2018-01-03 DIAGNOSIS — J3089 Other allergic rhinitis: Secondary | ICD-10-CM | POA: Diagnosis not present

## 2018-01-03 DIAGNOSIS — K219 Gastro-esophageal reflux disease without esophagitis: Secondary | ICD-10-CM | POA: Diagnosis not present

## 2018-01-03 DIAGNOSIS — J4530 Mild persistent asthma, uncomplicated: Secondary | ICD-10-CM

## 2018-01-03 MED ORDER — MONTELUKAST SODIUM 10 MG PO TABS: 10.0000 mg | ORAL_TABLET | Freq: Every day | ORAL | 3 refills | Status: DC

## 2018-01-03 MED ORDER — ALBUTEROL SULFATE HFA 108 (90 BASE) MCG/ACT IN AERS: 2.0000 | INHALATION_SPRAY | RESPIRATORY_TRACT | 2 refills | Status: DC | PRN

## 2018-01-03 NOTE — Progress Notes (Signed)
[address] [place] [address] Dept: [phone]  FOLLOW UP NOTE  Patient ID: Ashley NajjarJudy V Craig, female    DOB: 02/28/1951  Age: 67 y.o. MRN: [id number] Date of Office Visit: 01/03/2018  Assessment  Chief Complaint: Allergies and Asthma  HPI Ashley Craig is a 67 year old female who presents to the clinic for a follow up visit. She was last in the clinic on 01/05/2017 by Dr. Beaulah DinningBardelas for evaluation of a rash, asthma, allergic rhinitis, and gastroesophageal reflux. At that time, she was continued on her medications including Lamictal 200 mg once a day, montelukast 10 mg once a day, fluticasone nasal spray 2 sprays in each nostril once a day, and Cleritin 10 mg once a day.  At today's visit, she reports asthma as well controlled. She denies shortness of breath, cough, wheeze, and limitation of activity due to asthma symptoms. She is currently taking montelukast 10 mg once a day and is using her ProAir inhaler 1-2 times a day 1-2 times a week for congestion.   Allergic rhinitis is reported as moderately well controlled. She reports an increase in throat clearing and sneezing over the past few weeks. She reports that Spring and Fall are the seasons when she experiences the most symptoms. She uses Flonase nasal spray 1-2 times a week and Astelin nasal spray when she has heavy nasal drainage. She continues with allergen immunotherapy once a month .  Gastroesophageal reflux is reported as well controlled with omeprazole 20 mg once a day as needed. She continues to avoid heavy and spicy meals.   Her current medications are listed in the chart.     Drug Allergies:  Allergies  Allergen Reactions  . Other     Shellfish-Anaphylaxis  . Codeine     nausea    Physical Exam: BP 136/82   Pulse 72   Temp 98.1 F (36.7 C) (Oral)   SpO2 97%    Physical Exam  Constitutional: She is oriented to person, place, and time. She appears well-developed and well-nourished.  HENT:  Head:  Normocephalic and atraumatic.  Right Ear: External ear normal.  Left Ear: External ear normal.  Bilateral nares erythematous and edematous with clear nasal drainage noted. Pharynx clear with no exudate.  Eyes: Conjunctivae are normal.  Neck: Normal range of motion. Neck supple.  Cardiovascular: Normal rate, regular rhythm and normal heart sounds.  S1S2 normal. Regular rate and rhythm. No murmur noted.  Pulmonary/Chest: Effort normal and breath sounds normal.  Lungs clear to auscultation.  Musculoskeletal: Normal range of motion.  Neurological: She is alert and oriented to person, place, and time.  Skin: Skin is warm and dry.  Psychiatric: She has a normal mood and affect. Her behavior is normal. Judgment and thought content normal.    Diagnostics: FVC 3.18, FEV1 2.63. Predicted FVC 3.56, predicted FEV1 2.73. Spirometry is within the normal range.   Assessment and Plan: 1. Mild persistent asthma without complication   2. Other allergic rhinitis   3. Gastroesophageal reflux disease without esophagitis     Meds ordered this encounter  Medications  . montelukast (SINGULAIR) 10 MG tablet    Sig: Take 1 tablet (10 mg total) by mouth at bedtime.    Dispense:  90 tablet    Refill:  3    On hold, pt will call.  Marland Kitchen. albuterol (PROAIR HFA) 108 (90 Base) MCG/ACT inhaler    Sig: Inhale 2 puffs into the lungs every 4 (four) hours as needed for wheezing  or shortness of breath.    Dispense:  1 Inhaler    Refill:  2    Patient Instructions  Claritin 10 mg once a day as needed for runny nose Fluticasone 2 sprays per nostril once a day for the next 3 months Montelukast 10 mg once a day to prevent coughing or wheezing ProAir 2 puffs every 4 hours if needed for wheezing or coughing spells Omeprazole 20 mg twice a day to see if it helps with clearing of the throat Astelin nasal spray one spray in each nostril two times a day as needed for sneezing and nasal itching If your allergies get worse  begin prednisone 10 mg tablets. Take 2 tablets once a day for 4 days, then take 1 tablet for 1 day, then stop. Continue on allergy injections every 4 weeks  Continue on your current medications listed in the chart  Follow up in 6 months or sooner if needed   Return in about 6 months (around 07/06/2018), or if symptoms worsen or fail to improve.   Ashley Craig was seen in the clinic with Dr. Beaulah Dinning today.  I have provided oversight concerning Thermon Leyland' evaluation and treatment of this patient's health issues addressed during today's encounter. I agree with the assessment and therapeutic plan as outlined in the note.    Thank you for the opportunity to care for this patient.  Please do not hesitate to contact me with questions.  Tonette Bihari, M.D.  [facility] [address] [place], [place] [address] [phone]

## 2018-01-03 NOTE — Patient Instructions (Addendum)
Claritin 10 mg once a day as needed for runny nose Fluticasone 2 sprays per nostril once a day for the next 3 months Montelukast 10 mg once a day to prevent coughing or wheezing ProAir 2 puffs every 4 hours if needed for wheezing or coughing spells Omeprazole 20 mg twice a day to see if it helps with clearing of the throat Astelin nasal spray one spray in each nostril two times a day as needed for sneezing and nasal itching If your allergies get worse begin prednisone 10 mg tablets. Take 2 tablets once a day for 4 days, then take 1 tablet for 1 day, then stop. Continue on allergy injections every 4 weeks  Continue on your current medications listed in the chart  Follow up in 6 months or sooner if needed

## 2018-01-06 DIAGNOSIS — J3089 Other allergic rhinitis: Secondary | ICD-10-CM

## 2018-01-06 NOTE — Progress Notes (Unsigned)
VIAL EXP 01-07-19 

## 2018-01-06 NOTE — Progress Notes (Signed)
DID NOT MAKE VIAL THIS DAY. PT DID NOT NEED

## 2018-01-19 ENCOUNTER — Ambulatory Visit (INDEPENDENT_AMBULATORY_CARE_PROVIDER_SITE_OTHER): Payer: BLUE CROSS/BLUE SHIELD | Admitting: *Deleted

## 2018-01-19 DIAGNOSIS — J309 Allergic rhinitis, unspecified: Secondary | ICD-10-CM | POA: Diagnosis not present

## 2018-02-11 ENCOUNTER — Telehealth: Payer: Self-pay | Admitting: Pediatrics

## 2018-02-11 ENCOUNTER — Other Ambulatory Visit: Payer: Self-pay

## 2018-02-11 MED ORDER — OLOPATADINE HCL 0.1 % OP SOLN: 1.0000 [drp] | Freq: Two times a day (BID) | OPHTHALMIC | 1 refills | Status: DC

## 2018-02-11 NOTE — Telephone Encounter (Signed)
Informed pt I sent in rx for eye drops

## 2018-02-11 NOTE — Telephone Encounter (Signed)
Patient did have eye drops that she used during allergy season Patient is currently out of medication [facility] [place] [facility] [place] Please call patient with amy questions

## 2018-02-16 ENCOUNTER — Ambulatory Visit (INDEPENDENT_AMBULATORY_CARE_PROVIDER_SITE_OTHER): Payer: BLUE CROSS/BLUE SHIELD

## 2018-02-16 DIAGNOSIS — J309 Allergic rhinitis, unspecified: Secondary | ICD-10-CM | POA: Diagnosis not present

## 2018-03-15 ENCOUNTER — Ambulatory Visit (INDEPENDENT_AMBULATORY_CARE_PROVIDER_SITE_OTHER): Payer: BLUE CROSS/BLUE SHIELD

## 2018-03-15 DIAGNOSIS — J309 Allergic rhinitis, unspecified: Secondary | ICD-10-CM

## 2018-03-31 ENCOUNTER — Ambulatory Visit (INDEPENDENT_AMBULATORY_CARE_PROVIDER_SITE_OTHER): Payer: BLUE CROSS/BLUE SHIELD

## 2018-03-31 DIAGNOSIS — J309 Allergic rhinitis, unspecified: Secondary | ICD-10-CM | POA: Diagnosis not present

## 2018-04-12 ENCOUNTER — Ambulatory Visit (INDEPENDENT_AMBULATORY_CARE_PROVIDER_SITE_OTHER): Payer: BLUE CROSS/BLUE SHIELD

## 2018-04-12 DIAGNOSIS — J309 Allergic rhinitis, unspecified: Secondary | ICD-10-CM | POA: Diagnosis not present

## 2018-04-18 ENCOUNTER — Encounter: Payer: Self-pay | Admitting: Pediatrics

## 2018-04-18 ENCOUNTER — Ambulatory Visit: Payer: BLUE CROSS/BLUE SHIELD | Admitting: Pediatrics

## 2018-04-18 VITALS — BP 118/72 | HR 85 | Temp 98.9°F | Resp 16

## 2018-04-18 DIAGNOSIS — J3089 Other allergic rhinitis: Secondary | ICD-10-CM

## 2018-04-18 DIAGNOSIS — K219 Gastro-esophageal reflux disease without esophagitis: Secondary | ICD-10-CM

## 2018-04-18 DIAGNOSIS — J4530 Mild persistent asthma, uncomplicated: Secondary | ICD-10-CM

## 2018-04-18 MED ORDER — EPINEPHRINE 0.3 MG/0.3ML IJ SOAJ: INTRAMUSCULAR | 1 refills | Status: DC

## 2018-04-18 NOTE — Progress Notes (Signed)
[address] [place] [address] Dept: [phone]  FOLLOW UP NOTE  Patient ID: Ashley Craig, female    DOB: Nov 16, 1950  Age: 67 y.o. MRN: [id number] Date of Office Visit: 04/18/2018  Assessment  Chief Complaint: Asthma; Sore Throat; Nasal Congestion (drainage ); and Ear Pain (root canal x 1 month ago )  HPI Ashley Craig is a 67 year old female who presents to the clinic for a follow up visit. She was last seen in the clinic on 01/03/2018 by Dr. Beaulah Dinning for evaluation of asthma, allergic rhinitis, and reflux. At that time, her asthma was reported as well controlled and she continued taking montelukast daily and using her albuterol inhaler as needed. Allergic rhinitis was not well controlled and she required a prednisone taper for resolution of symptoms. Her omeprazole was increased to 20 mg twice a day.   At today's visit, she reports her asthma has been well controlled with montelukast. She denies shortness of breath and wheeze. She uses her albuterol once a day.  Allergic rhinitis is reported as not well controlled with thick post nasal drip, voice changes, increased throat clearing, and intermittent head congestion. She denies sinus pressure and headaches. She is currently taking Claritin and using either Flonase or Astelin a few days a week. She reports that she has been having localized itching on her skin where she touches certain plants including rosemary.  Reflux is reported as well controlled with no heartburn with omeprazole 20 mg as needed as well as dietary modifications.   Her current medications are listed in the chart.   Drug Allergies:  Allergies  Allergen Reactions  . Other     Shellfish-Anaphylaxis  . Codeine     nausea    Physical Exam: BP 118/72   Pulse 85   Temp 98.9 F (37.2 C) (Oral)   Resp 16   SpO2 96%    Physical Exam  Constitutional: She is oriented to person, place, and time. She appears well-developed and well-nourished.  HENT:    Head: Normocephalic.  Right Ear: External ear normal.  Left Ear: External ear normal.  Bilateral nares slightly erythematous and edematous with clear nasal drainage. Pharynx slightly erythematous with no exudates noted. Ears normal. Eyes normal.  Eyes: Conjunctivae are normal.  Neck: Normal range of motion. Neck supple.  Cardiovascular: Normal rate, regular rhythm and normal heart sounds.  No murmur noted  Pulmonary/Chest: Effort normal and breath sounds normal.  Lungs clear to auscultation  Musculoskeletal: Normal range of motion.  Neurological: She is alert and oriented to person, place, and time.  Skin: Skin is warm and dry.  No rash noted today in the office  Psychiatric: She has a normal mood and affect. Her behavior is normal. Judgment and thought content normal.    Diagnostics: FVC 2.90, FEV1 2.43. Predicted FVC 3.56, predicted FEV1 2.73. Spirometry is within the normal range.  Assessment and Plan: 1. Mild persistent asthma without complication   2. Other allergic rhinitis   3. Gastroesophageal reflux disease without esophagitis     Meds ordered this encounter  Medications  . EPINEPHrine (AUVI-Q) 0.3 mg/0.3 mL IJ SOAJ injection    Sig: Use as directed for severe allergic reactions    Dispense:  2 Device    Refill:  1    Patient Instructions  Claritin 10 mg once a day as needed for runny nose Add Benadryl 25 mg once a day at bedtime to dry nasal drainage Fluticasone 2 sprays per nostril once  a day as needed for a stuffy nose Astelin nasal spray one spray in each nostril two times a day as needed for sneezing and nasal itching Montelukast 10 mg once a day to prevent coughing or wheezing ProAir 2 puffs every 4 hours if needed for wheezing or coughing spells Omeprazole 20 mg once a day for reflux Lets update your allergy skin testing. Do not take any antihistamines 3 days before your testing Call me if this plan is not working out for you  Continue on allergy  injections every 2 weeks  Continue on your current medications listed in the chart  Follow up in 6 months or sooner if needed   Return in about 6 months (around 10/18/2018), or if symptoms worsen or fail to improve.   Thank you for the opportunity to care for this patient.  Please do not hesitate to contact me with questions.  Thermon LeylandAnne Ambs, FNP [facility]  I have provided oversight concerning Thermon Leylandnne Ambs' evaluation and treatment of this patient's health issues addressed during today's encounter. I agree with the assessment and therapeutic plan as outlined in the note.   Thank you for the opportunity to care for this patient.  Please do not hesitate to contact me with questions.  Tonette BihariJ. A. Bardelas, M.D.  [facility] [address] [place], [place] [address] [phone]

## 2018-04-18 NOTE — Patient Instructions (Signed)
Claritin 10 mg once a day as needed for runny nose Add Benadryl 25 mg once a day at bedtime to dry nasal drainage Fluticasone 2 sprays per nostril once a day as needed for a stuffy nose Astelin nasal spray one spray in each nostril two times a day as needed for sneezing and nasal itching Montelukast 10 mg once a day to prevent coughing or wheezing ProAir 2 puffs every 4 hours if needed for wheezing or coughing spells Omeprazole 20 mg once a day for reflux Lets update your allergy skin testing. Do not take any antihistamines 3 days before your testing Call me if this plan is not working out for you  Continue on allergy injections every 2 weeks  Continue on your current medications listed in the chart  Follow up in 6 months or sooner if needed

## 2018-04-28 ENCOUNTER — Ambulatory Visit (INDEPENDENT_AMBULATORY_CARE_PROVIDER_SITE_OTHER): Payer: BLUE CROSS/BLUE SHIELD

## 2018-04-28 DIAGNOSIS — J309 Allergic rhinitis, unspecified: Secondary | ICD-10-CM

## 2018-05-09 ENCOUNTER — Encounter: Payer: Self-pay | Admitting: Family Medicine

## 2018-05-09 ENCOUNTER — Ambulatory Visit: Payer: BLUE CROSS/BLUE SHIELD | Admitting: Family Medicine

## 2018-05-09 VITALS — BP 138/78 | HR 72 | Temp 98.8°F | Resp 18 | Ht 67.0 in | Wt 205.6 lb

## 2018-05-09 DIAGNOSIS — T7800XD Anaphylactic reaction due to unspecified food, subsequent encounter: Secondary | ICD-10-CM

## 2018-05-09 DIAGNOSIS — H1010 Acute atopic conjunctivitis, unspecified eye: Secondary | ICD-10-CM

## 2018-05-09 DIAGNOSIS — J4530 Mild persistent asthma, uncomplicated: Secondary | ICD-10-CM

## 2018-05-09 DIAGNOSIS — J301 Allergic rhinitis due to pollen: Secondary | ICD-10-CM | POA: Diagnosis not present

## 2018-05-09 DIAGNOSIS — K219 Gastro-esophageal reflux disease without esophagitis: Secondary | ICD-10-CM

## 2018-05-09 NOTE — Patient Instructions (Addendum)
Claritin 10 mg once a day for runny nose and itchy eyes Fluticasone 2 sprays per nostril once a day  for stuffy nose Opcon A one  drop 3 times a day if needed for itchy eyes Montelukast 10 mg once a day to prevent coughing or wheezing Pro-air 2 puffs every 4 hours if needed for wheezing or coughing spells Environmental control of dust and mold Continue omeprazole 20 mg once a day for reflux Continue on your other medications  Return in 2 weeks to start on allergy injections to grass pollen and tree pollens from 1 vial , and weeds and dog from the other vial  Avoid shellfish.  If you have an allergic reaction take Benadryl 50 mg every 4 hours, and  if you have life-threatening symptoms inject with AUVI-Q  0.3 mg

## 2018-05-09 NOTE — Progress Notes (Addendum)
  [address] [place] [address] Dept: [id number]  FOLLOW UP NOTE  Patient ID: Ashley Craig, female    DOB: 12/07/1950  Age: 67 y.o. MRN: [id number] Date of Office Visit: 05/09/2018  Assessment  Chief Complaint: Allergy Testing  HPI Ashley Craig presents for allergy testing.  In the he past few years she has had severe allergic rhinitis and conjunctivitis in the springtime .  She has been on allergy injections  to ragweed and molds for several years .  She wants to get a dog She is on Systane for dry eyes .  She has had a positive challenge to shrimp and continues to avoid shellfish.   Drug Allergies:  Allergies  Allergen Reactions  . Other     Shellfish-Anaphylaxis  . Codeine     nausea    Physical Exam: BP 138/78 (BP Location: Right Arm, Patient Position: Sitting, Cuff Size: Normal)   Pulse 72   Temp 98.8 F (37.1 C) (Oral)   Resp 18   Ht 5\' 7"  (1.702 m)   Wt 205 lb 9.6 oz (93.3 kg)   BMI 32.20 kg/m    Physical Exam  Constitutional: She appears well-developed and well-nourished.  HENT:  Eyes show mild erythema of the palpebral conjunctiva.  Ears normal.  Nose mild swelling of nasal turbinates.  Pharynx normal.  Neck: Neck supple.  Cardiovascular:  S1-S2 normal no murmurs  Pulmonary/Chest:  Clear to percussion and auscultation  Lymphadenopathy:    She has no cervical adenopathy.  Psychiatric: She has a normal mood and affect. Her behavior is normal. Judgment and thought content normal.  Vitals reviewed.   Diagnostics: Allergy skin tests were extremely positive to grass pollens and tree pollens.  She has some reactivity to weeds.  She had slight reactivity to mold.  Positive skin test to cat and dog  Assessment and Plan: 1. Seasonal allergic rhinitis due to pollen   2. Seasonal allergic conjunctivitis   3. Mild persistent asthma without complication   4. Gastroesophageal reflux disease without esophagitis   5. Anaphylactic shock due to food,  subsequent encounter        Patient Instructions  Claritin 10 mg once a day for runny nose and itchy eyes Fluticasone 2 sprays per nostril once a day  for stuffy nose Opcon A one  drop 3 times a day if needed for itchy eyes Montelukast 10 mg once a day to prevent coughing or wheezing Pro-air 2 puffs every 4 hours if needed for wheezing or coughing spells Environmental control of dust and mold Continue omeprazole 20 mg once a day for reflux Continue on your other medications  Return in 2 weeks to start on allergy injections to grass pollen and tree pollens from 1 vial , and weeds and dog from the other vial  Avoid shellfish.  If you have an allergic reaction take Benadryl 50 mg every 4 hours, and  if you have life-threatening symptoms inject with AUVI-Q  0.3 mg   Return in about 2 weeks (around 05/23/2018).    Thank you for the opportunity to care for this patient.  Please do not hesitate to contact me with questions.  Tonette BihariJ. A. Zylie Mumaw, M.D.  [facility] [address] [place], [place] [address] [phone]

## 2018-05-11 ENCOUNTER — Encounter: Payer: Self-pay | Admitting: *Deleted

## 2018-05-11 DIAGNOSIS — T7800XA Anaphylactic reaction due to unspecified food, initial encounter: Secondary | ICD-10-CM | POA: Insufficient documentation

## 2018-05-11 DIAGNOSIS — J301 Allergic rhinitis due to pollen: Secondary | ICD-10-CM | POA: Diagnosis not present

## 2018-05-11 NOTE — Addendum Note (Signed)
Addended by: Stephannie LiBARDELAS, Cadance Raus A on: 05/11/2018 10:34 AM   Modules accepted: Orders

## 2018-05-11 NOTE — Progress Notes (Signed)
4 VIAL SET MADE. EXP: 05-12-19. HV

## 2018-05-12 DIAGNOSIS — J3081 Allergic rhinitis due to animal (cat) (dog) hair and dander: Secondary | ICD-10-CM | POA: Diagnosis not present

## 2018-05-23 ENCOUNTER — Ambulatory Visit (INDEPENDENT_AMBULATORY_CARE_PROVIDER_SITE_OTHER): Payer: BLUE CROSS/BLUE SHIELD

## 2018-05-23 DIAGNOSIS — J309 Allergic rhinitis, unspecified: Secondary | ICD-10-CM

## 2018-05-25 ENCOUNTER — Other Ambulatory Visit: Payer: Self-pay | Admitting: Family Medicine

## 2018-05-25 DIAGNOSIS — Z1231 Encounter for screening mammogram for malignant neoplasm of breast: Secondary | ICD-10-CM

## 2018-05-30 ENCOUNTER — Ambulatory Visit (INDEPENDENT_AMBULATORY_CARE_PROVIDER_SITE_OTHER): Payer: BLUE CROSS/BLUE SHIELD

## 2018-05-30 DIAGNOSIS — J309 Allergic rhinitis, unspecified: Secondary | ICD-10-CM | POA: Diagnosis not present

## 2018-06-07 ENCOUNTER — Ambulatory Visit (INDEPENDENT_AMBULATORY_CARE_PROVIDER_SITE_OTHER): Payer: BLUE CROSS/BLUE SHIELD

## 2018-06-07 DIAGNOSIS — J309 Allergic rhinitis, unspecified: Secondary | ICD-10-CM | POA: Diagnosis not present

## 2018-06-13 ENCOUNTER — Ambulatory Visit (INDEPENDENT_AMBULATORY_CARE_PROVIDER_SITE_OTHER): Payer: BLUE CROSS/BLUE SHIELD

## 2018-06-13 ENCOUNTER — Encounter (INDEPENDENT_AMBULATORY_CARE_PROVIDER_SITE_OTHER): Payer: Self-pay | Admitting: Orthopedic Surgery

## 2018-06-13 ENCOUNTER — Ambulatory Visit (INDEPENDENT_AMBULATORY_CARE_PROVIDER_SITE_OTHER): Payer: BLUE CROSS/BLUE SHIELD | Admitting: Orthopedic Surgery

## 2018-06-13 VITALS — Ht 67.0 in | Wt 205.0 lb

## 2018-06-13 DIAGNOSIS — M25521 Pain in right elbow: Secondary | ICD-10-CM | POA: Diagnosis not present

## 2018-06-13 MED ORDER — MUPIROCIN 2 % EX OINT: 1.0000 "application " | TOPICAL_OINTMENT | Freq: Two times a day (BID) | CUTANEOUS | 3 refills | Status: DC

## 2018-06-15 ENCOUNTER — Ambulatory Visit (INDEPENDENT_AMBULATORY_CARE_PROVIDER_SITE_OTHER): Payer: BLUE CROSS/BLUE SHIELD

## 2018-06-15 DIAGNOSIS — J309 Allergic rhinitis, unspecified: Secondary | ICD-10-CM | POA: Diagnosis not present

## 2018-06-28 ENCOUNTER — Encounter (INDEPENDENT_AMBULATORY_CARE_PROVIDER_SITE_OTHER): Payer: Self-pay | Admitting: Orthopedic Surgery

## 2018-06-28 NOTE — Progress Notes (Signed)
Office Visit Note   Patient: Ashley Craig           Date of Birth: 02/09/1951           MRN: [id number] Visit Date: 06/13/2018              Requested by: Cheral BayHawks, Aldene N, MD [address] [place], [place] [address] PCP: Cheral BayHawks, Aldene N, MD  Chief Complaint  Patient presents with  . Right Elbow - Pain    DOI 06/13/18      HPI: Patient is a 67 year old woman who states that she was pulling weeds and tripped over the hose fell on her driveway landing on the right buttocks and right elbow she complains of pain swelling and abrasion states she has decreased range of motion of the elbow and pain with range of motion.  Assessment & Plan: Visit Diagnoses:  1. Pain in right elbow     Plan: Patient will be called in a prescription for Bactroban to apply to the abrasions.  She will begin slowly working on range of motion for the elbow.  Follow-Up Instructions: Return if symptoms worsen or fail to improve.   Ortho Exam  Patient is alert, oriented, no adenopathy, well-dressed, normal affect, normal respiratory effort. Examination patient has abrasions over the right leg and right elbow.  Patient has no pain with supination or pronation no pain to palpation of the radial head the olecranon is nontender to palpation she has active extension flexion no evidence of a tendon or bony injury.  Patient does have some abrasions over the right leg as well.  The right wrist is nontender to palpation with good range of motion.  Imaging: No results found. No images are attached to the encounter.  Labs: No results found for: HGBA1C, ESRSEDRATE, CRP, LABURIC, REPTSTATUS, GRAMSTAIN, CULT, LABORGA   No results found for: ALBUMIN, PREALBUMIN, LABURIC  Body mass index is 32.11 kg/m.  Orders:  Orders Placed This Encounter  Procedures  . XR Elbow 2 Views Right   Meds ordered this encounter  Medications  . mupirocin ointment (BACTROBAN) 2 %    Sig: Apply 1 application topically 2  (two) times daily. Apply to the affected area 2 times a day    Dispense:  22 g    Refill:  3     Procedures: No procedures performed  Clinical Data: No additional findings.  ROS:  All other systems negative, except as noted in the HPI. Review of Systems  Objective: Vital Signs: Ht 5\' 7"  (1.702 m)   Wt 205 lb (93 kg)   BMI 32.11 kg/m   Specialty Comments:  No specialty comments available.  PMFS History: Patient Active Problem List   Diagnosis Date Noted  . Anaphylactic shock due to adverse food reaction 05/11/2018  . Seasonal allergic conjunctivitis 05/09/2018  . Acute bronchitis due to other specified organisms 07/13/2017  . Impingement syndrome of left ankle 07/12/2017  . Diarrhea 05/21/2017  . Gastroesophageal reflux disease with esophagitis 05/21/2017  . History of colon polyps 05/21/2017  . Orthostatic hypotension 04/08/2017  . Chronic pain of left ankle 03/30/2017  . Rash 01/05/2017  . Unilateral primary osteoarthritis, left knee 11/13/2016  . Allergic dermatitis 08/12/2016  . Mucopurulent chronic bronchitis (HCC) 08/12/2016  . Chronic dryness of both eyes 06/02/2016  . Hypermetropia of both eyes 06/02/2016  . Open angle with borderline findings and low glaucoma risk in both eyes 06/02/2016  . Pinguecula of both eyes 06/02/2016  .  Presbyopia of both eyes 06/02/2016  . Regular astigmatism of both eyes 06/02/2016  . Cellulitis of right lower extremity 01/30/2016  . Varicose veins of lower extremity 01/16/2016  . Varicose veins of left lower extremity with pain 01/16/2016  . Mild persistent asthma without complication 11/05/2015  . Chronic venous insufficiency 08/07/2015  . Asthma 07/11/2015  . Acute frontal sinusitis 01/15/2015  . Spontaneous hematoma of lower leg 11/30/2014  . Spinal stenosis of lumbar region 10/11/2014  . Allergic rhinitis 04/27/2014  . Adjustment disorder 04/27/2014  . Benign essential hypertension 04/27/2014  . Benign paroxysmal  vertigo 04/27/2014  . Chondrocostal junction syndrome 04/27/2014  . Elevation of level of transaminase or lactic acid dehydrogenase (LDH) 04/27/2014  . Elevated blood-pressure reading without diagnosis of hypertension 04/27/2014  . 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 Inflamed seborrheic keratosis 04/27/2014  . Other nonspecific findings on examination of blood(790.99) 04/27/2014  . Overweight 04/27/2014  . Spontaneous ecchymosis 04/27/2014  . Tendonitis of shoulder 04/27/2014  . Unspecified adverse effect of unspecified drug, medicinal and biological substance 04/27/2014  . Gastroesophageal reflux disease without esophagitis 03/27/2014  . Anxiety 03/27/2014  . Environmental allergies 03/27/2014  . Hypercholesteremia 03/27/2014  . Insomnia 03/27/2014  . Vitamin D deficiency 03/27/2014   Past Medical History:  Diagnosis Date  . Anxiety    takes Zoloft daily;takes Clonazepam daily as needed  . Arthritis   . Chronic back pain    stenosis  . Diarrhea    occasionally  . GERD (gastroesophageal reflux disease)    takes Omeprazole daily   . History of blood clots 10 yrs ago   left calf after a bone break  . History of bronchitis 2014  . History of colon polyps    benign  . Hyperlipidemia    takes Simvastatin daily  . Insomnia    takes Melatonin nightly  . Joint pain   . Seasonal allergies    takes Claritin daily;Nasonex and ProAir as needed;Singulair daily  . Weakness    numbness and tingling in right leg    Family History  Problem Relation Age of Onset  . Allergic rhinitis Neg Hx   . Angioedema Neg Hx   . Asthma Neg Hx   . Eczema Neg Hx   . Immunodeficiency Neg Hx   . Urticaria Neg Hx     Past Surgical History:  Procedure Laterality Date  . ANAL FISSURE REPAIR    . ANKLE SURGERY Left 09/2017  . colonosocpy    . D&C of bladder  58yrs ago  . ESOPHAGOGASTRODUODENOSCOPY     with ED  . TONSILLECTOMY    . WRIST SURGERY Right    with plate   Social History    Occupational History  . Not on file  Tobacco Use  . Smoking status: Never Smoker  . Smokeless tobacco: Never Used  Substance and Sexual Activity  . Alcohol use: Yes    Comment: wine daily  . Drug use: No  . Sexual activity: Not on file

## 2018-06-29 ENCOUNTER — Ambulatory Visit (INDEPENDENT_AMBULATORY_CARE_PROVIDER_SITE_OTHER): Payer: BLUE CROSS/BLUE SHIELD | Admitting: *Deleted

## 2018-06-29 DIAGNOSIS — J309 Allergic rhinitis, unspecified: Secondary | ICD-10-CM | POA: Diagnosis not present

## 2018-07-01 ENCOUNTER — Ambulatory Visit
Admission: RE | Admit: 2018-07-01 | Discharge: 2018-07-01 | Disposition: A | Discharge status: Home / Self Care | Payer: BLUE CROSS/BLUE SHIELD | Source: Ambulatory Visit | Attending: Family Medicine | Admitting: Family Medicine

## 2018-07-01 DIAGNOSIS — Z1231 Encounter for screening mammogram for malignant neoplasm of breast: Secondary | ICD-10-CM

## 2018-07-05 ENCOUNTER — Ambulatory Visit (INDEPENDENT_AMBULATORY_CARE_PROVIDER_SITE_OTHER): Payer: BLUE CROSS/BLUE SHIELD | Admitting: Family Medicine

## 2018-07-06 ENCOUNTER — Ambulatory Visit (INDEPENDENT_AMBULATORY_CARE_PROVIDER_SITE_OTHER): Payer: BLUE CROSS/BLUE SHIELD

## 2018-07-06 DIAGNOSIS — J309 Allergic rhinitis, unspecified: Secondary | ICD-10-CM | POA: Diagnosis not present

## 2018-07-12 ENCOUNTER — Ambulatory Visit (INDEPENDENT_AMBULATORY_CARE_PROVIDER_SITE_OTHER): Payer: BLUE CROSS/BLUE SHIELD | Admitting: Orthopedic Surgery

## 2018-07-12 ENCOUNTER — Encounter (INDEPENDENT_AMBULATORY_CARE_PROVIDER_SITE_OTHER): Payer: Self-pay | Admitting: Orthopedic Surgery

## 2018-07-12 ENCOUNTER — Ambulatory Visit (INDEPENDENT_AMBULATORY_CARE_PROVIDER_SITE_OTHER): Payer: BLUE CROSS/BLUE SHIELD

## 2018-07-12 VITALS — Ht 67.0 in | Wt 205.0 lb

## 2018-07-12 DIAGNOSIS — J309 Allergic rhinitis, unspecified: Secondary | ICD-10-CM

## 2018-07-12 DIAGNOSIS — M7711 Lateral epicondylitis, right elbow: Secondary | ICD-10-CM

## 2018-07-12 MED ORDER — PREDNISONE 10 MG PO TABS: 20.0000 mg | ORAL_TABLET | Freq: Every day | ORAL | 0 refills | Status: DC

## 2018-07-12 NOTE — Progress Notes (Signed)
Office Visit Note   Patient: Ashley Craig           Date of Birth: 04/04/1951           MRN: [id number] Visit Date: 07/12/2018              Requested by: Cheral Bay, MD [address] [place], [place] [address] PCP: Cheral Bay, MD  Chief Complaint  Patient presents with  . Right Elbow - Pain    S/p fall DOI 06/13/18      HPI: Patient is a 67 year old woman who states that she fell on her driveway on 06/13/2018.  She states that radiographs were obtained and these were within normal limits.  Patient states she is unable to perform activities of daily living due to lateral epicondyle pain of the right elbow.  She states she cannot raise her arm to feed herself.  Patient states she also has some new buttocks pain which hurts to sit.  Patient states that this is recent and has not been present since the accident.  Assessment & Plan: Visit Diagnoses:  1. Lateral epicondylitis, right elbow     Plan: Patient's lateral epicondylitis was injected she is given a prescription for prednisone to help with her neck and back symptoms.  She will wean off the medication as instructed.  Follow-Up Instructions: Return if symptoms worsen or fail to improve.   Ortho Exam  Patient is alert, oriented, no adenopathy, well-dressed, normal affect, normal respiratory effort. Examination patient has a normal gait.  Negative straight leg raise bilaterally no focal motor weakness in either lower extremity.  Semination of the right elbow of the olecranon and the medial epicondyle are nontender to palpation she is point tender to palpation over the lateral epicondyle right elbow.  Resisted extension of the wrist reproduces her symptoms.  Imaging: No results found. No images are attached to the encounter.  Labs: No results found for: HGBA1C, ESRSEDRATE, CRP, LABURIC, REPTSTATUS, GRAMSTAIN, CULT, LABORGA   No results found for: ALBUMIN, PREALBUMIN, LABURIC  Body mass index is 32.11  kg/m.  Orders:  No orders of the defined types were placed in this encounter.  No orders of the defined types were placed in this encounter.    Procedures: Medium Joint Inj: R lateral epicondyle on 07/12/2018 2:07 PM Indications: pain and diagnostic evaluation Details: 22 G 1.5 in needle, lateral approach Medications: 2 mL lidocaine 1 %; 40 mg methylPREDNISolone acetate 40 MG/ML Outcome: tolerated well, no immediate complications Procedure, treatment alternatives, risks and benefits explained, specific risks discussed. Consent was given by the patient. Immediately prior to procedure a time out was called to verify the correct patient, procedure, equipment, support staff and site/side marked as required. Patient was prepped and draped in the usual sterile fashion.      Clinical Data: No additional findings.  ROS:  All other systems negative, except as noted in the HPI. Review of Systems  Objective: Vital Signs: Ht 5\' 7"  (1.702 m)   Wt 205 lb (93 kg)   BMI 32.11 kg/m   Specialty Comments:  No specialty comments available.  PMFS History: Patient Active Problem List   Diagnosis Date Noted  . Anaphylactic shock due to adverse food reaction 05/11/2018  . Seasonal allergic conjunctivitis 05/09/2018  . Acute bronchitis due to other specified organisms 07/13/2017  . Impingement syndrome of left ankle 07/12/2017  . Diarrhea 05/21/2017  . Gastroesophageal reflux disease with esophagitis 05/21/2017  . History of  colon polyps 05/21/2017  . Orthostatic hypotension 04/08/2017  . Chronic pain of left ankle 03/30/2017  . Rash 01/05/2017  . Unilateral primary osteoarthritis, left knee 11/13/2016  . Allergic dermatitis 08/12/2016  . Mucopurulent chronic bronchitis (HCC) 08/12/2016  . Chronic dryness of both eyes 06/02/2016  . Hypermetropia of both eyes 06/02/2016  . Open angle with borderline findings and low glaucoma risk in both eyes 06/02/2016  . Pinguecula of both eyes  06/02/2016  . Presbyopia of both eyes 06/02/2016  . Regular astigmatism of both eyes 06/02/2016  . Cellulitis of right lower extremity 01/30/2016  . Varicose veins of lower extremity 01/16/2016  . Varicose veins of left lower extremity with pain 01/16/2016  . Mild persistent asthma without complication 11/05/2015  . Chronic venous insufficiency 08/07/2015  . Asthma 07/11/2015  . Acute frontal sinusitis 01/15/2015  . Spontaneous hematoma of lower leg 11/30/2014  . Spinal stenosis of lumbar region 10/11/2014  . Allergic rhinitis 04/27/2014  . Adjustment disorder 04/27/2014  . Benign essential hypertension 04/27/2014  . Benign paroxysmal vertigo 04/27/2014  . Chondrocostal junction syndrome 04/27/2014  . Elevation of level of transaminase or lactic acid dehydrogenase (LDH) 04/27/2014  . Elevated blood-pressure reading without diagnosis of hypertension 04/27/2014  . 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 Inflamed seborrheic keratosis 04/27/2014  . Other nonspecific findings on examination of blood(790.99) 04/27/2014  . Overweight 04/27/2014  . Spontaneous ecchymosis 04/27/2014  . Tendonitis of shoulder 04/27/2014  . Unspecified adverse effect of unspecified drug, medicinal and biological substance 04/27/2014  . Gastroesophageal reflux disease without esophagitis 03/27/2014  . Anxiety 03/27/2014  . Environmental allergies 03/27/2014  . Hypercholesteremia 03/27/2014  . Insomnia 03/27/2014  . Vitamin D deficiency 03/27/2014   Past Medical History:  Diagnosis Date  . Anxiety    takes Zoloft daily;takes Clonazepam daily as needed  . Arthritis   . Chronic back pain    stenosis  . Diarrhea    occasionally  . GERD (gastroesophageal reflux disease)    takes Omeprazole daily   . History of blood clots 10 yrs ago   left calf after a bone break  . History of bronchitis 2014  . History of colon polyps    benign  . Hyperlipidemia    takes Simvastatin daily  . Insomnia    takes  Melatonin nightly  . Joint pain   . Seasonal allergies    takes Claritin daily;Nasonex and ProAir as needed;Singulair daily  . Weakness    numbness and tingling in right leg    Family History  Problem Relation Age of Onset  . Allergic rhinitis Neg Hx   . Angioedema Neg Hx   . Asthma Neg Hx   . Eczema Neg Hx   . Immunodeficiency Neg Hx   . Urticaria Neg Hx     Past Surgical History:  Procedure Laterality Date  . ANAL FISSURE REPAIR    . ANKLE SURGERY Left 09/2017  . colonosocpy    . D&C of bladder  91yrs ago  . ESOPHAGOGASTRODUODENOSCOPY     with ED  . TONSILLECTOMY    . WRIST SURGERY Right    with plate   Social History   Occupational History  . Not on file  Tobacco Use  . Smoking status: Never Smoker  . Smokeless tobacco: Never Used  Substance and Sexual Activity  . Alcohol use: Yes    Comment: wine daily  . Drug use: No  . Sexual activity: Not on file

## 2018-07-12 NOTE — Progress Notes (Signed)
See note

## 2018-07-25 ENCOUNTER — Ambulatory Visit (INDEPENDENT_AMBULATORY_CARE_PROVIDER_SITE_OTHER): Payer: BLUE CROSS/BLUE SHIELD | Admitting: *Deleted

## 2018-07-25 DIAGNOSIS — J309 Allergic rhinitis, unspecified: Secondary | ICD-10-CM | POA: Diagnosis not present

## 2018-07-25 MED ORDER — METHYLPREDNISOLONE ACETATE 40 MG/ML IJ SUSP
40.0000 mg | INTRAMUSCULAR | Status: AC | PRN
  Administered 2018-07-12: 40 mg via INTRA_ARTICULAR

## 2018-07-25 MED ORDER — LIDOCAINE HCL 1 % IJ SOLN
2.0000 mL | INTRAMUSCULAR | Status: AC | PRN
  Administered 2018-07-12: 2 mL

## 2018-08-04 ENCOUNTER — Encounter (INDEPENDENT_AMBULATORY_CARE_PROVIDER_SITE_OTHER): Payer: Self-pay

## 2018-08-04 ENCOUNTER — Other Ambulatory Visit (INDEPENDENT_AMBULATORY_CARE_PROVIDER_SITE_OTHER): Payer: Self-pay | Admitting: Orthopedic Surgery

## 2018-08-04 ENCOUNTER — Ambulatory Visit (INDEPENDENT_AMBULATORY_CARE_PROVIDER_SITE_OTHER): Payer: Medicare HMO

## 2018-08-04 DIAGNOSIS — J309 Allergic rhinitis, unspecified: Secondary | ICD-10-CM

## 2018-08-04 NOTE — Telephone Encounter (Signed)
Do you want to refill? 

## 2018-08-11 ENCOUNTER — Ambulatory Visit (INDEPENDENT_AMBULATORY_CARE_PROVIDER_SITE_OTHER): Payer: Medicare HMO

## 2018-08-11 DIAGNOSIS — J309 Allergic rhinitis, unspecified: Secondary | ICD-10-CM

## 2018-08-18 ENCOUNTER — Ambulatory Visit (INDEPENDENT_AMBULATORY_CARE_PROVIDER_SITE_OTHER): Payer: Medicare HMO

## 2018-08-18 DIAGNOSIS — J309 Allergic rhinitis, unspecified: Secondary | ICD-10-CM

## 2018-08-24 ENCOUNTER — Ambulatory Visit (INDEPENDENT_AMBULATORY_CARE_PROVIDER_SITE_OTHER): Payer: Medicare HMO | Admitting: *Deleted

## 2018-08-24 DIAGNOSIS — J309 Allergic rhinitis, unspecified: Secondary | ICD-10-CM

## 2018-08-25 ENCOUNTER — Telehealth: Payer: Self-pay

## 2018-08-25 NOTE — Telephone Encounter (Signed)
Copied from CRM [id number]. Topic: Appointment Scheduling - Scheduling Inquiry for Clinic >> Aug 25, 2018  3:24 PM Ashley Craig, [place] wrote: Reason for CRM: Calling and states that her friend Ashley Craig sent an email to Dr Patsy Lager about accepting Ashley Craig as a new patient on Oct 23rd and was told that Dr Patsy Lager would accept her. Please advise.  CB#: [phone]

## 2018-08-25 NOTE — Telephone Encounter (Signed)
Please advise 

## 2018-08-25 NOTE — Telephone Encounter (Signed)
Yes, I will see her 

## 2018-08-26 NOTE — Telephone Encounter (Signed)
Okay to schedule NP appt at her convenience.  

## 2018-08-29 NOTE — Telephone Encounter (Signed)
Pt is scheduled for 12/07/2018 @ 9:30

## 2018-08-30 ENCOUNTER — Other Ambulatory Visit (INDEPENDENT_AMBULATORY_CARE_PROVIDER_SITE_OTHER): Payer: Self-pay | Admitting: Orthopedic Surgery

## 2018-08-30 ENCOUNTER — Telehealth: Payer: Self-pay

## 2018-08-30 NOTE — Telephone Encounter (Deleted)
Ok per Dr. Patsy Lager, please schedule this patient for new patient appointment. Patient is friend/relative of Mariann Barter Dr. Brayton Layman patient.

## 2018-08-30 NOTE — Telephone Encounter (Signed)
Patient has been scheduled for new patient appointment per Dr. Patsy Lager in 12/07/17

## 2018-09-14 ENCOUNTER — Ambulatory Visit (INDEPENDENT_AMBULATORY_CARE_PROVIDER_SITE_OTHER): Payer: Medicare HMO | Admitting: *Deleted

## 2018-09-14 DIAGNOSIS — J309 Allergic rhinitis, unspecified: Secondary | ICD-10-CM

## 2018-09-22 ENCOUNTER — Ambulatory Visit (INDEPENDENT_AMBULATORY_CARE_PROVIDER_SITE_OTHER): Payer: Medicare HMO | Admitting: *Deleted

## 2018-09-22 DIAGNOSIS — J309 Allergic rhinitis, unspecified: Secondary | ICD-10-CM

## 2018-09-26 ENCOUNTER — Other Ambulatory Visit (INDEPENDENT_AMBULATORY_CARE_PROVIDER_SITE_OTHER): Payer: Self-pay | Admitting: Orthopedic Surgery

## 2018-09-26 ENCOUNTER — Ambulatory Visit (INDEPENDENT_AMBULATORY_CARE_PROVIDER_SITE_OTHER): Payer: Medicare HMO

## 2018-09-26 DIAGNOSIS — J309 Allergic rhinitis, unspecified: Secondary | ICD-10-CM | POA: Diagnosis not present

## 2018-10-04 ENCOUNTER — Ambulatory Visit (INDEPENDENT_AMBULATORY_CARE_PROVIDER_SITE_OTHER): Payer: Medicare HMO

## 2018-10-04 DIAGNOSIS — J309 Allergic rhinitis, unspecified: Secondary | ICD-10-CM | POA: Diagnosis not present

## 2018-10-06 ENCOUNTER — Telehealth: Payer: Self-pay

## 2018-10-06 NOTE — Telephone Encounter (Signed)
Pt called stating her allergy shots were given 10/05/18 and she woke up the next morning with a reaction on her R arm under the arm almost to her elbow red and itchy. She took benadryl and cortisone cream.

## 2018-10-10 ENCOUNTER — Ambulatory Visit (INDEPENDENT_AMBULATORY_CARE_PROVIDER_SITE_OTHER): Payer: Medicare HMO

## 2018-10-10 DIAGNOSIS — J3089 Other allergic rhinitis: Secondary | ICD-10-CM

## 2018-10-10 DIAGNOSIS — J309 Allergic rhinitis, unspecified: Secondary | ICD-10-CM

## 2018-10-17 ENCOUNTER — Ambulatory Visit (INDEPENDENT_AMBULATORY_CARE_PROVIDER_SITE_OTHER): Payer: Medicare HMO

## 2018-10-17 DIAGNOSIS — J309 Allergic rhinitis, unspecified: Secondary | ICD-10-CM | POA: Diagnosis not present

## 2018-10-24 ENCOUNTER — Ambulatory Visit (INDEPENDENT_AMBULATORY_CARE_PROVIDER_SITE_OTHER): Payer: Medicare HMO

## 2018-10-24 DIAGNOSIS — J301 Allergic rhinitis due to pollen: Secondary | ICD-10-CM | POA: Diagnosis not present

## 2018-11-07 ENCOUNTER — Ambulatory Visit (INDEPENDENT_AMBULATORY_CARE_PROVIDER_SITE_OTHER): Payer: Medicare HMO

## 2018-11-07 DIAGNOSIS — J309 Allergic rhinitis, unspecified: Secondary | ICD-10-CM

## 2018-11-14 ENCOUNTER — Ambulatory Visit (INDEPENDENT_AMBULATORY_CARE_PROVIDER_SITE_OTHER): Payer: Medicare HMO

## 2018-11-14 DIAGNOSIS — J309 Allergic rhinitis, unspecified: Secondary | ICD-10-CM

## 2018-11-22 ENCOUNTER — Ambulatory Visit (INDEPENDENT_AMBULATORY_CARE_PROVIDER_SITE_OTHER): Payer: Medicare HMO

## 2018-11-22 DIAGNOSIS — J309 Allergic rhinitis, unspecified: Secondary | ICD-10-CM

## 2018-11-28 ENCOUNTER — Ambulatory Visit (INDEPENDENT_AMBULATORY_CARE_PROVIDER_SITE_OTHER): Payer: Medicare HMO

## 2018-11-28 DIAGNOSIS — J309 Allergic rhinitis, unspecified: Secondary | ICD-10-CM | POA: Diagnosis not present

## 2018-12-04 NOTE — Progress Notes (Addendum)
[facility] [address], [address] [place], [place] [address] [phone] [phone]  Date:  12/07/2018   Name:  Ashley NajjarJudy V Wyman   DOB:  11/14/1950   MRN:  [id number]  PCP:  Pearline Cablesopland, Darrious Youman C, MD    Chief Complaint: New Patient (Initial Visit) (no concerns, establish care)   History of Present Illness:  Ashley Craig is a 68 y.o. very pleasant female patient who presents with the following:  Here today as a new patient, she was referred to me by a friend who is also my patient  She sees asthma and allergy for allergy shots It looks like she is transferring from a [facility] History of chronic hypertension, chronic back pain  We will need to get a list for specialist She [place], got married and moved around the country for a while, ended up in this area.  She has an identical twin sister and older brother who had melanoma She is divorced, has 2 grands by marriage.  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  This does help   Dr. Arnetha Gulaadionchenko is her optho- per pt she has borderline pressure but is not on drops for this  Dr. Danae ChenBardellis is her allergist  She did have a lower lumber fusion per [place] about 5 years ago  Her general ortho is Dr. Lajoyce Cornersuda She does not see GYN any longer  Her psychiatric care provider is Salome HolmesLeslie Oneil- NP  She is on zocor for her lipids and  perindopril for her BP  She has a history of GAD and is treated with  Klonopin lamictal zoloft temazepam  She generally does labs annually- would like to do today mammo is UTD  She could use a bone density - we can do this here for her  She is UTD on her colonoscopy   [facility]: she did have an abnl years ago, all ok since. 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 Seasonal allergic conjunctivitis 05/09/2018  . Impingement syndrome of left ankle 07/12/2017  . Diarrhea 05/21/2017  . Gastroesophageal reflux disease with esophagitis 05/21/2017  . History of colon polyps 05/21/2017  . Orthostatic hypotension 04/08/2017  . Chronic pain of left ankle 03/30/2017  . Unilateral primary osteoarthritis, left knee 11/13/2016  . Allergic dermatitis 08/12/2016  . Chronic dryness of both eyes 06/02/2016  . Hypermetropia of both eyes 06/02/2016  . Open angle with borderline findings and low glaucoma risk in both eyes 06/02/2016  . Pinguecula of both eyes 06/02/2016  . Presbyopia of both eyes 06/02/2016  . Regular astigmatism of both eyes 06/02/2016  . Cellulitis of right lower extremity 01/30/2016  .  Varicose veins of left lower extremity with pain 01/16/2016  . Mild persistent asthma without complication 11/05/2015  . Chronic venous insufficiency 08/07/2015  . Spontaneous hematoma of lower leg 11/30/2014  . Spinal stenosis of lumbar region 10/11/2014  . Allergic rhinitis 04/27/2014  . Adjustment disorder 04/27/2014  . Benign essential hypertension 04/27/2014  . Benign paroxysmal vertigo 04/27/2014  . Chondrocostal junction syndrome 04/27/2014  . Elevation of level of transaminase or lactic acid dehydrogenase (LDH) 04/27/2014  . 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 Overweight 04/27/2014  . Spontaneous ecchymosis 04/27/2014  . Tendonitis of  shoulder 04/27/2014  . Anxiety 03/27/2014  . Environmental allergies 03/27/2014  . Hypercholesteremia 03/27/2014  . Insomnia 03/27/2014  . Vitamin D deficiency 03/27/2014    Past Medical History:  Diagnosis Date  . Anxiety    takes Zoloft daily;takes Clonazepam daily as needed  . Arthritis   . Chronic back pain    stenosis  . Diarrhea    occasionally  . GERD (gastroesophageal reflux disease)    takes Omeprazole daily   . History of blood clots 10 yrs ago   left calf after a bone break  . History of bronchitis 2014  . History of colon polyps    benign  . Hyperlipidemia    takes Simvastatin daily  . Hypertension   . Insomnia    takes Melatonin nightly  . Joint pain   . Seasonal allergies    takes Claritin daily;Nasonex and ProAir as needed;Singulair daily  . Weakness    numbness and tingling in right leg    Past Surgical History:  Procedure Laterality Date  . ANAL FISSURE REPAIR    . ANKLE SURGERY Left 09/2017  . colonosocpy    . D&C of bladder  68yrs ago  . ESOPHAGOGASTRODUODENOSCOPY     with ED  . TONSILLECTOMY    . WRIST SURGERY Right    with plate    Social History   Tobacco Use  . Smoking status: Never Smoker  . Smokeless tobacco: Never Used  Substance Use Topics  . Alcohol use: Yes    Comment: wine daily  . Drug use: No    Family History  Problem Relation Age of Onset  . Allergic rhinitis Neg Hx   . Angioedema Neg Hx   . Asthma Neg Hx   . Eczema Neg Hx   . Immunodeficiency Neg Hx   . Urticaria Neg Hx     Allergies  Allergen Reactions  . Other     Shellfish-Anaphylaxis  . Codeine     nausea    Medication list has been reviewed and updated.  Current Outpatient Medications on File Prior to Visit  Medication Sig Dispense Refill  . albuterol (PROAIR HFA) 108 (90 Base) MCG/ACT inhaler Inhale 2 puffs into the lungs every 4 (four) hours as needed for wheezing or shortness of breath. 1 Inhaler 2  . clonazePAM (KLONOPIN) 0.5 MG tablet Take  0.5 mg by mouth daily as needed for anxiety.    . diclofenac sodium (VOLTAREN) 1 % GEL APPLY TO AFFECTED AREA 1-3 TIMES A DAY FOR 30 DAYS  1  . EPINEPHrine (AUVI-Q) 0.3 mg/0.3 mL IJ SOAJ injection Use as directed for severe allergic reactions 2 Device 1  . fluticasone (FLONASE) 50 MCG/ACT nasal spray DISPENSE 2 SPRAYS INTO EACH NOSTRIL ONCE DAILY FOR DRAINAGE 16 g 5  . ibuprofen (ADVIL,MOTRIN) 200 MG tablet Take 400 mg by mouth 3 (three) times daily as needed. Reported on 11/05/2015    .  lamoTRIgine (LAMICTAL) 200 MG tablet Take 200 mg by mouth daily.  0  . loratadine (CLARITIN) 10 MG tablet Take 10 mg by mouth daily.    . Melatonin 3 MG TABS Take 3 mg by mouth at bedtime. Reported on 11/05/2015    . montelukast (SINGULAIR) 10 MG tablet Take 1 tablet (10 mg total) by mouth at bedtime. 90 tablet 3  . olopatadine (PATANOL) 0.1 % ophthalmic solution Place 1 drop into both eyes 2 (two) times daily. 5 mL 1  . omeprazole (PRILOSEC) 20 MG capsule Take 20 mg by mouth daily.    . perindopril (ACEON) 4 MG tablet Take 4 mg by mouth.    . sertraline (ZOLOFT) 100 MG tablet TAKE 2 TABLETS BY MOUTH EVERY DAY    . simvastatin (ZOCOR) 20 MG tablet Take one tablet daily **last refill needs to be seen**    . temazepam (RESTORIL) 30 MG capsule Take 30 mg by mouth at bedtime as needed for sleep. Reported on 11/05/2015    . azelastine (ASTELIN) 0.1 % nasal spray 1 spray by Each Nare route Two (2) times a day. Use in each nostril as directed    . NON FORMULARY      No current facility-administered medications on file prior to visit.     Review of Systems:  As per HPI- otherwise negative. Feeling well Depression and anxiety are under control   Physical Examination: Vitals:   12/07/18 0935  BP: 124/78  Pulse: 90  Resp: 16  Temp: 98.2 F (36.8 C)  SpO2: 98%   Vitals:   12/07/18 0935  Weight: 195 lb (88.5 kg)  Height: 5\' 7"  (1.702 m)   Body mass index is 30.54 kg/m. Ideal Body Weight: Weight in (lb) to  have BMI = 25: 159.3  GEN: WDWN, NAD, Non-toxic, A & O x 3, obese, looks well  HEENT: Atraumatic, Normocephalic. Neck supple. No masses, No LAD.  Bilateral TM wnl, oropharynx normal.  PEERL,EOMI.   Ears and Nose: No external deformity. CV: RRR, No M/G/R. No JVD. No thrill. No extra heart sounds. PULM: CTA B, no wheezes, crackles, rhonchi. No retractions. No resp. distress. No accessory muscle use. EXTR: No c/c/e NEURO Normal gait.  PSYCH: Normally interactive. Conversant. Not depressed or anxious appearing.  Calm demeanor.    Assessment and Plan: Estrogen deficiency - Plan: DG Bone Density  Screening for osteoporosis - Plan: DG Bone Density  Screening for deficiency anemia - Plan: CBC  Screening for diabetes mellitus - Plan: Comprehensive metabolic panel, Hemoglobin A1c  Hyperlipidemia, unspecified hyperlipidemia type - Plan: Lipid panel  Essential hypertension - Plan: Comprehensive metabolic panel  Here today for an office visit to establish care Ordered a bone density for her, mammo is UTD Labs pending as above I have reviewed her chart and made phone calls to gather health maint info  >45 minutes spent in face to face time with patient, >50% spent in counselling or coordination of care  We plan to visit in 6 months  Signed Abbe Amsterdam, MD  Received her labs, letter to patient but called re: potassium   LMOM that her labs are ok except potassium is a bit high -this is not high enough to be acutely dangerous, but I would like to repeat it.  I have placed a lab order, and would ask her to please have this repeated in the next couple of weeks  Results for orders placed or performed in visit on 12/07/18  CBC  Result Value  Ref Range   WBC 5.3 4.0 - 10.5 K/uL   RBC 4.98 3.87 - 5.11 Mil/uL   Platelets 291.0 150.0 - 400.0 K/uL   Hemoglobin 14.5 12.0 - 15.0 g/dL   HCT 40.943.3 81.136.0 - 91.446.0 %   MCV 86.9 78.0 - 100.0 fl   MCHC 33.6 30.0 - 36.0 g/dL   RDW 78.213.3 95.611.5 - 21.315.5 %   Comprehensive metabolic panel  Result Value Ref Range   Sodium 140 135 - 145 mEq/L   Potassium 5.5 (H) 3.5 - 5.1 mEq/L   Chloride 104 96 - 112 mEq/L   CO2 28 19 - 32 mEq/L   Glucose, Bld 90 70 - 99 mg/dL   BUN 14 6 - 23 mg/dL   Creatinine, Ser 0.860.78 0.40 - 1.20 mg/dL   Total Bilirubin 0.5 0.2 - 1.2 mg/dL   Alkaline Phosphatase 122 (H) 39 - 117 U/L   AST 19 0 - 37 U/L   ALT 26 0 - 35 U/L   Total Protein 6.8 6.0 - 8.3 g/dL   Albumin 4.7 3.5 - 5.2 g/dL   Calcium 9.5 8.4 - 57.810.5 mg/dL   GFR 46.9674.04 >29.52>60.00 mL/min  Hemoglobin A1c  Result Value Ref Range   Hgb A1c MFr Bld 5.4 4.6 - 6.5 %  Lipid panel  Result Value Ref Range   Cholesterol 245 (H) 0 - 200 mg/dL   Triglycerides 84.197.0 0.0 - 149.0 mg/dL   HDL 32.4481.10 >01.02>39.00 mg/dL   VLDL 72.519.4 0.0 - 36.640.0 mg/dL   LDL Cholesterol 440144 (H) 0 - 99 mg/dL   Total CHOL/HDL Ratio 3    NonHDL 163.47    The 10-year ASCVD risk score Denman George(Goff DC Jr., et al., 2013) is: 6.6%   Values used to calculate the score:     Age: [age] years     Sex: Female     Is Non-Hispanic African American: No     Diabetic: No     Tobacco smoker: No     Systolic Blood Pressure: 124 mmHg     Is BP treated: Yes     HDL Cholesterol: 81.1 mg/dL     Total Cholesterol: 245 mg/dL

## 2018-12-05 ENCOUNTER — Ambulatory Visit (INDEPENDENT_AMBULATORY_CARE_PROVIDER_SITE_OTHER): Payer: Medicare HMO

## 2018-12-05 DIAGNOSIS — J309 Allergic rhinitis, unspecified: Secondary | ICD-10-CM

## 2018-12-07 ENCOUNTER — Encounter: Payer: Self-pay | Admitting: Family Medicine

## 2018-12-07 ENCOUNTER — Ambulatory Visit (INDEPENDENT_AMBULATORY_CARE_PROVIDER_SITE_OTHER): Payer: Medicare HMO | Admitting: Family Medicine

## 2018-12-07 VITALS — BP 124/78 | HR 90 | Temp 98.2°F | Resp 16 | Ht 67.0 in | Wt 195.0 lb

## 2018-12-07 DIAGNOSIS — I10 Essential (primary) hypertension: Secondary | ICD-10-CM

## 2018-12-07 DIAGNOSIS — Z13820 Encounter for screening for osteoporosis: Secondary | ICD-10-CM | POA: Diagnosis not present

## 2018-12-07 DIAGNOSIS — Z131 Encounter for screening for diabetes mellitus: Secondary | ICD-10-CM

## 2018-12-07 DIAGNOSIS — E785 Hyperlipidemia, unspecified: Secondary | ICD-10-CM | POA: Diagnosis not present

## 2018-12-07 DIAGNOSIS — E2839 Other primary ovarian failure: Secondary | ICD-10-CM | POA: Diagnosis not present

## 2018-12-07 DIAGNOSIS — Z130 Encounter for screening for diseases of the blood and blood-forming organs and certain disorders involving the immune mechanism: Secondary | ICD-10-CM | POA: Diagnosis not present

## 2018-12-07 DIAGNOSIS — E875 Hyperkalemia: Secondary | ICD-10-CM

## 2018-12-07 LAB — CBC
HCT: 43.3 % (ref 36.0–46.0)
HEMOGLOBIN: 14.5 g/dL (ref 12.0–15.0)
MCHC: 33.6 g/dL (ref 30.0–36.0)
MCV: 86.9 fl (ref 78.0–100.0)
Platelets: 291 10*3/uL (ref 150.0–400.0)
RBC: 4.98 Mil/uL (ref 3.87–5.11)
RDW: 13.3 % (ref 11.5–15.5)
WBC: 5.3 10*3/uL (ref 4.0–10.5)

## 2018-12-07 LAB — COMPREHENSIVE METABOLIC PANEL
ALT: 26 U/L (ref 0–35)
AST: 19 U/L (ref 0–37)
Albumin: 4.7 g/dL (ref 3.5–5.2)
Alkaline Phosphatase: 122 U/L — ABNORMAL HIGH (ref 39–117)
BILIRUBIN TOTAL: 0.5 mg/dL (ref 0.2–1.2)
BUN: 14 mg/dL (ref 6–23)
CO2: 28 meq/L (ref 19–32)
Calcium: 9.5 mg/dL (ref 8.4–10.5)
Chloride: 104 mEq/L (ref 96–112)
Creatinine, Ser: 0.78 mg/dL (ref 0.40–1.20)
GFR: 74.04 mL/min (ref >60.00)
GLUCOSE: 90 mg/dL (ref 70–99)
Potassium: 5.5 mEq/L — ABNORMAL HIGH (ref 3.5–5.1)
SODIUM: 140 meq/L (ref 135–145)
TOTAL PROTEIN: 6.8 g/dL (ref 6.0–8.3)

## 2018-12-07 LAB — LIPID PANEL
CHOL/HDL RATIO: 3
Cholesterol: 245 mg/dL — ABNORMAL HIGH (ref 0–200)
HDL: 81.1 mg/dL (ref >39.00)
LDL CALC: 144 mg/dL — AB (ref 0–99)
NONHDL: 163.47
TRIGLYCERIDES: 97 mg/dL (ref 0.0–149.0)
VLDL: 19.4 mg/dL (ref 0.0–40.0)

## 2018-12-07 LAB — HEMOGLOBIN A1C: Hgb A1c MFr Bld: 5.4 % (ref 4.6–6.5)

## 2018-12-07 NOTE — Patient Instructions (Signed)
It was certainly a pleasure to meet you today, I will look forward to working with you in the future  [facility] Routine labs today, I will be in touch of these reports ASAP We ordered your bone density scan, you can stop at the ground for imaging department and schedule this at your convenience Or you can call [phone] We can also do your mammogram here in the future if you like  I will always communicate all results with you, either by phone, mail, or email.  If you do not hear from me that means something went wrong, please don't hesitate to reach out in that case

## 2018-12-07 NOTE — Addendum Note (Signed)
Addended by: Abbe Amsterdam C on: 12/07/2018 05:09 PM   Modules accepted: Orders

## 2018-12-08 ENCOUNTER — Ambulatory Visit (HOSPITAL_BASED_OUTPATIENT_CLINIC_OR_DEPARTMENT_OTHER)
Admission: RE | Admit: 2018-12-08 | Discharge: 2018-12-08 | Disposition: A | Discharge status: Home / Self Care | Payer: Medicare HMO | Source: Ambulatory Visit | Attending: Family Medicine | Admitting: Family Medicine

## 2018-12-08 ENCOUNTER — Encounter: Payer: Self-pay | Admitting: Family Medicine

## 2018-12-08 DIAGNOSIS — Z13820 Encounter for screening for osteoporosis: Secondary | ICD-10-CM | POA: Diagnosis present

## 2018-12-08 DIAGNOSIS — E2839 Other primary ovarian failure: Secondary | ICD-10-CM | POA: Insufficient documentation

## 2018-12-12 ENCOUNTER — Encounter: Payer: BLUE CROSS/BLUE SHIELD | Admitting: Family Medicine

## 2018-12-12 ENCOUNTER — Ambulatory Visit (INDEPENDENT_AMBULATORY_CARE_PROVIDER_SITE_OTHER): Payer: Medicare HMO

## 2018-12-12 DIAGNOSIS — J309 Allergic rhinitis, unspecified: Secondary | ICD-10-CM

## 2018-12-13 ENCOUNTER — Other Ambulatory Visit: Payer: Self-pay | Admitting: Pediatrics

## 2018-12-13 NOTE — Telephone Encounter (Signed)
Is it ok to send in rx?

## 2018-12-13 NOTE — Telephone Encounter (Signed)
PT called in to see if Dr Beaulah Dinning can re-order azelastine nasal spray prescribed by Dr Margarita Rana 02/08/2017. Last refill in 12/2017

## 2018-12-13 NOTE — Telephone Encounter (Signed)
Okay to refill azelastine 0.1% - 2 sprays per nostril twice a day if needed for runny nose

## 2018-12-14 ENCOUNTER — Other Ambulatory Visit: Payer: Self-pay | Admitting: Family Medicine

## 2018-12-14 MED ORDER — AZELASTINE HCL 0.1 % NA SOLN: NASAL | 1 refills | Status: DC

## 2018-12-14 NOTE — Telephone Encounter (Signed)
Sent in Rx for azelastine 0.1%.  Patient made OV with Thermon Leyland for 12/19/18 for follow up.

## 2018-12-19 ENCOUNTER — Ambulatory Visit: Payer: Medicare HMO | Admitting: Family Medicine

## 2018-12-19 ENCOUNTER — Encounter: Payer: Self-pay | Admitting: Family Medicine

## 2018-12-19 ENCOUNTER — Ambulatory Visit: Payer: Self-pay

## 2018-12-19 VITALS — BP 140/78 | HR 70 | Temp 98.3°F | Resp 16 | Ht 67.5 in | Wt 196.2 lb

## 2018-12-19 DIAGNOSIS — J309 Allergic rhinitis, unspecified: Secondary | ICD-10-CM

## 2018-12-19 DIAGNOSIS — H1010 Acute atopic conjunctivitis, unspecified eye: Secondary | ICD-10-CM

## 2018-12-19 DIAGNOSIS — K219 Gastro-esophageal reflux disease without esophagitis: Secondary | ICD-10-CM | POA: Diagnosis not present

## 2018-12-19 DIAGNOSIS — J4530 Mild persistent asthma, uncomplicated: Secondary | ICD-10-CM

## 2018-12-19 DIAGNOSIS — T7800XD Anaphylactic reaction due to unspecified food, subsequent encounter: Secondary | ICD-10-CM

## 2018-12-19 MED ORDER — MONTELUKAST SODIUM 10 MG PO TABS: 10.0000 mg | ORAL_TABLET | Freq: Every day | ORAL | 3 refills | Status: DC

## 2018-12-19 MED ORDER — OMEPRAZOLE 20 MG PO CPDR: 20.0000 mg | DELAYED_RELEASE_CAPSULE | Freq: Every day | ORAL | 5 refills | Status: DC

## 2018-12-19 MED ORDER — LORATADINE 10 MG PO TABS: 10.0000 mg | ORAL_TABLET | Freq: Every day | ORAL | 5 refills | Status: DC

## 2018-12-19 MED ORDER — AZELASTINE HCL 0.1 % NA SOLN: NASAL | 1 refills | Status: DC

## 2018-12-19 NOTE — Patient Instructions (Addendum)
Claritin 10 mg once a day for runny nose and itchy eyes Fluticasone 2 sprays per nostril once a day  for stuffy nose Continue azelastine 1-2 sprays in each nostril twice a day as needed for a runny nose Opcon A one  drop 3 times a day if needed for itchy eyes Montelukast 10 mg once a day to prevent coughing or wheezing ProAir 2 puffs every 4 hours if needed for wheezing or coughing spells Continue omeprazole 20 mg once a day for reflux Continue on your other medications  Continue allergen immunotherapy once a week Do not let the dog sleep in your bed.   Avoid shellfish.  If you have an allergic reaction take Benadryl 50 mg every 4 hours, and  if you have life-threatening symptoms inject with AUVI-Q  0.3 mg  Follow up in 6 months or sooner if needed

## 2018-12-19 NOTE — Progress Notes (Signed)
[address] [place] [address] Dept: [phone]  FOLLOW UP NOTE  Patient ID: Ashley Craig, female    DOB: [birth year]  Age: 68 y.o. MRN: [id number] Date of Office Visit: 12/19/2018  Assessment  Chief Complaint: Follow-up  HPI [name] is a 68 year old female who presents to the clinic for a follow up visit. She reports allergic rhinitis as not well controlled with a clear runny nose, thick post nasal drainage, and cough with clear mucus production. She reports allergic conjunctivitis as not well controlled with red and itchy eyes. The symptoms have worsened since she has recently adopted a dog who sleeps in her room in a crate. She continues allergen immunotherapy with a history of large local reactions which occur the day following the injection. She last received 0.25 of weed/dog and 0.25 of tree/grass today from the yellow vial (1:10,000) on schedule A. Asthma is reported as well controlled with wheezing noted only while she has a sinus infection. She continues montelukast 10 mg once a day and rarely needs her albuterol inhaler. She reports her reflux has been moderately well controlled with omeprazole as needed. She continues to avoid shellfish and carry her AuviQ at all times. Her medications are listed in the chart.    Drug Allergies:  Allergies  Allergen Reactions  . Other     Shellfish-Anaphylaxis  . Codeine     nausea    Physical Exam: BP 140/78 (BP Location: Right Arm, Patient Position: Sitting, Cuff Size: Normal)   Pulse 70   Temp 98.3 F (36.8 C) (Oral)   Resp 16   Ht 5' 7.5" (1.715 m)   Wt 196 lb 3.2 oz (89 kg)   SpO2 96%   BMI 30.28 kg/m    Physical Exam Vitals signs reviewed.  Constitutional:      Appearance: Normal appearance.  HENT:     Head: Normocephalic and atraumatic.     Nose:     Comments: Bilateral nares slightly erythematous with clear nasal drainage noted. Pharynx slightly erythematous with no exudate. Ears normal. Eyes  normal. Eyes:     Conjunctiva/sclera: Conjunctivae normal.  Neck:     Musculoskeletal: Normal range of motion and neck supple.  Cardiovascular:     Rate and Rhythm: Normal rate and regular rhythm.     Heart sounds: Normal heart sounds. No murmur.  Pulmonary:     Effort: Pulmonary effort is normal.     Breath sounds: Normal breath sounds.     Comments: Lungs clear to auscultation Musculoskeletal: Normal range of motion.  Skin:    General: Skin is warm and dry.  Neurological:     Mental Status: She is alert and oriented to person, place, and time.  Psychiatric:        Mood and Affect: Mood normal.        Behavior: Behavior normal.        Thought Content: Thought content normal.        Judgment: Judgment normal.     Diagnostics: FVC 2.93, FEV1 2.44. Predicted FVC 3.68, predicted FEV1 2.82. Spirometry is within the normal range.   Assessment and Plan: 1. Allergic rhinitis, unspecified seasonality, unspecified trigger   2. Seasonal allergic conjunctivitis   3. Mild persistent asthma without complication   4. Gastroesophageal reflux disease, esophagitis presence not specified   5. Anaphylactic shock due to food, subsequent encounter     Meds ordered this encounter  Medications  . omeprazole (PRILOSEC) 20 MG capsule  Sig: Take 1 capsule (20 mg total) by mouth daily.    Dispense:  30 capsule    Refill:  5  . montelukast (SINGULAIR) 10 MG tablet    Sig: Take 1 tablet (10 mg total) by mouth at bedtime.    Dispense:  90 tablet    Refill:  3    On hold, pt will call.  . loratadine (CLARITIN) 10 MG tablet    Sig: Take 1 tablet (10 mg total) by mouth daily.    Dispense:  30 tablet    Refill:  5  . azelastine (ASTELIN) 0.1 % nasal spray    Sig: One spray each nostril twice a day if needed    Dispense:  30 mL    Refill:  1    Patient Instructions  Claritin 10 mg once a day for runny nose and itchy eyes Fluticasone 2 sprays per nostril once a day  for stuffy nose Continue  azelastine 1-2 sprays in each nostril twice a day as needed for a runny nose Opcon A one  drop 3 times a day if needed for itchy eyes Montelukast 10 mg once a day to prevent coughing or wheezing ProAir 2 puffs every 4 hours if needed for wheezing or coughing spells Continue omeprazole 20 mg once a day for reflux Continue on your other medications  Continue allergen immunotherapy once a week Do not let the dog sleep in your bed.   Avoid shellfish.  If you have an allergic reaction take Benadryl 50 mg every 4 hours, and  if you have life-threatening symptoms inject with AUVI-Q  0.3 mg  Follow up in 6 months or sooner if needed   Return in about 6 months (around 06/19/2019), or if symptoms worsen or fail to improve.   Thank you for the opportunity to care for this patient.  Please do not hesitate to contact me with questions.  Thermon Leyland, FNP [facility]  I have provided oversight concerning Thermon Leyland' evaluation and treatment of this patient's health issues addressed during today's encounter. I agree with the assessment and therapeutic plan as outlined in the note.   Thank you for the opportunity to care for this patient.  Please do not hesitate to contact me with questions.  Tonette Bihari, M.D.  [facility] [address] [place], [place] [address] [phone]

## 2018-12-20 ENCOUNTER — Ambulatory Visit: Payer: Medicare HMO | Admitting: Family Medicine

## 2018-12-20 ENCOUNTER — Encounter: Payer: Self-pay | Admitting: Family Medicine

## 2018-12-20 VITALS — BP 142/82 | HR 75 | Resp 16

## 2018-12-20 DIAGNOSIS — J3081 Allergic rhinitis due to animal (cat) (dog) hair and dander: Secondary | ICD-10-CM

## 2018-12-20 DIAGNOSIS — J4530 Mild persistent asthma, uncomplicated: Secondary | ICD-10-CM

## 2018-12-20 DIAGNOSIS — T7800XD Anaphylactic reaction due to unspecified food, subsequent encounter: Secondary | ICD-10-CM

## 2018-12-20 DIAGNOSIS — K219 Gastro-esophageal reflux disease without esophagitis: Secondary | ICD-10-CM

## 2018-12-20 NOTE — Addendum Note (Signed)
Addended by: Clydia Llano on: 12/20/2018 02:03 PM   Modules accepted: Orders

## 2018-12-20 NOTE — Addendum Note (Signed)
Addended by: Clydia Llano on: 12/20/2018 02:10 PM   Modules accepted: Orders

## 2018-12-20 NOTE — Progress Notes (Signed)
[address] [place] [address] Dept: [phone]  FOLLOW UP NOTE  Patient ID: Ashley Craig, female    DOB: October 14, 1951  Age: 68 y.o. MRN: [id number] Date of Office Visit: 12/20/2018  Assessment  Chief Complaint: No chief complaint on file.  HPI Ashley Craig is a 68 year old female who presents to the clinic for a delayed local allergen immunotherapy reaction. Yesterday she received grass and tree 0.25 mL gold vial (1:10,000) injection in her right arm. Today she is experiencing redness and warmth that is the size of a softball. She reports that she is having some trouble breathing with shortness of breath while talking that began this morning. She is currently taking loratadine 10 mg once a day, montelukast 10 mg once a day, and albuterol as needed. She has had several large local reactions to her allergen immunotherapy that began on 05/11/2018. Her current medications are listed in the chart.   Drug Allergies:  Allergies  Allergen Reactions  . Other     Shellfish-Anaphylaxis  . Codeine     nausea    Physical Exam: BP (!) 142/82   Pulse 75   Resp 16   SpO2 98%    Physical Exam Vitals signs reviewed.  HENT:     Head: Normocephalic and atraumatic.     Nose:     Comments: Bilateral nares slightly erythematous with clear nasal drainage noted. Pharynx slightly erythematous with no exudate noted. Ears normal. Eyes normal. Eyes:     Conjunctiva/sclera: Conjunctivae normal.  Neck:     Musculoskeletal: Normal range of motion and neck supple.  Cardiovascular:     Rate and Rhythm: Normal rate and regular rhythm.     Heart sounds: Normal heart sounds. No murmur.  Pulmonary:     Effort: Pulmonary effort is normal.     Breath sounds: Normal breath sounds.     Comments: Lungs clear to auscultation Musculoskeletal: Normal range of motion.  Skin:    General: Skin is warm and dry.  Neurological:     Mental Status: She is alert and oriented to person, place, and time.    Psychiatric:        Mood and Affect: Mood normal.        Behavior: Behavior normal.        Thought Content: Thought content normal.        Judgment: Judgment normal.     Diagnostics: FVC 2.84, FEV1 2.44. Predicted FVC 3.53, predicted FEV1 2.70. Spirometry indicates normal ventilatory function.  Assessment and Plan: 1. Allergic rhinitis due to animal hair and dander   2. Mild persistent asthma without complication   3. Gastroesophageal reflux disease, esophagitis presence not specified     No orders of the defined types were placed in this encounter.   Patient Instructions  Continue allergen immunotherapy once a week. We will stop grass, tree, and weed for now and continue with dog only. Prednisone 10 mg tablets. Take 2 tablets once a day while your arm is swelling. When the swelling decreases, stop taking prednisone.  Claritin 10 mg once a day for runny nose and itchy eyes Fluticasone 2 sprays per nostril once a day  for stuffy nose Continue azelastine 1-2 sprays in each nostril twice a day for a runny nose Opcon A one  drop 3 times a day if needed for itchy eyes Montelukast 10 mg once a day to prevent coughing or wheezing ProAir 2 puffs every 4 hours if needed for wheezing or  coughing spells Continue omeprazole 20 mg once a day for reflux Continue on your other medications    Do not let the dog sleep in your bed.   Avoid shellfish.  If you have an allergic reaction take Benadryl 50 mg every 4 hours, and  if you have life-threatening symptoms inject with AUVI-Q  0.3 mg  Follow up in October 2020 or sooner if needed   Return in about 8 months (around 08/20/2019), or if symptoms worsen or fail to improve.   Thank you for the opportunity to care for this patient.  Please do not hesitate to contact me with questions.  Thermon Leyland, FNP [facility]  I have provided oversight concerning Thermon Leyland' evaluation and  treatment of this patient's health issues addressed during today's encounter. I agree with the assessment and therapeutic plan as outlined in the note.   Thank you for the opportunity to care for this patient.  Please do not hesitate to contact me with questions.  Tonette Bihari, M.D.  [facility] [address] [place], [place] [address] [phone]

## 2018-12-20 NOTE — Patient Instructions (Addendum)
Continue allergen immunotherapy once a week. We will stop grass, tree, and weed for now and continue with dog only. Prednisone 10 mg tablets. Take 2 tablets once a day while your arm is swelling. When the swelling decreases, stop taking prednisone.  Claritin 10 mg once a day for runny nose and itchy eyes Fluticasone 2 sprays per nostril once a day  for stuffy nose Continue azelastine 1-2 sprays in each nostril twice a day for a runny nose Opcon A one  drop 3 times a day if needed for itchy eyes Montelukast 10 mg once a day to prevent coughing or wheezing ProAir 2 puffs every 4 hours if needed for wheezing or coughing spells Continue omeprazole 20 mg once a day for reflux Continue on your other medications    Do not let the dog sleep in your bed.   Avoid shellfish.  If you have an allergic reaction take Benadryl 50 mg every 4 hours, and  if you have life-threatening symptoms inject with AUVI-Q  0.3 mg  Follow up in October 2020 or sooner if needed

## 2018-12-20 NOTE — Progress Notes (Signed)
VIALS EXP 11-20-2019.  DC OLD RX.

## 2018-12-21 ENCOUNTER — Other Ambulatory Visit (INDEPENDENT_AMBULATORY_CARE_PROVIDER_SITE_OTHER): Payer: Medicare HMO

## 2018-12-21 ENCOUNTER — Encounter: Payer: Self-pay | Admitting: Family Medicine

## 2018-12-21 DIAGNOSIS — E875 Hyperkalemia: Secondary | ICD-10-CM

## 2018-12-21 LAB — POTASSIUM: POTASSIUM: 4.4 meq/L (ref 3.5–5.1)

## 2018-12-22 DIAGNOSIS — J3081 Allergic rhinitis due to animal (cat) (dog) hair and dander: Secondary | ICD-10-CM | POA: Diagnosis not present

## 2018-12-26 ENCOUNTER — Ambulatory Visit (INDEPENDENT_AMBULATORY_CARE_PROVIDER_SITE_OTHER): Payer: Medicare HMO

## 2018-12-26 DIAGNOSIS — J309 Allergic rhinitis, unspecified: Secondary | ICD-10-CM | POA: Diagnosis not present

## 2019-01-02 ENCOUNTER — Ambulatory Visit (INDEPENDENT_AMBULATORY_CARE_PROVIDER_SITE_OTHER): Payer: Medicare HMO | Admitting: *Deleted

## 2019-01-02 DIAGNOSIS — J309 Allergic rhinitis, unspecified: Secondary | ICD-10-CM | POA: Diagnosis not present

## 2019-01-09 ENCOUNTER — Ambulatory Visit (INDEPENDENT_AMBULATORY_CARE_PROVIDER_SITE_OTHER): Payer: Medicare HMO

## 2019-01-09 DIAGNOSIS — J309 Allergic rhinitis, unspecified: Secondary | ICD-10-CM | POA: Diagnosis not present

## 2019-01-16 ENCOUNTER — Other Ambulatory Visit: Payer: Self-pay | Admitting: Family Medicine

## 2019-01-16 NOTE — Telephone Encounter (Signed)
Requested medication (s) are due for refill today: not specified  Requested medication (s) are on the active medication list: yes    Last refill: 01/05/2017  Future visit scheduled yes 06/07/2019  Dr. Patsy Lager  Notes to clinic:historical provider  Requested Prescriptions  Pending Prescriptions Disp Refills   perindopril (ACEON) 4 MG tablet      Sig: Take 1 tablet (4 mg total) by mouth.     Cardiovascular:  ACE Inhibitors Failed - 01/16/2019  3:04 PM      Failed - Last BP in normal range    BP Readings from Last 1 Encounters:  12/20/18 (!) 142/82         Passed - Cr in normal range and within 180 days    Creatinine, Ser  Date Value Ref Range Status  12/07/2018 0.78 0.40 - 1.20 mg/dL Final         Passed - K in normal range and within 180 days    Potassium  Date Value Ref Range Status  12/21/2018 4.4 3.5 - 5.1 mEq/L Final         Passed - Patient is not pregnant      Passed - Valid encounter within last 6 months    Recent Outpatient Visits          1 month ago Estrogen deficiency   Holiday representative at [facility] [employer], Gwenlyn Found, MD      Future Appointments            In 4 months Copland, Gwenlyn Found, MD [facility], [facility]

## 2019-01-16 NOTE — Telephone Encounter (Signed)
Copied from CRM [id number]. Topic: Quick Communication - Rx Refill/Question >> Jan 16, 2019 11:53 AM Fanny Bien wrote: Medication: perindopril (ACEON) 4 MG tablet [[id number]]  Has the patient contacted their pharmacy?  Preferred Pharmacy (with phone number or street name): [id number], [place] - [address] [phone] (Phone) [phone] (Fax)   Agent: Please be advised that RX refills may take up to 3 business days. We ask that you follow-up with your pharmacy.

## 2019-01-17 MED ORDER — PERINDOPRIL ERBUMINE 4 MG PO TABS: 4.0000 mg | ORAL_TABLET | Freq: Every day | ORAL | 3 refills | Status: DC

## 2019-01-19 ENCOUNTER — Encounter: Payer: Self-pay | Admitting: Family Medicine

## 2019-01-19 MED ORDER — PERINDOPRIL ERBUMINE 2 MG PO TABS: 2.0000 mg | ORAL_TABLET | Freq: Every day | ORAL | 3 refills | Status: DC

## 2019-01-31 ENCOUNTER — Telehealth: Payer: Self-pay

## 2019-01-31 NOTE — Telephone Encounter (Signed)
Copied from CRM [id number]. Topic: Quick Communication - See Telephone Encounter >> Jan 27, 2019  1:43 PM Fanny Bien wrote: CRM for notification. See Telephone encounter for: 01/27/19. Pt called and left a VM that stated that she has a sore in her nose that will not go away. Pt would like a call back about scheduling. Please advise

## 2019-01-31 NOTE — Telephone Encounter (Signed)
Called patient to scheduled Webex. But she has neopsorin and antibiotic ointment and it has helped her greatly and she does not feel webex appt is needed at this time but she will call us if she feels it is necessary.

## 2019-02-08 ENCOUNTER — Encounter: Payer: Self-pay | Admitting: Family Medicine

## 2019-03-15 ENCOUNTER — Encounter: Payer: Self-pay | Admitting: Family Medicine

## 2019-03-15 ENCOUNTER — Other Ambulatory Visit: Payer: Self-pay

## 2019-03-15 ENCOUNTER — Ambulatory Visit (INDEPENDENT_AMBULATORY_CARE_PROVIDER_SITE_OTHER): Payer: Medicare HMO | Admitting: Family Medicine

## 2019-03-15 DIAGNOSIS — J0111 Acute recurrent frontal sinusitis: Secondary | ICD-10-CM

## 2019-03-15 MED ORDER — AMOXICILLIN 500 MG PO CAPS: 1000.0000 mg | ORAL_CAPSULE | Freq: Two times a day (BID) | ORAL | 0 refills | Status: DC

## 2019-03-15 NOTE — Progress Notes (Signed)
[facility] at [facility] [address] [place], [place] [address] [phone]  Date:  03/15/2019   Name:  Ashley Craig   DOB:  June 06, 1951   MRN:  [id number]  PCP:  Pearline Cables, MD    Chief Complaint: No chief complaint on file.   History of Present Illness:  Ashley Craig is a 68 y.o. very pleasant female patient who presents with the following:  Virtual visit today due to pandemic Pt location is home Provider location is office Pt ID confirmed with name and DOB. She gives consent for virtual visit today   She is sick today with likely sinusitis - sx for about a week now She notes that she tends to get a sinus infection about this time every year She notes nasal drainage, her nose feels raw. Her throat is raw she thinks due to PND She notes sinus pressure and headache Sniffling and sneezing Mild cough, mostly due to drainage No GI symptoms No rash   Patient Active Problem List   Diagnosis Date Noted  . Anaphylactic shock due to adverse food reaction 05/11/2018  . Seasonal allergic conjunctivitis 05/09/2018  . Impingement syndrome of left ankle 07/12/2017  . Diarrhea 05/21/2017  . Gastroesophageal reflux disease with esophagitis 05/21/2017  . History of colon polyps 05/21/2017  . Orthostatic hypotension 04/08/2017  . Chronic pain of left ankle 03/30/2017  . Unilateral primary osteoarthritis, left knee 11/13/2016  . Allergic dermatitis 08/12/2016  . Chronic dryness of both eyes 06/02/2016  . Hypermetropia of both eyes 06/02/2016  . Open angle with borderline findings and low glaucoma risk in both eyes 06/02/2016  . Pinguecula of both eyes 06/02/2016  . Presbyopia of both eyes 06/02/2016  . Regular astigmatism of both eyes 06/02/2016  . Cellulitis of right lower extremity 01/30/2016  . Varicose veins of left lower extremity with pain 01/16/2016  . Mild persistent asthma without complication 11/05/2015  .  Chronic venous insufficiency 08/07/2015  . Spontaneous hematoma of lower leg 11/30/2014  . Spinal stenosis of lumbar region 10/11/2014  . Allergic rhinitis 04/27/2014  . Adjustment disorder 04/27/2014  . Benign essential hypertension 04/27/2014  . Benign paroxysmal vertigo 04/27/2014  . Chondrocostal junction syndrome 04/27/2014  . Elevation of level of transaminase or lactic acid dehydrogenase (LDH) 04/27/2014  . 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 Overweight 04/27/2014  . Spontaneous ecchymosis 04/27/2014  . Tendonitis of shoulder 04/27/2014  . Gastroesophageal reflux disease 03/27/2014  . Anxiety 03/27/2014  . Environmental allergies 03/27/2014  . Hypercholesteremia 03/27/2014  . Insomnia 03/27/2014  . Vitamin D deficiency 03/27/2014    Past Medical History:  Diagnosis Date  . Anxiety    takes Zoloft daily;takes Clonazepam daily as needed  . Arthritis   . Chronic back pain    stenosis  . Diarrhea    occasionally  . GERD (gastroesophageal reflux disease)    takes Omeprazole daily   . History of blood clots 10 yrs ago   left calf after a bone break  . History of bronchitis 2014  . History of colon polyps    benign  . Hyperlipidemia    takes Simvastatin daily  . Hypertension   . Insomnia    takes Melatonin nightly  . Joint pain   . Seasonal allergies    takes Claritin daily;Nasonex and ProAir as needed;Singulair daily  . Weakness    numbness and tingling in right leg    Past Surgical  History:  Procedure Laterality Date  . ANAL FISSURE REPAIR    . ANKLE SURGERY Left 09/2017  . colonosocpy    . D&C of bladder  56yrs ago  . ESOPHAGOGASTRODUODENOSCOPY     with ED  . TONSILLECTOMY    . WRIST SURGERY Right    with plate    Social History   Tobacco Use  . Smoking status: Never Smoker  . Smokeless tobacco: Never Used  Substance Use Topics  . Alcohol use: Yes    Comment: wine daily  . Drug use: No    Family History  Problem Relation Age of Onset  .  Allergic rhinitis Neg Hx   . Angioedema Neg Hx   . Asthma Neg Hx   . Eczema Neg Hx   . Immunodeficiency Neg Hx   . Urticaria Neg Hx     Allergies  Allergen Reactions  . Other     Shellfish-Anaphylaxis  . Codeine     nausea    Medication list has been reviewed and updated.  Current Outpatient Medications on File Prior to Visit  Medication Sig Dispense Refill  . albuterol (PROVENTIL HFA;VENTOLIN HFA) 108 (90 Base) MCG/ACT inhaler Inhale 2 puffs into the lungs every 4 (four) hours as needed for wheezing or shortness of breath. 1 Inhaler 0  . azelastine (ASTELIN) 0.1 % nasal spray One spray each nostril twice a day if needed 30 mL 1  . clonazePAM (KLONOPIN) 0.5 MG tablet Take 0.5 mg by mouth daily as needed for anxiety.    . diclofenac sodium (VOLTAREN) 1 % GEL APPLY TO AFFECTED AREA 1-3 TIMES A DAY FOR 30 DAYS  1  . EPINEPHrine (AUVI-Q) 0.3 mg/0.3 mL IJ SOAJ injection Use as directed for severe allergic reactions 2 Device 1  . fluticasone (FLONASE) 50 MCG/ACT nasal spray DISPENSE 2 SPRAYS INTO EACH NOSTRIL ONCE DAILY FOR DRAINAGE 16 g 5  . ibuprofen (ADVIL,MOTRIN) 200 MG tablet Take 400 mg by mouth 3 (three) times daily as needed. Reported on 11/05/2015    . lamoTRIgine (LAMICTAL) 200 MG tablet Take 200 mg by mouth daily.  0  . loratadine (CLARITIN) 10 MG tablet Take 1 tablet (10 mg total) by mouth daily. 30 tablet 5  . Melatonin 3 MG TABS Take 3 mg by mouth at bedtime. Reported on 11/05/2015    . montelukast (SINGULAIR) 10 MG tablet Take 1 tablet (10 mg total) by mouth at bedtime. 90 tablet 3  . NON FORMULARY     . olopatadine (PATANOL) 0.1 % ophthalmic solution Place 1 drop into both eyes 2 (two) times daily. 5 mL 1  . omeprazole (PRILOSEC) 20 MG capsule Take 20 mg by mouth daily.    Marland Kitchen omeprazole (PRILOSEC) 20 MG capsule Take 1 capsule (20 mg total) by mouth daily. 30 capsule 5  . perindopril (ACEON) 2 MG tablet Take 1 tablet (2 mg total) by mouth daily. 90 tablet 3  . sertraline  (ZOLOFT) 100 MG tablet TAKE 2 TABLETS BY MOUTH EVERY DAY    . simvastatin (ZOCOR) 20 MG tablet Take one tablet daily **last refill needs to be seen**    . temazepam (RESTORIL) 30 MG capsule Take 30 mg by mouth at bedtime as needed for sleep. Reported on 11/05/2015     No current facility-administered medications on file prior to visit.     Review of Systems:  As per HPI- otherwise negative. No vomiting or diarrhea   Physical Examination: There were no vitals filed for this visit.  There were no vitals filed for this visit. There is no height or weight on file to calculate BMI. Ideal Body Weight:    Her temp is normal- 98.3 She is not checking her BP  Pt observed over video- she looks well, no cough, distress noted  Assessment and Plan: Acute recurrent frontal sinusitis - Plan: amoxicillin (AMOXIL) 500 MG capsule  Treat for acute sinusitis with amoxicillin for 10 days She is asked to alert me if not feeling better in the next few days- Sooner if worse.  Suggested plain mucinex as an OTC expectorant  Meds ordered this encounter  Medications  . amoxicillin (AMOXIL) 500 MG capsule    Sig: Take 2 capsules (1,000 mg total) by mouth 2 (two) times daily.    Dispense:  40 capsule    Refill:  0    Confirmed that she does NOT use lamictal for seizures    Signed Abbe AmsterdamJessica Copland, MD

## 2019-03-20 ENCOUNTER — Ambulatory Visit (INDEPENDENT_AMBULATORY_CARE_PROVIDER_SITE_OTHER): Payer: Medicare HMO

## 2019-03-20 DIAGNOSIS — J309 Allergic rhinitis, unspecified: Secondary | ICD-10-CM

## 2019-03-26 ENCOUNTER — Encounter: Payer: Self-pay | Admitting: Family Medicine

## 2019-04-03 ENCOUNTER — Ambulatory Visit (INDEPENDENT_AMBULATORY_CARE_PROVIDER_SITE_OTHER): Payer: Medicare HMO

## 2019-04-03 DIAGNOSIS — J309 Allergic rhinitis, unspecified: Secondary | ICD-10-CM

## 2019-04-10 ENCOUNTER — Ambulatory Visit (INDEPENDENT_AMBULATORY_CARE_PROVIDER_SITE_OTHER): Payer: Medicare HMO

## 2019-04-10 DIAGNOSIS — J309 Allergic rhinitis, unspecified: Secondary | ICD-10-CM | POA: Diagnosis not present

## 2019-04-17 ENCOUNTER — Ambulatory Visit (INDEPENDENT_AMBULATORY_CARE_PROVIDER_SITE_OTHER): Payer: Medicare HMO

## 2019-04-17 DIAGNOSIS — J309 Allergic rhinitis, unspecified: Secondary | ICD-10-CM

## 2019-04-24 ENCOUNTER — Ambulatory Visit (INDEPENDENT_AMBULATORY_CARE_PROVIDER_SITE_OTHER): Payer: Medicare HMO

## 2019-04-24 DIAGNOSIS — J309 Allergic rhinitis, unspecified: Secondary | ICD-10-CM | POA: Diagnosis not present

## 2019-05-01 ENCOUNTER — Ambulatory Visit (INDEPENDENT_AMBULATORY_CARE_PROVIDER_SITE_OTHER): Payer: Medicare HMO

## 2019-05-01 DIAGNOSIS — J309 Allergic rhinitis, unspecified: Secondary | ICD-10-CM | POA: Diagnosis not present

## 2019-05-15 ENCOUNTER — Encounter: Payer: Self-pay | Admitting: Family Medicine

## 2019-05-15 MED ORDER — SIMVASTATIN 20 MG PO TABS: 20.0000 mg | ORAL_TABLET | Freq: Every day | ORAL | 0 refills | Status: DC

## 2019-05-16 ENCOUNTER — Ambulatory Visit (INDEPENDENT_AMBULATORY_CARE_PROVIDER_SITE_OTHER): Payer: Medicare HMO

## 2019-05-16 DIAGNOSIS — J309 Allergic rhinitis, unspecified: Secondary | ICD-10-CM | POA: Diagnosis not present

## 2019-05-23 ENCOUNTER — Ambulatory Visit (INDEPENDENT_AMBULATORY_CARE_PROVIDER_SITE_OTHER): Payer: Medicare HMO

## 2019-05-23 DIAGNOSIS — J309 Allergic rhinitis, unspecified: Secondary | ICD-10-CM | POA: Diagnosis not present

## 2019-05-29 ENCOUNTER — Other Ambulatory Visit: Payer: Self-pay | Admitting: Family Medicine

## 2019-05-29 DIAGNOSIS — Z1231 Encounter for screening mammogram for malignant neoplasm of breast: Secondary | ICD-10-CM

## 2019-05-31 ENCOUNTER — Ambulatory Visit (INDEPENDENT_AMBULATORY_CARE_PROVIDER_SITE_OTHER): Payer: Medicare HMO

## 2019-05-31 DIAGNOSIS — J309 Allergic rhinitis, unspecified: Secondary | ICD-10-CM

## 2019-06-05 ENCOUNTER — Ambulatory Visit (INDEPENDENT_AMBULATORY_CARE_PROVIDER_SITE_OTHER): Payer: Medicare HMO

## 2019-06-05 DIAGNOSIS — J309 Allergic rhinitis, unspecified: Secondary | ICD-10-CM | POA: Diagnosis not present

## 2019-06-06 NOTE — Progress Notes (Addendum)
[facility] at [facility], [place], [place] [address] [phone]  Date:  06/07/2019   Name:  Ashley Craig   DOB:  [birth year]   MRN:  [id number]  PCP:  Darreld Mclean, MD    Chief Complaint: Hyperlipidemia (6 month follow up) and Hypertension   History of Present Illness:  Ashley Craig is a 68 y.o. very pleasant female patient who presents with the following:  Here today for 60-monthfollow-up visit History of mild asthma, hypertension, GERD, hyperlipidemia, corneal issues, chronic back pain She is receiving immunotherapy shots for allergy and asthma We did a virtual visit together in May for sinus infection, but she is due for 643-monthabs Alk phos was minimally elevated in February, also needs hepatitis C screening  Per visit in February: She has an identical twin sister and older brother who had melanoma She is divorced, has 2 grands by marriage.  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   Her dad did pass away  She now does a lot of volunteer work thorough her church and also prSoftware engineerf the board of directors for [employer] is also a member of the daughters of the [employer] She notes that her health has been generally good, except for allergies and sinus infections She was re-tested recently and her allergies changed, they are doing immunotherapy weekly again.  This does help   Dr. RaAntionette Fairys her optho- per pt she has borderline pressure but is not on drops for this  Dr. BaNickie Retorts her allergist  She did have a lower lumber fusion per [place] about 5 years ago  Her general ortho is Dr. DuSharol Givenhe does not see GYN any longer  Her psychiatric care provider is LeEulogio DitchNP  She is on zocor for her lipids and perindopril for her BP  Her  volunteer work is on hold due to the pandemic- she is missing this work  She had to stop allergy shots due to a reaction, but is still getting treatment for dog allergy Some of her other allergies have resolved Her back is doing fine Eyes are stable- saw optho recently; no significant change  Will do a pap for her today She had dysplasia decades ago- all ok since  Patient Active Problem List   Diagnosis Date Noted  . Anaphylactic shock due to adverse food reaction 05/11/2018  . Seasonal allergic conjunctivitis 05/09/2018  . Impingement syndrome of left ankle 07/12/2017  . Diarrhea 05/21/2017  . Gastroesophageal reflux disease with esophagitis 05/21/2017  . History of colon polyps 05/21/2017  . Orthostatic hypotension 04/08/2017  . Chronic pain of left ankle 03/30/2017  . Unilateral primary osteoarthritis, left knee 11/13/2016  . Allergic dermatitis 08/12/2016  . Chronic dryness of both eyes 06/02/2016  . Hypermetropia of both eyes 06/02/2016  . Open angle with borderline findings and low glaucoma risk in both eyes 06/02/2016  . Pinguecula of both eyes 06/02/2016  . Presbyopia of both eyes 06/02/2016  . Regular astigmatism of both eyes 06/02/2016  . Cellulitis of right lower extremity 01/30/2016  . Varicose veins of left lower extremity with pain 01/16/2016  . Mild persistent asthma without complication 0124/26/8341. Chronic venous insufficiency 08/07/2015  . Spontaneous hematoma of lower leg 11/30/2014  .  Spinal stenosis of lumbar region 10/11/2014  . Allergic rhinitis 04/27/2014  . Adjustment disorder 04/27/2014  . Benign essential hypertension 04/27/2014  . Benign paroxysmal vertigo 04/27/2014  . Chondrocostal junction syndrome 04/27/2014  . Elevation of level of transaminase or lactic acid dehydrogenase (LDH) 04/27/2014  . 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 Overweight 04/27/2014  . Spontaneous ecchymosis 04/27/2014  . Tendonitis of shoulder 04/27/2014  .  Gastroesophageal reflux disease 03/27/2014  . Anxiety 03/27/2014  . Environmental allergies 03/27/2014  . Hypercholesteremia 03/27/2014  . Insomnia 03/27/2014  . Vitamin D deficiency 03/27/2014    Past Medical History:  Diagnosis Date  . Anxiety    takes Zoloft daily;takes Clonazepam daily as needed  . Arthritis   . Chronic back pain    stenosis  . Diarrhea    occasionally  . GERD (gastroesophageal reflux disease)    takes Omeprazole daily   . History of blood clots 10 yrs ago   left calf after a bone break  . History of bronchitis 2014  . History of colon polyps    benign  . Hyperlipidemia    takes Simvastatin daily  . Hypertension   . Insomnia    takes Melatonin nightly  . Joint pain   . Seasonal allergies    takes Claritin daily;Nasonex and ProAir as needed;Singulair daily  . Weakness    numbness and tingling in right leg    Past Surgical History:  Procedure Laterality Date  . ANAL FISSURE REPAIR    . ANKLE SURGERY Left 09/2017  . colonosocpy    . D&C of bladder  63yr ago  . ESOPHAGOGASTRODUODENOSCOPY     with ED  . TONSILLECTOMY    . WRIST SURGERY Right    with plate    Social History   Tobacco Use  . Smoking status: Never Smoker  . Smokeless tobacco: Never Used  Substance Use Topics  . Alcohol use: Yes    Comment: wine daily  . Drug use: No    Family History  Problem Relation Age of Onset  . Allergic rhinitis Neg Hx   . Angioedema Neg Hx   . Asthma Neg Hx   . Eczema Neg Hx   . Immunodeficiency Neg Hx   . Urticaria Neg Hx     Allergies  Allergen Reactions  . Other     Shellfish-Anaphylaxis  . Codeine     nausea  . Dog Epithelium     Medication list has been reviewed and updated.  Current Outpatient Medications on File Prior to Visit  Medication Sig Dispense Refill  . albuterol (PROVENTIL HFA;VENTOLIN HFA) 108 (90 Base) MCG/ACT inhaler Inhale 2 puffs into the lungs every 4 (four) hours as needed for wheezing or shortness of  breath. 1 Inhaler 0  . azelastine (ASTELIN) 0.1 % nasal spray One spray each nostril twice a day if needed 30 mL 1  . clonazePAM (KLONOPIN) 0.5 MG tablet Take 0.5 mg by mouth daily as needed for anxiety.    . diclofenac sodium (VOLTAREN) 1 % GEL APPLY TO AFFECTED AREA 1-3 TIMES A DAY FOR 30 DAYS  1  . EPINEPHrine (AUVI-Q) 0.3 mg/0.3 mL IJ SOAJ injection Use as directed for severe allergic reactions 2 Device 1  . fluticasone (FLONASE) 50 MCG/ACT nasal spray DISPENSE 2 SPRAYS INTO EACH NOSTRIL ONCE DAILY FOR DRAINAGE 16 g 5  . ibuprofen (ADVIL,MOTRIN) 200 MG tablet Take 400 mg by mouth 3 (three) times daily as needed. Reported on 11/05/2015    .  lamoTRIgine (LAMICTAL) 200 MG tablet Take 200 mg by mouth daily.  0  . loratadine (CLARITIN) 10 MG tablet Take 1 tablet (10 mg total) by mouth daily. 30 tablet 5  . Melatonin 3 MG TABS Take 3 mg by mouth at bedtime. Reported on 11/05/2015    . montelukast (SINGULAIR) 10 MG tablet Take 1 tablet (10 mg total) by mouth at bedtime. 90 tablet 3  . NON FORMULARY     . olopatadine (PATANOL) 0.1 % ophthalmic solution Place 1 drop into both eyes 2 (two) times daily. 5 mL 1  . omeprazole (PRILOSEC) 20 MG capsule Take 1 capsule (20 mg total) by mouth daily. 30 capsule 5  . perindopril (ACEON) 2 MG tablet Take 1 tablet (2 mg total) by mouth daily. 90 tablet 3  . sertraline (ZOLOFT) 100 MG tablet TAKE 2 TABLETS BY MOUTH EVERY DAY    . simvastatin (ZOCOR) 20 MG tablet Take 1 tablet (20 mg total) by mouth daily. 90 tablet 0  . temazepam (RESTORIL) 30 MG capsule Take 30 mg by mouth at bedtime as needed for sleep. Reported on 11/05/2015     No current facility-administered medications on file prior to visit.     Review of Systems:  As per HPI- otherwise negative.   Physical Examination: Vitals:   06/07/19 1300  BP: 132/80  Pulse: 84  Resp: 16  Temp: 98.1 F (36.7 C)  SpO2: 98%   Vitals:   06/07/19 1300  Weight: 194 lb 8 oz (88.2 kg)  Height: 5' 7.5" (1.715 m)    Body mass index is 30.01 kg/m. Ideal Body Weight: Weight in (lb) to have BMI = 25: 161.7  GEN: WDWN, NAD, Non-toxic, A & O x 3 HEENT: Atraumatic, Normocephalic. Neck supple. No masses, No LAD. Ears and Nose: No external deformity. CV: RRR, No M/G/R. No JVD. No thrill. No extra heart sounds. PULM: CTA B, no wheezes, crackles, rhonchi. No retractions. No resp. distress. No accessory muscle use. ABD: S, NT, ND, +BS. No rebound. No HSM. EXTR: No c/c/e NEURO Normal gait.  PSYCH: Normally interactive. Conversant. Not depressed or anxious appearing.  Calm demeanor.  Breast: normal exam, no masses/ dimpling/ discharge Pelvic: normal, no vaginal lesions or discharge. Uterus normal, no CMT, no adnexal tendereness or masses    Assessment and Plan:   ICD-10-CM   1. Hyperlipidemia, unspecified hyperlipidemia type  E78.5   2. Essential hypertension  I10   3. Screening for cervical cancer  Z12.4 Cytology - PAP  4. Elevated alkaline phosphatase level  R74.8 Comprehensive metabolic panel  5. Encounter for hepatitis C screening test for low risk patient  Z11.59 Hepatitis C antibody     Follow-up: No follow-ups on file.  No orders of the defined types were placed in this encounter.  Orders Placed This Encounter  Procedures  . Comprehensive metabolic panel  . Hepatitis C antibody    _0 @  Pap today- should be her last screening BP ok Continue current meds Labs pending as above Follow-up in 6 months mammo scheduled next month   Signed Lamar Blinks, MD  Received her labs so far 8/6- message to pt  Results for orders placed or performed in visit on 06/07/19  Comprehensive metabolic panel  Result Value Ref Range   Sodium 138 135 - 145 mEq/L   Potassium 4.3 3.5 - 5.1 mEq/L   Chloride 104 96 - 112 mEq/L   CO2 27 19 - 32 mEq/L   Glucose, Bld 99 70 - 99  mg/dL   BUN 20 6 - 23 mg/dL   Creatinine, Ser 0.71 0.40 - 1.20 mg/dL   Total Bilirubin 0.3 0.2 - 1.2 mg/dL   Alkaline  Phosphatase 122 (H) 39 - 117 U/L   AST 16 0 - 37 U/L   ALT 21 0 - 35 U/L   Total Protein 6.5 6.0 - 8.3 g/dL   Albumin 4.4 3.5 - 5.2 g/dL   Calcium 9.0 8.4 - 10.5 mg/dL   GFR 82.40 >60.00 mL/min  Hepatitis C antibody  Result Value Ref Range   Hepatitis C Ab NON-REACTIVE NON-REACTI   SIGNAL TO CUT-OFF 0.01 <1.00

## 2019-06-07 ENCOUNTER — Ambulatory Visit (INDEPENDENT_AMBULATORY_CARE_PROVIDER_SITE_OTHER): Payer: Medicare HMO | Admitting: Family Medicine

## 2019-06-07 ENCOUNTER — Other Ambulatory Visit (HOSPITAL_COMMUNITY)
Admission: RE | Admit: 2019-06-07 | Discharge: 2019-06-07 | Disposition: A | Discharge status: Home / Self Care | Payer: Medicare HMO | Source: Ambulatory Visit | Attending: Family Medicine | Admitting: Family Medicine

## 2019-06-07 ENCOUNTER — Other Ambulatory Visit: Payer: Self-pay

## 2019-06-07 ENCOUNTER — Encounter: Payer: Self-pay | Admitting: Family Medicine

## 2019-06-07 VITALS — BP 132/80 | HR 84 | Temp 98.1°F | Resp 16 | Ht 67.5 in | Wt 194.5 lb

## 2019-06-07 DIAGNOSIS — I10 Essential (primary) hypertension: Secondary | ICD-10-CM | POA: Diagnosis not present

## 2019-06-07 DIAGNOSIS — R748 Abnormal levels of other serum enzymes: Secondary | ICD-10-CM

## 2019-06-07 DIAGNOSIS — E785 Hyperlipidemia, unspecified: Secondary | ICD-10-CM

## 2019-06-07 DIAGNOSIS — Z1151 Encounter for screening for human papillomavirus (HPV): Secondary | ICD-10-CM | POA: Insufficient documentation

## 2019-06-07 DIAGNOSIS — Z1159 Encounter for screening for other viral diseases: Secondary | ICD-10-CM

## 2019-06-07 DIAGNOSIS — Z124 Encounter for screening for malignant neoplasm of cervix: Secondary | ICD-10-CM

## 2019-06-07 LAB — COMPREHENSIVE METABOLIC PANEL
ALT: 21 U/L (ref 0–35)
AST: 16 U/L (ref 0–37)
Albumin: 4.4 g/dL (ref 3.5–5.2)
Alkaline Phosphatase: 122 U/L — ABNORMAL HIGH (ref 39–117)
BUN: 20 mg/dL (ref 6–23)
CO2: 27 mEq/L (ref 19–32)
Calcium: 9 mg/dL (ref 8.4–10.5)
Chloride: 104 mEq/L (ref 96–112)
Creatinine, Ser: 0.71 mg/dL (ref 0.40–1.20)
GFR: 82.4 mL/min (ref >60.00)
Glucose, Bld: 99 mg/dL (ref 70–99)
Potassium: 4.3 mEq/L (ref 3.5–5.1)
Sodium: 138 mEq/L (ref 135–145)
Total Bilirubin: 0.3 mg/dL (ref 0.2–1.2)
Total Protein: 6.5 g/dL (ref 6.0–8.3)

## 2019-06-07 NOTE — Patient Instructions (Signed)
Great to see you today-  I will be in touch with your pap and labs asap  Your BP looks fine We an do a physical in about 6 months  Take care!

## 2019-06-08 ENCOUNTER — Encounter: Payer: Self-pay | Admitting: Family Medicine

## 2019-06-08 LAB — HEPATITIS C ANTIBODY
Hepatitis C Ab: NONREACTIVE
SIGNAL TO CUT-OFF: 0.01 (ref <1.00)

## 2019-06-12 ENCOUNTER — Ambulatory Visit (INDEPENDENT_AMBULATORY_CARE_PROVIDER_SITE_OTHER): Payer: Medicare HMO

## 2019-06-12 ENCOUNTER — Encounter: Payer: Self-pay | Admitting: Family Medicine

## 2019-06-12 DIAGNOSIS — J309 Allergic rhinitis, unspecified: Secondary | ICD-10-CM

## 2019-06-12 LAB — CYTOLOGY - PAP
Diagnosis: NEGATIVE
HPV: NOT DETECTED

## 2019-06-19 ENCOUNTER — Ambulatory Visit (INDEPENDENT_AMBULATORY_CARE_PROVIDER_SITE_OTHER): Payer: Medicare HMO

## 2019-06-19 DIAGNOSIS — J309 Allergic rhinitis, unspecified: Secondary | ICD-10-CM

## 2019-06-23 ENCOUNTER — Other Ambulatory Visit: Payer: Self-pay | Admitting: Pediatrics

## 2019-06-26 ENCOUNTER — Ambulatory Visit (INDEPENDENT_AMBULATORY_CARE_PROVIDER_SITE_OTHER): Payer: Medicare HMO

## 2019-06-26 DIAGNOSIS — J309 Allergic rhinitis, unspecified: Secondary | ICD-10-CM | POA: Diagnosis not present

## 2019-07-04 ENCOUNTER — Ambulatory Visit: Payer: Medicare HMO | Admitting: Psychiatry

## 2019-07-10 ENCOUNTER — Other Ambulatory Visit: Payer: Self-pay | Admitting: Family Medicine

## 2019-07-11 ENCOUNTER — Ambulatory Visit (INDEPENDENT_AMBULATORY_CARE_PROVIDER_SITE_OTHER): Payer: Medicare HMO

## 2019-07-11 DIAGNOSIS — J309 Allergic rhinitis, unspecified: Secondary | ICD-10-CM

## 2019-07-13 ENCOUNTER — Ambulatory Visit
Admission: RE | Admit: 2019-07-13 | Discharge: 2019-07-13 | Disposition: A | Discharge status: Home / Self Care | Payer: Medicare HMO | Source: Ambulatory Visit | Attending: Family Medicine | Admitting: Family Medicine

## 2019-07-13 ENCOUNTER — Other Ambulatory Visit: Payer: Self-pay

## 2019-07-13 DIAGNOSIS — Z1231 Encounter for screening mammogram for malignant neoplasm of breast: Secondary | ICD-10-CM

## 2019-07-14 ENCOUNTER — Ambulatory Visit: Payer: Medicare HMO

## 2019-07-17 ENCOUNTER — Ambulatory Visit (INDEPENDENT_AMBULATORY_CARE_PROVIDER_SITE_OTHER): Payer: Medicare HMO

## 2019-07-17 DIAGNOSIS — J309 Allergic rhinitis, unspecified: Secondary | ICD-10-CM

## 2019-07-18 ENCOUNTER — Other Ambulatory Visit: Payer: Self-pay

## 2019-07-18 NOTE — Progress Notes (Signed)
[place] [facility] [address], [place], [place] [address] [phone]  Date:  07/19/2019   Name:  Ashley Craig   DOB:  01/29/1951   MRN:  [id number]  PCP:  Darreld Mclean, MD    Chief Complaint: Hyperlipidemia (6 week follow up)   History of Present Illness:  Ashley Craig is a 68 y.o. very pleasant female patient who presents with the following:  Here today for periodic follow-up visit-last seen by myself in August History of mild asthma, hypertension, GERD, hyperlipidemia, disease of the cornea, chronic back pain  At her last labs her alkaline phosphatase was slightly high at 122.  This is also present in February.  I believe she wishes to discuss this further today  Flu shot- done already  Today she notes that her twin sister recently fell, had some rib films and then was eventually dx with a lung mass.  She has otherwise felt well.  They are otherwise in the process of working this up- will do a PET scan and then plan the next step Neither of them have been smokers Ashley Craig is really quite worried about her sister, of course they are very close.  Her brother has history of melanoma   Pt has noted some intermittent sx of "sciatica" on her left side. It makes it hard for her to go up steps sometimes She has pain with driving or prolonged sitting It does not generally radiate down her leg  No numbness or weakness of the leg She has noted this for about 2 weeks- will come and go   She sees Ashley Craig with [facility] tomorrow    BP Readings from Last 3 Encounters:  07/19/19 134/80  06/07/19 132/80  12/20/18 (!) 142/82     Patient Active Problem List   Diagnosis Date Noted  . Anaphylactic shock due to adverse food reaction 05/11/2018  . Seasonal allergic conjunctivitis 05/09/2018  . Impingement syndrome of left ankle 07/12/2017  . Diarrhea 05/21/2017  . Gastroesophageal reflux disease with  esophagitis 05/21/2017  . History of colon polyps 05/21/2017  . Orthostatic hypotension 04/08/2017  . Chronic pain of left ankle 03/30/2017  . Unilateral primary osteoarthritis, left knee 11/13/2016  . Allergic dermatitis 08/12/2016  . Chronic dryness of both eyes 06/02/2016  . Hypermetropia of both eyes 06/02/2016  . Open angle with borderline findings and low glaucoma risk in both eyes 06/02/2016  . Pinguecula of both eyes 06/02/2016  . Presbyopia of both eyes 06/02/2016  . Regular astigmatism of both eyes 06/02/2016  . Cellulitis of right lower extremity 01/30/2016  . Varicose veins of left lower extremity with pain 01/16/2016  . Mild persistent asthma without complication 36/46/8032  . Chronic venous insufficiency 08/07/2015  . Spontaneous hematoma of lower leg 11/30/2014  . Spinal stenosis of lumbar region 10/11/2014  . Allergic rhinitis 04/27/2014  . Adjustment disorder 04/27/2014  . Benign essential hypertension 04/27/2014  . Benign paroxysmal vertigo 04/27/2014  . Chondrocostal junction syndrome 04/27/2014  . Elevation of level of transaminase or lactic acid dehydrogenase (LDH) 04/27/2014  . 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 Overweight 04/27/2014  . Spontaneous ecchymosis 04/27/2014  . Tendonitis of shoulder 04/27/2014  . Gastroesophageal reflux disease 03/27/2014  . Anxiety 03/27/2014  . Environmental allergies 03/27/2014  . Hypercholesteremia 03/27/2014  . Insomnia 03/27/2014  . Vitamin D deficiency 03/27/2014    Past Medical History:  Diagnosis Date  . Anxiety  takes Zoloft daily;takes Clonazepam daily as needed  . Arthritis   . Chronic back pain    stenosis  . Diarrhea    occasionally  . GERD (gastroesophageal reflux disease)    takes Omeprazole daily   . History of blood clots 10 yrs ago   left calf after a bone break  . History of bronchitis 2014  . History of colon polyps    benign  . Hyperlipidemia    takes Simvastatin daily  . Hypertension    . Insomnia    takes Melatonin nightly  . Joint pain   . Seasonal allergies    takes Claritin daily;Nasonex and ProAir as needed;Singulair daily  . Weakness    numbness and tingling in right leg    Past Surgical History:  Procedure Laterality Date  . ANAL FISSURE REPAIR    . ANKLE SURGERY Left 09/2017  . colonosocpy    . D&C of bladder  12yr ago  . ESOPHAGOGASTRODUODENOSCOPY     with ED  . TONSILLECTOMY    . WRIST SURGERY Right    with plate    Social History   Tobacco Use  . Smoking status: Never Smoker  . Smokeless tobacco: Never Used  Substance Use Topics  . Alcohol use: Yes    Comment: wine daily  . Drug use: No    Family History  Problem Relation Age of Onset  . Allergic rhinitis Neg Hx   . Angioedema Neg Hx   . Asthma Neg Hx   . Eczema Neg Hx   . Immunodeficiency Neg Hx   . Urticaria Neg Hx   . Breast cancer Neg Hx     Allergies  Allergen Reactions  . Other     Shellfish-Anaphylaxis  . Codeine     nausea  . Dog Epithelium     Medication list has been reviewed and updated.  Current Outpatient Medications on File Prior to Visit  Medication Sig Dispense Refill  . albuterol (PROVENTIL HFA;VENTOLIN HFA) 108 (90 Base) MCG/ACT inhaler Inhale 2 puffs into the lungs every 4 (four) hours as needed for wheezing or shortness of breath. 1 Inhaler 0  . azelastine (ASTELIN) 0.1 % nasal spray USE ONE SPRAY IN EACH NOSTRIL TWICE A DAY IF NEEDED 30 mL 2  . clonazePAM (KLONOPIN) 0.5 MG tablet Take 0.5 mg by mouth daily as needed for anxiety.    . diclofenac sodium (VOLTAREN) 1 % GEL APPLY TO AFFECTED AREA 1-3 TIMES A DAY FOR 30 DAYS  1  . EPINEPHrine (AUVI-Q) 0.3 mg/0.3 mL IJ SOAJ injection Use as directed for severe allergic reactions 2 Device 1  . fluticasone (FLONASE) 50 MCG/ACT nasal spray DISPENSE 2 SPRAYS INTO EACH NOSTRIL ONCE DAILY FOR DRAINAGE 16 g 5  . ibuprofen (ADVIL,MOTRIN) 200 MG tablet Take 400 mg by mouth 3 (three) times daily as needed. Reported  on 11/05/2015    . lamoTRIgine (LAMICTAL) 200 MG tablet Take 200 mg by mouth daily.  0  . loratadine (CLARITIN) 10 MG tablet TAKE 1 TABLET BY MOUTH EVERY DAY 90 tablet 0  . Melatonin 3 MG TABS Take 3 mg by mouth at bedtime. Reported on 11/05/2015    . montelukast (SINGULAIR) 10 MG tablet Take 1 tablet (10 mg total) by mouth at bedtime. 90 tablet 3  . NON FORMULARY     . olopatadine (PATANOL) 0.1 % ophthalmic solution Place 1 drop into both eyes 2 (two) times daily. 5 mL 1  . omeprazole (PRILOSEC) 20 MG capsule  Take 1 capsule (20 mg total) by mouth daily. 30 capsule 5  . perindopril (ACEON) 2 MG tablet Take 1 tablet (2 mg total) by mouth daily. 90 tablet 3  . sertraline (ZOLOFT) 100 MG tablet TAKE 2 TABLETS BY MOUTH EVERY DAY    . simvastatin (ZOCOR) 20 MG tablet Take 1 tablet (20 mg total) by mouth daily. 90 tablet 0  . temazepam (RESTORIL) 30 MG capsule Take 30 mg by mouth at bedtime as needed for sleep. Reported on 11/05/2015     No current facility-administered medications on file prior to visit.     Review of Systems:  As per HPI- otherwise negative. No fever or chills, no chest pain or shortness of breath  Physical Examination: Vitals:   07/19/19 1058  BP: 134/80  Pulse: 77  Resp: 16  Temp: 97.7 F (36.5 C)  SpO2: 97%   Vitals:   07/19/19 1058  Weight: 199 lb (90.3 kg)  Height: 5' 7.5" (1.715 m)   Body mass index is 30.71 kg/m. Ideal Body Weight: Weight in (lb) to have BMI = 25: 161.7  GEN: WDWN, NAD, Non-toxic, A & O x 3, overweight, looks well HEENT: Atraumatic, Normocephalic. Neck supple. No masses, No LAD. Ears and Nose: No external deformity. CV: RRR, No M/G/R. No JVD. No thrill. No extra heart sounds. PULM: CTA B, no wheezes, crackles, rhonchi. No retractions. No resp. distress. No accessory muscle use. ABD: S, NT, ND, +BS. No rebound. No HSM. EXTR: No c/c/e NEURO Normal gait.  PSYCH: Normally interactive. Conversant. Not depressed or anxious appearing.  Calm  demeanor.  She has tenderness over the left sciatic notch.  Lumbar flexion is slightly limited, probably due to stiffness.  Lumbar extension is normal.  Normal bilateral lower extremity strength, sensation, DTR.  Negative left straight leg raise, hip with normal range of motion   Assessment and Plan: Elevated alkaline phosphatase level - Plan: Hepatic function panel, US Abdomen Limited RUQ, CANCELED: Gamma GT  Sciatica, left side   Elevated alk phos today.  Will check hepatic function panel including GGT.  If still elevated, plan for right upper quadrant ultrasound Discussed her sciatica.  Offered a prescription for prednisone.  She states she has some prednisone at home, she is not quite sure what strength.  She will look at her bottle when she gets home and send me a message so I can advise her  Signed Lamar Blinks, MD

## 2019-07-19 ENCOUNTER — Ambulatory Visit (INDEPENDENT_AMBULATORY_CARE_PROVIDER_SITE_OTHER): Payer: Medicare HMO | Admitting: Family Medicine

## 2019-07-19 ENCOUNTER — Encounter: Payer: Self-pay | Admitting: Family Medicine

## 2019-07-19 VITALS — BP 134/80 | HR 77 | Temp 97.7°F | Resp 16 | Ht 67.5 in | Wt 199.0 lb

## 2019-07-19 DIAGNOSIS — M5432 Sciatica, left side: Secondary | ICD-10-CM | POA: Diagnosis not present

## 2019-07-19 DIAGNOSIS — R748 Abnormal levels of other serum enzymes: Secondary | ICD-10-CM

## 2019-07-19 LAB — HEPATIC FUNCTION PANEL
ALT: 21 U/L (ref 0–35)
AST: 17 U/L (ref 0–37)
Albumin: 4.4 g/dL (ref 3.5–5.2)
Alkaline Phosphatase: 124 U/L — ABNORMAL HIGH (ref 39–117)
Bilirubin, Direct: 0.1 mg/dL (ref 0.0–0.3)
Total Bilirubin: 0.4 mg/dL (ref 0.2–1.2)
Total Protein: 6.5 g/dL (ref 6.0–8.3)

## 2019-07-19 NOTE — Patient Instructions (Addendum)
Good to see you again today!  I am so sorry to hear about your sister's issue.  Please let me know what her PET scan shows  We will repeat your alk phos and do a GGT today- if still abnormal I will set up a liver ultrasound A course of prednisone may help with your sciatica- if you will let me know what strength of prednisone you have on hand at home I will give you instructions  Please see me in about 6 months and take care

## 2019-07-20 ENCOUNTER — Ambulatory Visit: Payer: Medicare HMO | Admitting: Psychiatry

## 2019-07-20 ENCOUNTER — Other Ambulatory Visit (INDEPENDENT_AMBULATORY_CARE_PROVIDER_SITE_OTHER): Payer: Medicare HMO

## 2019-07-20 DIAGNOSIS — R748 Abnormal levels of other serum enzymes: Secondary | ICD-10-CM

## 2019-07-20 LAB — GAMMA GT: GGT: 72 U/L — ABNORMAL HIGH (ref 7–51)

## 2019-07-21 ENCOUNTER — Encounter: Payer: Self-pay | Admitting: Family Medicine

## 2019-07-24 ENCOUNTER — Ambulatory Visit (HOSPITAL_BASED_OUTPATIENT_CLINIC_OR_DEPARTMENT_OTHER)
Admission: RE | Admit: 2019-07-24 | Discharge: 2019-07-24 | Disposition: A | Discharge status: Home / Self Care | Payer: Medicare HMO | Source: Ambulatory Visit | Attending: Family Medicine | Admitting: Family Medicine

## 2019-07-24 ENCOUNTER — Other Ambulatory Visit: Payer: Self-pay

## 2019-07-24 ENCOUNTER — Ambulatory Visit (INDEPENDENT_AMBULATORY_CARE_PROVIDER_SITE_OTHER): Payer: Medicare HMO

## 2019-07-24 DIAGNOSIS — J309 Allergic rhinitis, unspecified: Secondary | ICD-10-CM

## 2019-07-24 DIAGNOSIS — R748 Abnormal levels of other serum enzymes: Secondary | ICD-10-CM | POA: Insufficient documentation

## 2019-07-27 ENCOUNTER — Encounter: Payer: Self-pay | Admitting: Family Medicine

## 2019-07-27 ENCOUNTER — Other Ambulatory Visit: Payer: Self-pay | Admitting: Family Medicine

## 2019-07-27 DIAGNOSIS — R748 Abnormal levels of other serum enzymes: Secondary | ICD-10-CM

## 2019-07-31 ENCOUNTER — Other Ambulatory Visit: Payer: Self-pay

## 2019-07-31 ENCOUNTER — Other Ambulatory Visit (INDEPENDENT_AMBULATORY_CARE_PROVIDER_SITE_OTHER): Payer: Medicare HMO

## 2019-07-31 ENCOUNTER — Ambulatory Visit (INDEPENDENT_AMBULATORY_CARE_PROVIDER_SITE_OTHER): Payer: Medicare HMO

## 2019-07-31 DIAGNOSIS — J309 Allergic rhinitis, unspecified: Secondary | ICD-10-CM

## 2019-07-31 DIAGNOSIS — R748 Abnormal levels of other serum enzymes: Secondary | ICD-10-CM | POA: Diagnosis not present

## 2019-08-01 LAB — MITOCHONDRIAL ANTIBODIES: Mitochondrial M2 Ab, IgG: <=20 U

## 2019-08-02 ENCOUNTER — Encounter: Payer: Self-pay | Admitting: Family Medicine

## 2019-08-05 ENCOUNTER — Other Ambulatory Visit: Payer: Self-pay | Admitting: Family Medicine

## 2019-08-09 ENCOUNTER — Encounter: Payer: Self-pay | Admitting: Psychiatry

## 2019-08-09 ENCOUNTER — Ambulatory Visit (INDEPENDENT_AMBULATORY_CARE_PROVIDER_SITE_OTHER): Payer: Medicare HMO | Admitting: Psychiatry

## 2019-08-09 ENCOUNTER — Ambulatory Visit (INDEPENDENT_AMBULATORY_CARE_PROVIDER_SITE_OTHER): Payer: Medicare HMO

## 2019-08-09 ENCOUNTER — Other Ambulatory Visit: Payer: Self-pay

## 2019-08-09 VITALS — BP 165/88 | HR 72 | Ht 67.5 in | Wt 199.0 lb

## 2019-08-09 DIAGNOSIS — J309 Allergic rhinitis, unspecified: Secondary | ICD-10-CM | POA: Diagnosis not present

## 2019-08-09 DIAGNOSIS — F411 Generalized anxiety disorder: Secondary | ICD-10-CM | POA: Diagnosis not present

## 2019-08-09 DIAGNOSIS — G4700 Insomnia, unspecified: Secondary | ICD-10-CM

## 2019-08-09 DIAGNOSIS — F329 Major depressive disorder, single episode, unspecified: Secondary | ICD-10-CM | POA: Diagnosis not present

## 2019-08-09 DIAGNOSIS — F32A Depression, unspecified: Secondary | ICD-10-CM

## 2019-08-09 MED ORDER — TEMAZEPAM 15 MG PO CAPS: ORAL_CAPSULE | ORAL | 2 refills | Status: DC

## 2019-08-09 MED ORDER — SERTRALINE HCL 100 MG PO TABS: 200.0000 mg | ORAL_TABLET | Freq: Every day | ORAL | 1 refills | Status: DC

## 2019-08-09 MED ORDER — LAMOTRIGINE 150 MG PO TABS: 300.0000 mg | ORAL_TABLET | Freq: Every day | ORAL | 1 refills | Status: DC

## 2019-08-09 MED ORDER — CLONAZEPAM 0.5 MG PO TABS: 0.5000 mg | ORAL_TABLET | Freq: Every day | ORAL | 2 refills | Status: DC | PRN
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
## 2019-08-11 ENCOUNTER — Encounter: Payer: Self-pay | Admitting: Family Medicine

## 2019-08-14 ENCOUNTER — Ambulatory Visit (INDEPENDENT_AMBULATORY_CARE_PROVIDER_SITE_OTHER): Payer: Medicare HMO

## 2019-08-14 DIAGNOSIS — J309 Allergic rhinitis, unspecified: Secondary | ICD-10-CM | POA: Diagnosis not present

## 2019-08-21 ENCOUNTER — Ambulatory Visit (INDEPENDENT_AMBULATORY_CARE_PROVIDER_SITE_OTHER): Payer: Medicare HMO

## 2019-08-21 DIAGNOSIS — J309 Allergic rhinitis, unspecified: Secondary | ICD-10-CM

## 2019-08-28 ENCOUNTER — Ambulatory Visit (INDEPENDENT_AMBULATORY_CARE_PROVIDER_SITE_OTHER): Payer: Medicare HMO

## 2019-08-28 DIAGNOSIS — J309 Allergic rhinitis, unspecified: Secondary | ICD-10-CM | POA: Diagnosis not present

## 2019-09-06 ENCOUNTER — Ambulatory Visit (INDEPENDENT_AMBULATORY_CARE_PROVIDER_SITE_OTHER): Payer: Medicare HMO

## 2019-09-06 DIAGNOSIS — J309 Allergic rhinitis, unspecified: Secondary | ICD-10-CM | POA: Diagnosis not present

## 2019-09-11 ENCOUNTER — Ambulatory Visit (INDEPENDENT_AMBULATORY_CARE_PROVIDER_SITE_OTHER): Payer: Medicare HMO

## 2019-09-11 DIAGNOSIS — J309 Allergic rhinitis, unspecified: Secondary | ICD-10-CM

## 2019-09-19 ENCOUNTER — Ambulatory Visit (INDEPENDENT_AMBULATORY_CARE_PROVIDER_SITE_OTHER): Payer: Medicare HMO

## 2019-09-19 DIAGNOSIS — J309 Allergic rhinitis, unspecified: Secondary | ICD-10-CM

## 2019-09-25 ENCOUNTER — Ambulatory Visit (INDEPENDENT_AMBULATORY_CARE_PROVIDER_SITE_OTHER): Payer: Medicare HMO | Admitting: Family Medicine

## 2019-09-25 ENCOUNTER — Ambulatory Visit: Payer: Self-pay

## 2019-09-25 ENCOUNTER — Other Ambulatory Visit: Payer: Self-pay

## 2019-09-25 ENCOUNTER — Encounter: Payer: Self-pay | Admitting: Family Medicine

## 2019-09-25 VITALS — Ht 68.0 in | Wt 190.0 lb

## 2019-09-25 DIAGNOSIS — T7800XD Anaphylactic reaction due to unspecified food, subsequent encounter: Secondary | ICD-10-CM

## 2019-09-25 DIAGNOSIS — J4530 Mild persistent asthma, uncomplicated: Secondary | ICD-10-CM

## 2019-09-25 DIAGNOSIS — J3081 Allergic rhinitis due to animal (cat) (dog) hair and dander: Secondary | ICD-10-CM | POA: Diagnosis not present

## 2019-09-25 DIAGNOSIS — H1010 Acute atopic conjunctivitis, unspecified eye: Secondary | ICD-10-CM

## 2019-09-25 DIAGNOSIS — J309 Allergic rhinitis, unspecified: Secondary | ICD-10-CM

## 2019-09-25 DIAGNOSIS — K219 Gastro-esophageal reflux disease without esophagitis: Secondary | ICD-10-CM

## 2019-09-25 MED ORDER — FLUTICASONE PROPIONATE 50 MCG/ACT NA SUSP: NASAL | 3 refills | Status: DC

## 2019-09-25 MED ORDER — PAZEO 0.7 % OP SOLN: 1.0000 [drp] | Freq: Every day | OPHTHALMIC | 3 refills | Status: DC | PRN

## 2019-09-25 MED ORDER — ALBUTEROL SULFATE HFA 108 (90 BASE) MCG/ACT IN AERS: 2.0000 | INHALATION_SPRAY | RESPIRATORY_TRACT | 1 refills | Status: DC | PRN

## 2019-09-25 MED ORDER — MONTELUKAST SODIUM 10 MG PO TABS: 10.0000 mg | ORAL_TABLET | Freq: Every day | ORAL | 3 refills | Status: DC

## 2019-09-25 MED ORDER — EPINEPHRINE 0.3 MG/0.3ML IJ SOAJ: INTRAMUSCULAR | 1 refills | Status: DC

## 2019-09-25 MED ORDER — ALBUTEROL SULFATE (2.5 MG/3ML) 0.083% IN NEBU: 2.5000 mg | INHALATION_SOLUTION | Freq: Four times a day (QID) | RESPIRATORY_TRACT | 1 refills | Status: DC | PRN

## 2019-09-25 NOTE — Progress Notes (Signed)
RE: Ashley Craig MRN: [id number] DOB: 07-06-1951 Date of Telemedicine Visit: 09/25/2019  Referring provider: Darreld Mclean, MD Primary care provider: Darreld Mclean, MD  Chief Complaint: Allergic Rhinitis  and Asthma   Telemedicine Follow Up Visit via Telephone: I connected with Ashley Craig for a follow up on 09/25/19 by telephone and verified that I am speaking with the correct person using two identifiers.   I discussed the limitations, risks, security and privacy concerns of performing an evaluation and management service by telephone and the availability of in person appointments. I also discussed with the patient that there may be a patient responsible charge related to this service. The patient expressed understanding and agreed to proceed.  Patient is at home  Provider is at the office.  Visit start time: 2:01 Visit end time: 2:38 Insurance consent/check in by: [place] consent and medical assistant/nurse: Gerre Pebbles  History of Present Illness: She is a 68 y.o. female, who is being followed for asthma. Allergic rhinitis on allergen immunotherapy, allergic conjunctivitis, reflux, and food allergy to shellfish. Her previous allergy office visit was on 12/20/2018 with Gareth Morgan, [place]. At today's visit, she reports her asthma has been well controlled with no shortenss of breath, cough or wheeze with activity or rest. She continues montelukast 10 mg once a day and rarely needs to use albuterol. Allergic rhinitis is reported as moderately well controlled with clear nasal drainage and thick post nasal drainage for which she is taking Claritin daily as well as Flonase and Astelin. She continues on allergen immunotherapy directed toward dog with no local reactions. She reports a significant decrease in her symptoms of allergic rhinitis while continuing on allergen immunotherapy. She reports bilateral occular pruritus for which she is using Systayne and Zaditor eye drops.  She reports these drops occasionally sting her eyes. Reflux is reported as well controlled with omeprazole 20 mg once a day as needed. She continues to avoid shellfish with no accidental ingestion since her last visit to this clinic. She reports her AuviQ devices have recently expired. Her current medications are listed in the chart.  Assessment and Plan: Ashley Craig is a 68 y.o. female with: Patient Instructions  Allergic rhinitis Continue allergen immunotherapy once a week.  Continue Claritin 10 mg once a day for runny nose and itchy eyes Continue fluticasone 2 sprays per nostril once a day  for stuffy nose Continue azelastine 1-2 sprays in each nostril twice a day for a runny nose  Allergic conjunctivitis Begin Pazeo eye drop in each eye once a day as needed for red, itchy eyes. This will replace Zaditor Continue to use a lubricating eye drop  Asthma Continue montelukast 10 mg once a day to prevent coughing or wheezing Continue ProAir 2 puffs every 4 hours if needed for cough or wheeze  Reflux Continue omeprazole 20 mg once a day for reflux Continue on your other medications     Food allergy Continue to avoid shellfish.  If you have an allergic reaction take Benadryl 50 mg every 4 hours, and  if you have life-threatening symptoms inject with AUVI-Q  0.3 mg  Call the clinic if this treatment plan is not working well for you  Follow up in 6 months or sooner if needed.    Return in about 6 months (around 03/24/2020), or if symptoms worsen or fail to improve.  Meds ordered this encounter  Medications  . Olopatadine HCl (PAZEO) 0.7 % SOLN    Sig: Place 1  drop into both eyes daily as needed.    Dispense:  7.5 mL    Refill:  3    Dispense 90 day supply.  . fluticasone (FLONASE) 50 MCG/ACT nasal spray    Sig: 2 sprays per nostril  once daily if needed  for stuffy nose.    Dispense:  48 g    Refill:  3    Dispense 90 day supply.  Marland Kitchen albuterol (VENTOLIN HFA) 108 (90 Base) MCG/ACT inhaler     Sig: Inhale 2 puffs into the lungs every 4 (four) hours as needed for wheezing or shortness of breath.    Dispense:  18 g    Refill:  1    May use Ventolin HFA brand name for insurance coverage.  Patient needs OV.  No additional refills.  . montelukast (SINGULAIR) 10 MG tablet    Sig: Take 1 tablet (10 mg total) by mouth at bedtime.    Dispense:  90 tablet    Refill:  3    On hold, pt will call.  Marland Kitchen albuterol (PROVENTIL) (2.5 MG/3ML) 0.083% nebulizer solution    Sig: Take 3 mLs (2.5 mg total) by nebulization every 6 (six) hours as needed for wheezing or shortness of breath.    Dispense:  75 mL    Refill:  1  . EPINEPHrine (AUVI-Q) 0.3 mg/0.3 mL IJ SOAJ injection    Sig: Use as directed for severe allergic reactions    Dispense:  2 each    Refill:  1    Medication List:  Current Outpatient Medications  Medication Sig Dispense Refill  . albuterol (PROVENTIL) (2.5 MG/3ML) 0.083% nebulizer solution Take 3 mLs (2.5 mg total) by nebulization every 6 (six) hours as needed for wheezing or shortness of breath. 75 mL 1  . albuterol (VENTOLIN HFA) 108 (90 Base) MCG/ACT inhaler Inhale 2 puffs into the lungs every 4 (four) hours as needed for wheezing or shortness of breath. 18 g 1  . azelastine (ASTELIN) 0.1 % nasal spray USE ONE SPRAY IN EACH NOSTRIL TWICE A DAY IF NEEDED 30 mL 2  . clonazePAM (KLONOPIN) 0.5 MG tablet Take 1 tablet (0.5 mg total) by mouth daily as needed for anxiety. 30 tablet 2  . EPINEPHrine (AUVI-Q) 0.3 mg/0.3 mL IJ SOAJ injection Use as directed for severe allergic reactions 2 each 1  . fluticasone (FLONASE) 50 MCG/ACT nasal spray 2 sprays per nostril  once daily if needed  for stuffy nose. 48 g 3  . ibuprofen (ADVIL,MOTRIN) 200 MG tablet Take 400 mg by mouth 3 (three) times daily as needed. Reported on 11/05/2015    . lamoTRIgine (LAMICTAL) 150 MG tablet Take 2 tablets (300 mg total) by mouth daily. 180 tablet 1  . loratadine (CLARITIN) 10 MG tablet TAKE 1 TABLET BY MOUTH  EVERY DAY 90 tablet 0  . Melatonin 3 MG TABS Take 3 mg by mouth at bedtime. Reported on 11/05/2015    . montelukast (SINGULAIR) 10 MG tablet Take 1 tablet (10 mg total) by mouth at bedtime. 90 tablet 3  . NON FORMULARY     . olopatadine (PATANOL) 0.1 % ophthalmic solution Place 1 drop into both eyes 2 (two) times daily. 5 mL 1  . omeprazole (PRILOSEC) 20 MG capsule Take 1 capsule (20 mg total) by mouth daily. (Patient taking differently: Take 20 mg by mouth daily as needed. ) 30 capsule 5  . perindopril (ACEON) 2 MG tablet Take 1 tablet (2 mg total) by mouth daily. 90  tablet 3  . sertraline (ZOLOFT) 100 MG tablet Take 2 tablets (200 mg total) by mouth daily. 180 tablet 1  . simvastatin (ZOCOR) 20 MG tablet TAKE 1 TABLET BY MOUTH EVERY DAY 90 tablet 1  . temazepam (RESTORIL) 15 MG capsule Take 1-2 caps po QHS 30 capsule 2  . diclofenac sodium (VOLTAREN) 1 % GEL APPLY TO AFFECTED AREA 1-3 TIMES A DAY FOR 30 DAYS  1  . Olopatadine HCl (PAZEO) 0.7 % SOLN Place 1 drop into both eyes daily as needed. 7.5 mL 3   No current facility-administered medications for this visit.    Allergies: Allergies  Allergen Reactions  . Other     Shellfish-Anaphylaxis  . Codeine     nausea  . Dog Epithelium    I reviewed her past medical history, social history, family history, and environmental history and no significant changes have been reported from previous visit on 12/20/2018.  Objective: Physical Exam Not obtained as encounter was done via telephone.   Previous notes and tests were reviewed.  I discussed the assessment and treatment plan with the patient. The patient was provided an opportunity to ask questions and all were answered. The patient agreed with the plan and demonstrated an understanding of the instructions.   The patient was advised to call back or seek an in-person evaluation if the symptoms worsen or if the condition fails to improve as anticipated.  I provided 37 minutes of  non-face-to-face time during this encounter.  It was my pleasure to participate in [place] Latour's care today. Please feel free to contact me with any questions or concerns.   Sincerely,  Thermon LeylandAnne Quindarius Cabello, FNP

## 2019-09-25 NOTE — Patient Instructions (Signed)
Allergic rhinitis Continue allergen immunotherapy once a week.  Continue Claritin 10 mg once a day for runny nose and itchy eyes Continue fluticasone 2 sprays per nostril once a day  for stuffy nose Continue azelastine 1-2 sprays in each nostril twice a day for a runny nose  Allergic conjunctivitis Begin Pazeo eye drop in each eye once a day as needed for red, itchy eyes. This will replace Zaditor Continue to use a lubricating eye drop  Asthma Continue montelukast 10 mg once a day to prevent coughing or wheezing Continue ProAir 2 puffs every 4 hours if needed for cough or wheeze  Reflux Continue omeprazole 20 mg once a day for reflux Continue on your other medications     Food allergy Continue to avoid shellfish.  If you have an allergic reaction take Benadryl 50 mg every 4 hours, and  if you have life-threatening symptoms inject with AUVI-Q  0.3 mg  Call the clinic if this treatment plan is not working well for you  Follow up in 6 months or sooner if needed.

## 2019-10-09 ENCOUNTER — Ambulatory Visit (INDEPENDENT_AMBULATORY_CARE_PROVIDER_SITE_OTHER): Payer: Medicare HMO

## 2019-10-09 ENCOUNTER — Other Ambulatory Visit: Payer: Self-pay | Admitting: Family Medicine

## 2019-10-09 DIAGNOSIS — J309 Allergic rhinitis, unspecified: Secondary | ICD-10-CM | POA: Diagnosis not present

## 2019-10-17 ENCOUNTER — Ambulatory Visit (INDEPENDENT_AMBULATORY_CARE_PROVIDER_SITE_OTHER): Payer: Medicare HMO

## 2019-10-17 DIAGNOSIS — J309 Allergic rhinitis, unspecified: Secondary | ICD-10-CM | POA: Diagnosis not present

## 2019-10-23 ENCOUNTER — Ambulatory Visit (INDEPENDENT_AMBULATORY_CARE_PROVIDER_SITE_OTHER): Payer: Medicare HMO

## 2019-10-23 DIAGNOSIS — J309 Allergic rhinitis, unspecified: Secondary | ICD-10-CM

## 2019-10-24 ENCOUNTER — Other Ambulatory Visit: Payer: Self-pay | Admitting: Family Medicine

## 2019-10-30 ENCOUNTER — Ambulatory Visit (INDEPENDENT_AMBULATORY_CARE_PROVIDER_SITE_OTHER): Payer: Medicare HMO

## 2019-10-30 DIAGNOSIS — J309 Allergic rhinitis, unspecified: Secondary | ICD-10-CM

## 2019-10-31 ENCOUNTER — Telehealth: Payer: Self-pay

## 2019-10-31 NOTE — Telephone Encounter (Signed)
Copied from [place] [id number]. Topic: Medicare AWV >> Oct 31, 2019  1:07 PM Burchel, Abbi R wrote: Reason for CRM: Pt would like to sched AWV, Please call pt to sched: [phone]

## 2019-11-07 ENCOUNTER — Ambulatory Visit (INDEPENDENT_AMBULATORY_CARE_PROVIDER_SITE_OTHER): Payer: Medicare HMO

## 2019-11-07 DIAGNOSIS — J309 Allergic rhinitis, unspecified: Secondary | ICD-10-CM | POA: Diagnosis not present

## 2019-11-09 ENCOUNTER — Other Ambulatory Visit: Payer: Self-pay

## 2019-11-09 ENCOUNTER — Ambulatory Visit (INDEPENDENT_AMBULATORY_CARE_PROVIDER_SITE_OTHER): Payer: Medicare HMO | Admitting: Psychiatry

## 2019-11-09 ENCOUNTER — Encounter: Payer: Self-pay | Admitting: Psychiatry

## 2019-11-09 VITALS — BP 136/74 | HR 68

## 2019-11-09 DIAGNOSIS — F411 Generalized anxiety disorder: Secondary | ICD-10-CM | POA: Diagnosis not present

## 2019-11-09 DIAGNOSIS — F32A Depression, unspecified: Secondary | ICD-10-CM

## 2019-11-09 DIAGNOSIS — G4700 Insomnia, unspecified: Secondary | ICD-10-CM

## 2019-11-09 DIAGNOSIS — F329 Major depressive disorder, single episode, unspecified: Secondary | ICD-10-CM

## 2019-11-09 MED ORDER — SERTRALINE HCL 100 MG PO TABS: 200.0000 mg | ORAL_TABLET | Freq: Every day | ORAL | 1 refills | Status: DC

## 2019-11-09 NOTE — Progress Notes (Signed)
Virtual Visit via Video Note  I connected with patient on 11/10/19 at 11:00 AM EST by audio enabled telemedicine application and verified that I am speaking with the correct person using two identifiers.   THIS ENCOUNTER IS A VIRTUAL VISIT DUE TO COVID-19 - PATIENT WAS NOT SEEN IN THE OFFICE. PATIENT HAS CONSENTED TO VIRTUAL VISIT / TELEMEDICINE VISIT   Location of patient: home  Location of provider: office  I discussed the limitations of evaluation and management by telemedicine and the availability of in person appointments. The patient expressed understanding and agreed to proceed.   Subjective:   Ashley Craig is a 69 y.o. female who presents for an Initial Medicare Annual Wellness Visit.  Review of Systems    Home Safety/Smoke Alarms: Feels safe in home. Smoke alarms in place.  Lives alone in 1 story. Walk-in shower.  Female:   Mammo- 07/14/19      Dexa scan-  12/08/18      [facility]- pt reports last 12/2017 w/ 4 yr recall- Dr.Shearin. will send record request.    Objective:    Today's Vitals   11/10/19 1101  BP: 136/74  Pulse: 68   There is no height or weight on file to calculate BMI.  Advanced Directives 11/10/2019 10/31/2014 10/27/2014 10/03/2014  Does Patient Have a Medical Advance Directive? Yes No No Yes  Type of Paramedic of [place];Living will - - [place];Living will  Does patient want to make changes to medical advance directive? No - Patient declined - - -  Copy of [place] in Chart? No - copy requested - - Yes  Would patient like information on creating a medical advance directive? - No - patient declined information No - patient declined information -    Current Medications (verified) Outpatient Encounter Medications as of 11/10/2019  Medication Sig  . albuterol (PROVENTIL) (2.5 MG/3ML) 0.083% nebulizer solution Take 3 mLs (2.5 mg total) by nebulization every 6 (six) hours as needed for wheezing  or shortness of breath.  Marland Kitchen albuterol (VENTOLIN HFA) 108 (90 Base) MCG/ACT inhaler Inhale 2 puffs into the lungs every 4 (four) hours as needed for wheezing or shortness of breath.  Marland Kitchen azelastine (ASTELIN) 0.1 % nasal spray USE ONE SPRAY IN EACH NOSTRIL TWICE A DAY IF NEEDED  . clonazePAM (KLONOPIN) 0.5 MG tablet Take 1 tablet (0.5 mg total) by mouth daily as needed for anxiety.  . diclofenac sodium (VOLTAREN) 1 % GEL APPLY TO AFFECTED AREA 1-3 TIMES A DAY FOR 30 DAYS  . fluticasone (FLONASE) 50 MCG/ACT nasal spray 2 sprays per nostril  once daily if needed  for stuffy nose.  . ibuprofen (ADVIL,MOTRIN) 200 MG tablet Take 400 mg by mouth 3 (three) times daily as needed. Reported on 11/05/2015  . loratadine (CLARITIN) 10 MG tablet TAKE 1 TABLET BY MOUTH EVERY DAY  . Melatonin 3 MG TABS Take 3 mg by mouth at bedtime. Reported on 11/05/2015  . montelukast (SINGULAIR) 10 MG tablet Take 1 tablet (10 mg total) by mouth at bedtime.  . NON FORMULARY   . olopatadine (PATANOL) 0.1 % ophthalmic solution Place 1 drop into both eyes 2 (two) times daily.  . Olopatadine HCl (PAZEO) 0.7 % SOLN Place 1 drop into both eyes daily as needed.  Marland Kitchen omeprazole (PRILOSEC) 20 MG capsule Take 1 capsule (20 mg total) by mouth daily. (Patient taking differently: Take 20 mg by mouth daily as needed. )  . perindopril (ACEON) 2 MG  tablet TAKE 1 TABLET BY MOUTH EVERY DAY  . sertraline (ZOLOFT) 100 MG tablet Take 2 tablets (200 mg total) by mouth daily.  . simvastatin (ZOCOR) 20 MG tablet TAKE 1 TABLET BY MOUTH EVERY DAY  . temazepam (RESTORIL) 15 MG capsule Take 1-2 caps po QHS  . EPINEPHrine (AUVI-Q) 0.3 mg/0.3 mL IJ SOAJ injection Use as directed for severe allergic reactions (Patient not taking: Reported on 11/10/2019)  . lamoTRIgine (LAMICTAL) 150 MG tablet Take 2 tablets (300 mg total) by mouth daily.   No facility-administered encounter medications on file as of 11/10/2019.    Allergies (verified) Other, Codeine, and Dog  epithelium   History: Past Medical History:  Diagnosis Date  . Anxiety    takes Zoloft daily;takes Clonazepam daily as needed  . Arthritis   . Chronic back pain    stenosis  . Diarrhea    occasionally  . GERD (gastroesophageal reflux disease)    takes Omeprazole daily   . History of blood clots 10 yrs ago   left calf after a bone break  . History of bronchitis 2014  . History of colon polyps    benign  . Hyperlipidemia    takes Simvastatin daily  . Hypertension   . Insomnia    takes Melatonin nightly  . Joint pain   . Seasonal allergies    takes Claritin daily;Nasonex and ProAir as needed;Singulair daily  . Weakness    numbness and tingling in right leg   Past Surgical History:  Procedure Laterality Date  . ANAL FISSURE REPAIR    . ANKLE SURGERY Left 09/2017  . colonosocpy    . D&C of bladder  80yrs ago  . ESOPHAGOGASTRODUODENOSCOPY     with ED  . TONSILLECTOMY    . WRIST SURGERY Right    with plate   Family History  Problem Relation Age of Onset  . Cancer Mother   . Retinitis pigmentosa Father   . Depression Father   . Anxiety disorder Sister   . Melanoma Brother   . Anxiety disorder Brother   . Anorexia nervosa Brother   . Leukemia Niece   . Anorexia nervosa Cousin   . Allergic rhinitis Neg Hx   . Angioedema Neg Hx   . Asthma Neg Hx   . Eczema Neg Hx   . Immunodeficiency Neg Hx   . Urticaria Neg Hx   . Breast cancer Neg Hx    Social History   Socioeconomic History  . Marital status: Single    Spouse name: Not on file  . Number of children: Not on file  . Years of education: Not on file  . Highest education level: Not on file  Occupational History  . Not on file  Tobacco Use  . Smoking status: Never Smoker  . Smokeless tobacco: Never Used  Substance and Sexual Activity  . Alcohol use: Yes    Comment: 1-2 glasses of wine daily  . Drug use: No  . Sexual activity: Not on file  Other Topics Concern  . Not on file  Social History Narrative    . Not on file   Social Determinants of Health   Financial Resource Strain:   . Difficulty of Paying Living Expenses: Not on file  Food Insecurity:   . Worried About Programme researcher, broadcasting/film/video in the Last Year: Not on file  . Ran Out of Food in the Last Year: Not on file  Transportation Needs:   . Lack of Transportation (Medical): Not  on file  . Lack of Transportation (Non-Medical): Not on file  Physical Activity:   . Days of Exercise per Week: Not on file  . Minutes of Exercise per Session: Not on file  Stress:   . Feeling of Stress : Not on file  Social Connections:   . Frequency of Communication with Friends and Family: Not on file  . Frequency of Social Gatherings with Friends and Family: Not on file  . Attends Religious Services: Not on file  . Active Member of Clubs or Organizations: Not on file  . Attends Banker Meetings: Not on file  . Marital Status: Not on file    Tobacco Counseling Counseling given: Not Answered   Clinical Intake: Pain : No/denies pain    Activities of Daily Living In your present state of health, do you have any difficulty performing the following activities: 11/10/2019  Hearing? N  Vision? N  Difficulty concentrating or making decisions? N  Walking or climbing stairs? N  Dressing or bathing? N  Doing errands, shopping? N  Preparing Food and eating ? N  Using the Toilet? N  In the past six months, have you accidently leaked urine? N  Do you have problems with loss of bowel control? N  Managing your Medications? N  Managing your Finances? N  Housekeeping or managing your Housekeeping? N  Some recent data might be hidden     Immunizations and Health Maintenance Immunization History  Administered Date(s) Administered  . Hepatitis B 11/07/2009  . Influenza,inj,Quad PF,6+ Mos 08/07/2015, 08/12/2016, 07/28/2018  . Influenza,inj,quad, With Preservative 07/24/2014  . Influenza-Unspecified 08/14/2005, 08/20/2006, 08/15/2007,  07/10/2008, 08/06/2010, 08/19/2011, 07/19/2012, 07/20/2013, 07/24/2014, 08/07/2015, 08/12/2016, 08/17/2017, 07/28/2018  . Pneumococcal Conjugate-13 08/12/2016  . Pneumococcal Polysaccharide-23 07/24/2014  . Tdap 11/07/2009  . Zoster Recombinat (Shingrix) 02/26/2017, 05/02/2017   Health Maintenance Due  Topic Date Due  . PNA vac Low Risk Adult (2 of 2 - PPSV23) 07/25/2019  . TETANUS/TDAP  11/08/2019    Patient Care Team: Copland, Gwenlyn Found, MD as PCP - General (Family Medicine) Corie Chiquito, PMHNP as Nurse Practitioner (Psychiatry)  Indicate any recent Medical Services you may have received from other than [facility] providers in the past year (date may be approximate).     Assessment:   This is a routine wellness examination for Jaia. Physical assessment deferred to PCP.  Hearing/Vision screen Unable to assess. This visit is enabled though telemedicine due to Covid 19.   Dietary issues and exercise activities discussed: Current Exercise Habits: The patient does not participate in regular exercise at present, Exercise limited by: None identified Diet (meal preparation, eat out, water intake, caffeinated beverages, dairy products, fruits and vegetables): 24 hr recall Breakfast: special k Lunch: pimento chs sandwich Dinner: beef stew, carrots, rice    Goals    . Increase physical activity      Depression Screen PHQ 2/9 Scores 11/10/2019  PHQ - 2 Score 0    Fall Risk Fall Risk  11/10/2019  Falls in the past year? 0  Follow up Education provided;Falls prevention discussed    Cognitive Function: Ad8 score reviewed for issues:  Issues making decisions:no  Less interest in hobbies / activities:no  Repeats questions, stories (family complaining):no  Trouble using ordinary gadgets (microwave, computer, phone):no  Forgets the month or year: no  Mismanaging finances: no  Remembering appts:no  Daily problems with thinking and/or memory:no Ad8 score is=0          Screening Tests Health Maintenance  Topic  Date Due  . PNA vac Low Risk Adult (2 of 2 - PPSV23) 07/25/2019  . TETANUS/TDAP  11/08/2019  . MAMMOGRAM  07/12/2021  . COLONOSCOPY  02/27/2028  . INFLUENZA VACCINE  Completed  . DEXA SCAN  Completed  . Hepatitis C Screening  Completed     Plan:    Please schedule your next medicare wellness visit with me in 1 yr.  Continue to eat heart healthy diet (full of fruits, vegetables, whole grains, lean protein, water--limit salt, fat, and sugar intake) and increase physical activity as tolerated.  Continue doing brain stimulating activities (puzzles, reading, adult coloring books, staying active) to keep memory sharp.      I have personally reviewed and noted the following in the patient's chart:   . Medical and social history . Use of alcohol, tobacco or illicit drugs  . Current medications and supplements . Functional ability and status . Nutritional status . Physical activity . Advanced directives . List of other physicians . Hospitalizations, surgeries, and ER visits in previous 12 months . Vitals . Screenings to include cognitive, depression, and falls . Referrals and appointments  In addition, I have reviewed and discussed with patient certain preventive protocols, quality metrics, and best practice recommendations. A written personalized care plan for preventive services as well as general preventive health recommendations were provided to patient.     Avon Gully, [place]   11/10/2019

## 2019-11-09 NOTE — Progress Notes (Signed)
Ashley Craig [id number] Sep 22, 1951 69 y.o.  Subjective:   Patient ID:  Ashley Craig is a 69 y.o. (DOB 1951/01/15) female.  Chief Complaint:  Chief Complaint  Patient presents with  . Follow-up    History of anxiety and mood disturbance    HPI Ashley Craig presents to the office today for follow-up of mood and anxiety. She reports that she is easily irritated at times and is feeling restless today. She reports that Klonopin prn is helpful for restlessness. Reports that she is easily frustrated with things like long calls with customer service. She reports that her mood and anxiety are better when she is around people. Has had situational anxiety that has been manageable. Denies any recent depression. Sleeping well. She reports that she has been procrastinating and this is not typical for her. Motivation has been lower. She reports, "once I get into it, I am ok." Energy is ok. Concentration is ok and has been reading and able to focus on this. Denies SI.   Has been staying at home most of the time. Brother has been doing ok with cancer treatments. She reports that she had a nice Christmas. Recently had to help a friend with scleroderma that fell and fx'd her arm. Enjoys her dog.   Past Psychiatric Medication Trials: Klonopin- Effective for anxiety. She reports that she has gone weeks without taking it and takes more of it during times of increased stress Lamictal- Has helped stabilize mood. Reports that she has been on higher and lower doses. Has taken at least one year.  Effexor XR- Severe discontinuation s/s.  Prozac Zoloft- Has taken long-term Temazepam- Has used prn when traveling  Review of Systems:  Review of Systems  HENT: Positive for rhinorrhea.   Musculoskeletal: Positive for arthralgias. Negative for gait problem.       Ankle and foot pain  Allergic/Immunologic: Positive for environmental allergies.  Neurological: Negative for tremors.  Psychiatric/Behavioral:    Please refer to HPI    Medications: I have reviewed the patient's current medications.  Current Outpatient Medications  Medication Sig Dispense Refill  . albuterol (PROVENTIL) (2.5 MG/3ML) 0.083% nebulizer solution Take 3 mLs (2.5 mg total) by nebulization every 6 (six) hours as needed for wheezing or shortness of breath. 75 mL 1  . albuterol (VENTOLIN HFA) 108 (90 Base) MCG/ACT inhaler Inhale 2 puffs into the lungs every 4 (four) hours as needed for wheezing or shortness of breath. 18 g 1  . azelastine (ASTELIN) 0.1 % nasal spray USE ONE SPRAY IN EACH NOSTRIL TWICE A DAY IF NEEDED 30 mL 2  . clonazePAM (KLONOPIN) 0.5 MG tablet Take 1 tablet (0.5 mg total) by mouth daily as needed for anxiety. 30 tablet 2  . diclofenac sodium (VOLTAREN) 1 % GEL APPLY TO AFFECTED AREA 1-3 TIMES A DAY FOR 30 DAYS  1  . EPINEPHrine (AUVI-Q) 0.3 mg/0.3 mL IJ SOAJ injection Use as directed for severe allergic reactions (Patient not taking: Reported on 11/10/2019) 2 each 1  . fluticasone (FLONASE) 50 MCG/ACT nasal spray 2 sprays per nostril  once daily if needed  for stuffy nose. 48 g 3  . ibuprofen (ADVIL,MOTRIN) 200 MG tablet Take 400 mg by mouth 3 (three) times daily as needed. Reported on 11/05/2015    . loratadine (CLARITIN) 10 MG tablet TAKE 1 TABLET BY MOUTH EVERY DAY 90 tablet 1  . Melatonin 3 MG TABS Take 3 mg by mouth at bedtime. Reported on 11/05/2015    .  montelukast (SINGULAIR) 10 MG tablet Take 1 tablet (10 mg total) by mouth at bedtime. 90 tablet 3  . NON FORMULARY     . olopatadine (PATANOL) 0.1 % ophthalmic solution Place 1 drop into both eyes 2 (two) times daily. 5 mL 1  . Olopatadine HCl (PAZEO) 0.7 % SOLN Place 1 drop into both eyes daily as needed. 7.5 mL 3  . omeprazole (PRILOSEC) 20 MG capsule Take 1 capsule (20 mg total) by mouth daily. (Patient taking differently: Take 20 mg by mouth daily as needed. ) 30 capsule 5  . perindopril (ACEON) 2 MG tablet TAKE 1 TABLET BY MOUTH EVERY DAY 90 tablet 3  .  sertraline (ZOLOFT) 100 MG tablet Take 2 tablets (200 mg total) by mouth daily. 180 tablet 1  . simvastatin (ZOCOR) 20 MG tablet TAKE 1 TABLET BY MOUTH EVERY DAY 90 tablet 1  . temazepam (RESTORIL) 15 MG capsule Take 1-2 caps po QHS 30 capsule 2  . lamoTRIgine (LAMICTAL) 150 MG tablet Take 2 tablets (300 mg total) by mouth daily. 180 tablet 1   No current facility-administered medications for this visit.    Medication Side Effects: None  Allergies:  Allergies  Allergen Reactions  . Other     Shellfish-Anaphylaxis  . Codeine     nausea  . Dog Epithelium     Past Medical History:  Diagnosis Date  . Anxiety    takes Zoloft daily;takes Clonazepam daily as needed  . Arthritis   . Chronic back pain    stenosis  . Diarrhea    occasionally  . GERD (gastroesophageal reflux disease)    takes Omeprazole daily   . History of blood clots 10 yrs ago   left calf after a bone break  . History of bronchitis 2014  . History of colon polyps    benign  . Hyperlipidemia    takes Simvastatin daily  . Hypertension   . Insomnia    takes Melatonin nightly  . Joint pain   . Seasonal allergies    takes Claritin daily;Nasonex and ProAir as needed;Singulair daily  . Weakness    numbness and tingling in right leg    Family History  Problem Relation Age of Onset  . Cancer Mother   . Retinitis pigmentosa Father   . Depression Father   . Anxiety disorder Sister   . Melanoma Brother   . Anxiety disorder Brother   . Anorexia nervosa Brother   . Leukemia Niece   . Anorexia nervosa Cousin   . Allergic rhinitis Neg Hx   . Angioedema Neg Hx   . Asthma Neg Hx   . Eczema Neg Hx   . Immunodeficiency Neg Hx   . Urticaria Neg Hx   . Breast cancer Neg Hx     Social History   Socioeconomic History  . Marital status: Single    Spouse name: Not on file  . Number of children: Not on file  . Years of education: Not on file  . Highest education level: Not on file  Occupational History  .  Not on file  Tobacco Use  . Smoking status: Never Smoker  . Smokeless tobacco: Never Used  Substance and Sexual Activity  . Alcohol use: Yes    Comment: 1-2 glasses of wine daily  . Drug use: No  . Sexual activity: Not on file  Other Topics Concern  . Not on file  Social History Narrative  . Not on file   Social Determinants of  Health   Financial Resource Strain:   . Difficulty of Paying Living Expenses: Not on file  Food Insecurity:   . Worried About Charity fundraiser in the Last Year: Not on file  . Ran Out of Food in the Last Year: Not on file  Transportation Needs:   . Lack of Transportation (Medical): Not on file  . Lack of Transportation (Non-Medical): Not on file  Physical Activity:   . Days of Exercise per Week: Not on file  . Minutes of Exercise per Session: Not on file  Stress:   . Feeling of Stress : Not on file  Social Connections:   . Frequency of Communication with Friends and Family: Not on file  . Frequency of Social Gatherings with Friends and Family: Not on file  . Attends Religious Services: Not on file  . Active Member of Clubs or Organizations: Not on file  . Attends Archivist Meetings: Not on file  . Marital Status: Not on file  Intimate Partner Violence:   . Fear of Current or Ex-Partner: Not on file  . Emotionally Abused: Not on file  . Physically Abused: Not on file  . Sexually Abused: Not on file    Past Medical History, Surgical history, Social history, and Family history were reviewed and updated as appropriate.   Please see review of systems for further details on the patient's review from today.   Objective:   Physical Exam:  BP 136/74   Pulse 68   Physical Exam Constitutional:      General: She is not in acute distress.    Appearance: She is well-developed.  Musculoskeletal:        General: No deformity.  Neurological:     Mental Status: She is alert and oriented to person, place, and time.     Coordination:  Coordination normal.  Psychiatric:        Attention and Perception: Attention and perception normal. She does not perceive auditory or visual hallucinations.        Mood and Affect: Mood normal. Mood is not anxious or depressed. Affect is not labile, blunt, angry or inappropriate.        Speech: Speech normal.        Behavior: Behavior normal.        Thought Content: Thought content normal. Thought content is not paranoid or delusional. Thought content does not include homicidal or suicidal ideation. Thought content does not include homicidal or suicidal plan.        Cognition and Memory: Cognition and memory normal.        Judgment: Judgment normal.     Comments: Insight intact     Lab Review:     Component Value Date/Time   NA 138 06/07/2019 1326   K 4.3 06/07/2019 1326   CL 104 06/07/2019 1326   CO2 27 06/07/2019 1326   GLUCOSE 99 06/07/2019 1326   BUN 20 06/07/2019 1326   CREATININE 0.71 06/07/2019 1326   CALCIUM 9.0 06/07/2019 1326   PROT 6.5 07/19/2019 1127   ALBUMIN 4.4 07/19/2019 1127   AST 17 07/19/2019 1127   ALT 21 07/19/2019 1127   ALKPHOS 124 (H) 07/19/2019 1127   BILITOT 0.4 07/19/2019 1127   GFRNONAA >90 10/03/2014 1027   GFRAA >90 10/03/2014 1027       Component Value Date/Time   WBC 5.3 12/07/2018 1007   RBC 4.98 12/07/2018 1007   HGB 14.5 12/07/2018 1007   HCT 43.3 12/07/2018 1007  PLT 291.0 12/07/2018 1007   MCV 86.9 12/07/2018 1007   MCH 29.2 10/03/2014 1027   MCHC 33.6 12/07/2018 1007   RDW 13.3 12/07/2018 1007    No results found for: POCLITH, LITHIUM   No results found for: PHENYTOIN, PHENOBARB, VALPROATE, CBMZ   .res Assessment: Plan:   Will continue current plan of care since patient reports that mood and anxiety signs and symptoms are well controlled overall without any significant tolerability issues.  Continue Lamictal 300 mg daily for mood signs and symptoms.  Patient reports that she does not need a refill at this time. Continue  Klonopin as needed for anxiety and insomnia.  Patient currently has refills remaining. Continue Zoloft 200 mg daily for depression and anxiety. Patient to follow-up in 3 months or sooner if clinically indicated. Patient advised to contact office with any questions, adverse effects, or acute worsening in signs and symptoms.  Latrease was seen today for follow-up.  Diagnoses and all orders for this visit:  Generalized anxiety disorder -     sertraline (ZOLOFT) 100 MG tablet; Take 2 tablets (200 mg total) by mouth daily.  Depression, unspecified depression type -     sertraline (ZOLOFT) 100 MG tablet; Take 2 tablets (200 mg total) by mouth daily.  Insomnia, unspecified type     Please see After Visit Summary for patient specific instructions.  Future Appointments  Date Time Provider Department Center  02/07/2020 10:00 AM Corie Chiquito, PMHNP [facility] None  02/21/2020  2:00 PM Ambs, Norvel Richards, FNP [facility] None  11/11/2020  1:45 PM Glenna Durand, RN [facility]    No orders of the defined types were placed in this encounter.   -------------------------------

## 2019-11-10 ENCOUNTER — Encounter: Payer: Self-pay | Admitting: *Deleted

## 2019-11-10 ENCOUNTER — Ambulatory Visit (INDEPENDENT_AMBULATORY_CARE_PROVIDER_SITE_OTHER): Payer: Medicare HMO | Admitting: *Deleted

## 2019-11-10 ENCOUNTER — Encounter: Payer: Self-pay | Admitting: Family Medicine

## 2019-11-10 VITALS — BP 136/74 | HR 68

## 2019-11-10 DIAGNOSIS — Z0000 Encounter for general adult medical examination without abnormal findings: Secondary | ICD-10-CM | POA: Diagnosis not present

## 2019-11-10 NOTE — Patient Instructions (Addendum)
Please schedule your next medicare wellness visit with me in 1 yr.  Continue to eat heart healthy diet (full of fruits, vegetables, whole grains, lean protein, water--limit salt, fat, and sugar intake) and increase physical activity as tolerated.  Continue doing brain stimulating activities (puzzles, reading, adult coloring books, staying active) to keep memory sharp.   Ms. Ashley Craig , Thank you for taking time to come for your Medicare Wellness Visit. I appreciate your ongoing commitment to your health goals. Please review the following plan we discussed and let me know if I can assist you in the future.   These are the goals we discussed: Goals    . Increase physical activity       This is a list of the screening recommended for you and due dates:  Health Maintenance  Topic Date Due  . Pneumonia vaccines (2 of 2 - PPSV23) 07/25/2019  . Tetanus Vaccine  11/08/2019  . Mammogram  07/12/2021  . Colon Cancer Screening  02/27/2028  . Flu Shot  Completed  . DEXA scan (bone density measurement)  Completed  .  Hepatitis C: One time screening is recommended by Center for Disease Control  (CDC) for  adults born from [birth year] through 1963.   Completed    Preventive Care 12 Years and Older, Female Preventive care refers to lifestyle choices and visits with your health care provider that can promote health and wellness. This includes:  A yearly physical exam. This is also called an annual well check.  Regular dental and eye exams.  Immunizations.  Screening for certain conditions.  Healthy lifestyle choices, such as diet and exercise. What can I expect for my preventive care visit? Physical exam Your health care provider will check:  Height and weight. These may be used to calculate body mass index (BMI), which is a measurement that tells if you are at a healthy weight.  Heart rate and blood pressure.  Your skin for abnormal spots. Counseling Your health care provider may ask you  questions about:  Alcohol, tobacco, and drug use.  Emotional well-being.  Home and relationship well-being.  Sexual activity.  Eating habits.  History of falls.  Memory and ability to understand (cognition).  Work and work Statistician.  Pregnancy and menstrual history. What immunizations do I need?  Influenza (flu) vaccine  This is recommended every year. Tetanus, diphtheria, and pertussis (Tdap) vaccine  You may need a Td booster every 10 years. Varicella (chickenpox) vaccine  You may need this vaccine if you have not already been vaccinated. Zoster (shingles) vaccine  You may need this after age 67. Pneumococcal conjugate (PCV13) vaccine  One dose is recommended after age 67. Pneumococcal polysaccharide (PPSV23) vaccine  One dose is recommended after age 67. Measles, mumps, and rubella (MMR) vaccine  You may need at least one dose of MMR if you were born in 1957 or later. You may also need a second dose. Meningococcal conjugate (MenACWY) vaccine  You may need this if you have certain conditions. Hepatitis A vaccine  You may need this if you have certain conditions or if you travel or work in places where you may be exposed to hepatitis A. Hepatitis B vaccine  You may need this if you have certain conditions or if you travel or work in places where you may be exposed to hepatitis B. Haemophilus influenzae type b (Hib) vaccine  You may need this if you have certain conditions. You may receive vaccines as individual doses or as more than  one vaccine together in one shot (combination vaccines). Talk with your health care provider about the risks and benefits of combination vaccines. What tests do I need? Blood tests  Lipid and cholesterol levels. These may be checked every 5 years, or more frequently depending on your overall health.  Hepatitis C test.  Hepatitis B test. Screening  Lung cancer screening. You may have this screening every year starting at  age 67 if you have a 30-pack-year history of smoking and currently smoke or have quit within the past 15 years.  Colorectal cancer screening. All adults should have this screening starting at age 67 and continuing until age 67. Your health care provider may recommend screening at age 67 if you are at increased risk. You will have tests every 1-10 years, depending on your results and the type of screening test.  Diabetes screening. This is done by checking your blood sugar (glucose) after you have not eaten for a while (fasting). You may have this done every 1-3 years.  Mammogram. This may be done every 1-2 years. Talk with your health care provider about how often you should have regular mammograms.  BRCA-related cancer screening. This may be done if you have a family history of breast, ovarian, tubal, or peritoneal cancers. Other tests  Sexually transmitted disease (STD) testing.  Bone density scan. This is done to screen for osteoporosis. You may have this done starting at age 67. Follow these instructions at home: Eating and drinking  Eat a diet that includes fresh fruits and vegetables, whole grains, lean protein, and low-fat dairy products. Limit your intake of foods with high amounts of sugar, saturated fats, and salt.  Take vitamin and mineral supplements as recommended by your health care provider.  Do not drink alcohol if your health care provider tells you not to drink.  If you drink alcohol: ? Limit how much you have to 0-1 drink a day. ? Be aware of how much alcohol is in your drink. In the [place] one drink equals one 12 oz bottle of beer (355 mL), one 5 oz glass of wine (148 mL), or one 1 oz glass of hard liquor (44 mL). Lifestyle  Take daily care of your teeth and gums.  Stay active. Exercise for at least 30 minutes on 5 or more days each week.  Do not use any products that contain nicotine or tobacco, such as cigarettes, e-cigarettes, and chewing tobacco. If you need  help quitting, ask your health care provider.  If you are sexually active, practice safe sex. Use a condom or other form of protection in order to prevent STIs (sexually transmitted infections).  Talk with your health care provider about taking a low-dose aspirin or statin. What's next?  Go to your health care provider once a year for a well check visit.  Ask your health care provider how often you should have your eyes and teeth checked.  Stay up to date on all vaccines. This information is not intended to replace advice given to you by your health care provider. Make sure you discuss any questions you have with your health care provider. Document Revised: 10/13/2018 Document Reviewed: 10/13/2018 Elsevier Patient Education  2020 [employer].

## 2019-11-14 ENCOUNTER — Ambulatory Visit (INDEPENDENT_AMBULATORY_CARE_PROVIDER_SITE_OTHER): Payer: Medicare HMO

## 2019-11-14 DIAGNOSIS — J309 Allergic rhinitis, unspecified: Secondary | ICD-10-CM

## 2019-11-15 NOTE — Progress Notes (Signed)
VIAL EXP 11-14-20 

## 2019-11-16 DIAGNOSIS — J3081 Allergic rhinitis due to animal (cat) (dog) hair and dander: Secondary | ICD-10-CM | POA: Diagnosis not present

## 2019-11-20 ENCOUNTER — Ambulatory Visit (INDEPENDENT_AMBULATORY_CARE_PROVIDER_SITE_OTHER): Payer: Medicare HMO

## 2019-11-20 DIAGNOSIS — J309 Allergic rhinitis, unspecified: Secondary | ICD-10-CM

## 2019-11-26 ENCOUNTER — Other Ambulatory Visit: Payer: Self-pay | Admitting: Family Medicine

## 2019-11-27 ENCOUNTER — Other Ambulatory Visit: Payer: Self-pay | Admitting: Family Medicine

## 2019-11-28 ENCOUNTER — Ambulatory Visit (INDEPENDENT_AMBULATORY_CARE_PROVIDER_SITE_OTHER): Payer: Medicare HMO

## 2019-11-28 DIAGNOSIS — J309 Allergic rhinitis, unspecified: Secondary | ICD-10-CM

## 2019-12-04 ENCOUNTER — Ambulatory Visit (INDEPENDENT_AMBULATORY_CARE_PROVIDER_SITE_OTHER): Payer: Medicare HMO

## 2019-12-04 DIAGNOSIS — J309 Allergic rhinitis, unspecified: Secondary | ICD-10-CM

## 2019-12-11 ENCOUNTER — Ambulatory Visit (INDEPENDENT_AMBULATORY_CARE_PROVIDER_SITE_OTHER): Payer: Medicare HMO

## 2019-12-11 DIAGNOSIS — J309 Allergic rhinitis, unspecified: Secondary | ICD-10-CM | POA: Diagnosis not present

## 2019-12-19 ENCOUNTER — Ambulatory Visit (INDEPENDENT_AMBULATORY_CARE_PROVIDER_SITE_OTHER): Payer: Medicare HMO

## 2019-12-19 DIAGNOSIS — J309 Allergic rhinitis, unspecified: Secondary | ICD-10-CM

## 2019-12-22 IMAGING — MG DIGITAL SCREENING BILATERAL MAMMOGRAM WITH TOMO AND CAD
8 series · 8 of 24 positions shown · non-contrast
Comparison: Previous exam(s).

CLINICAL DATA: Screening.

EXAM:
DIGITAL SCREENING BILATERAL MAMMOGRAM WITH TOMO AND CAD

[R MLO synth-2D]
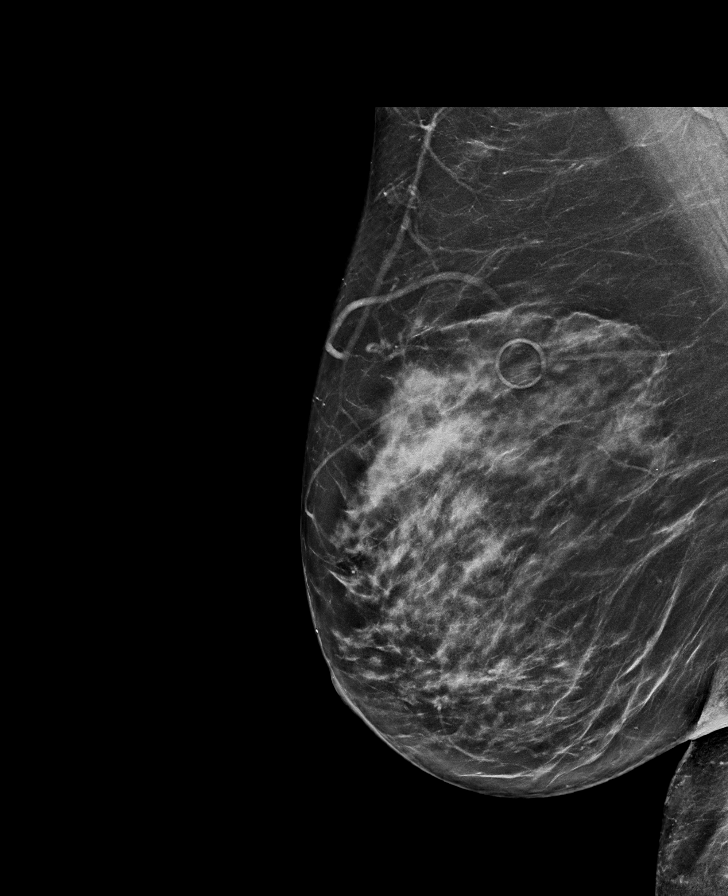

[L MLO synth-2D]
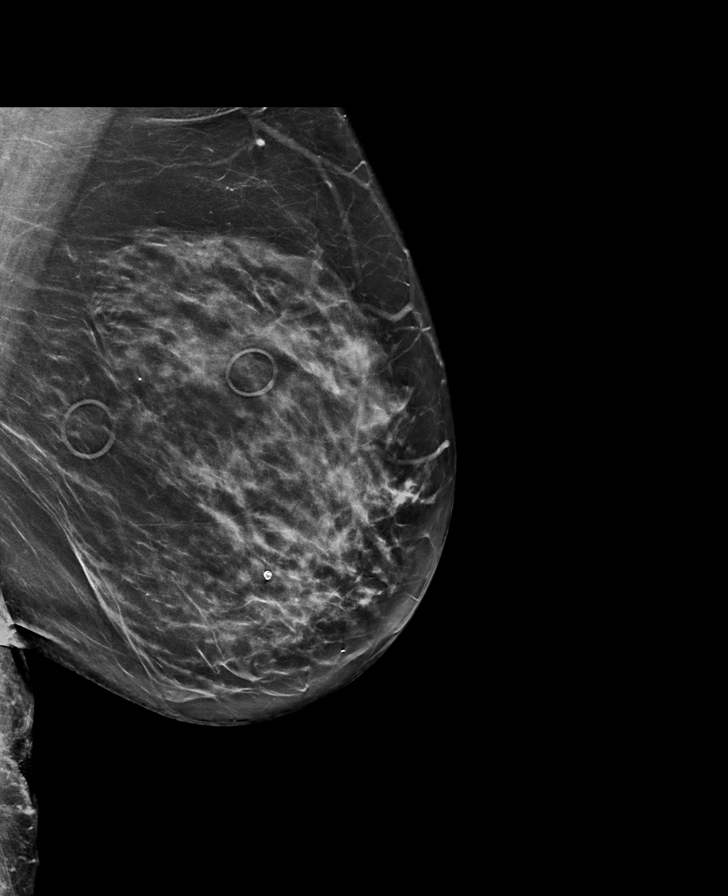

[L CC synth-2D]
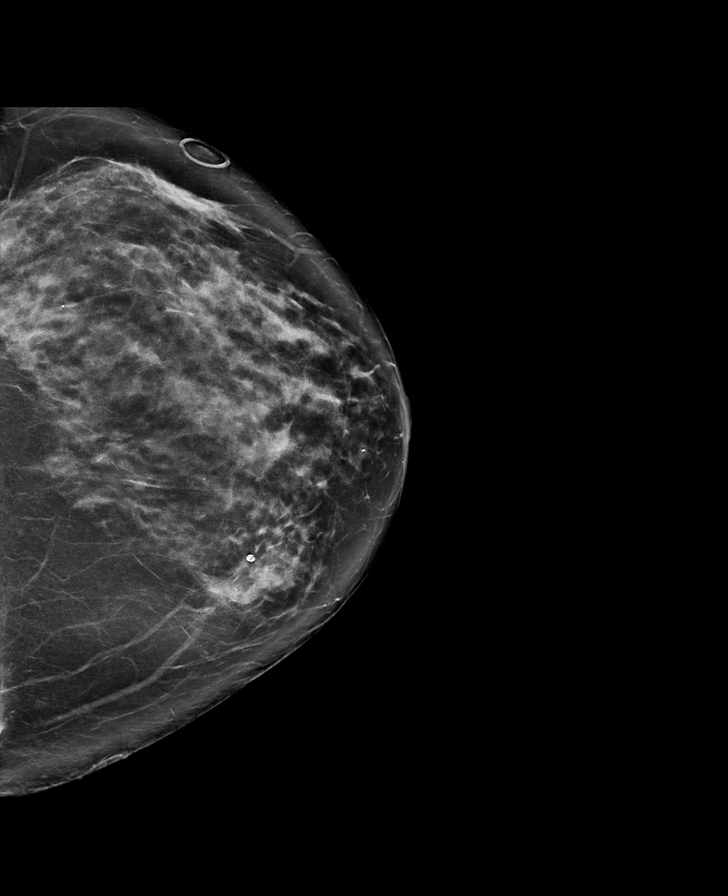

[R CC synth-2D]
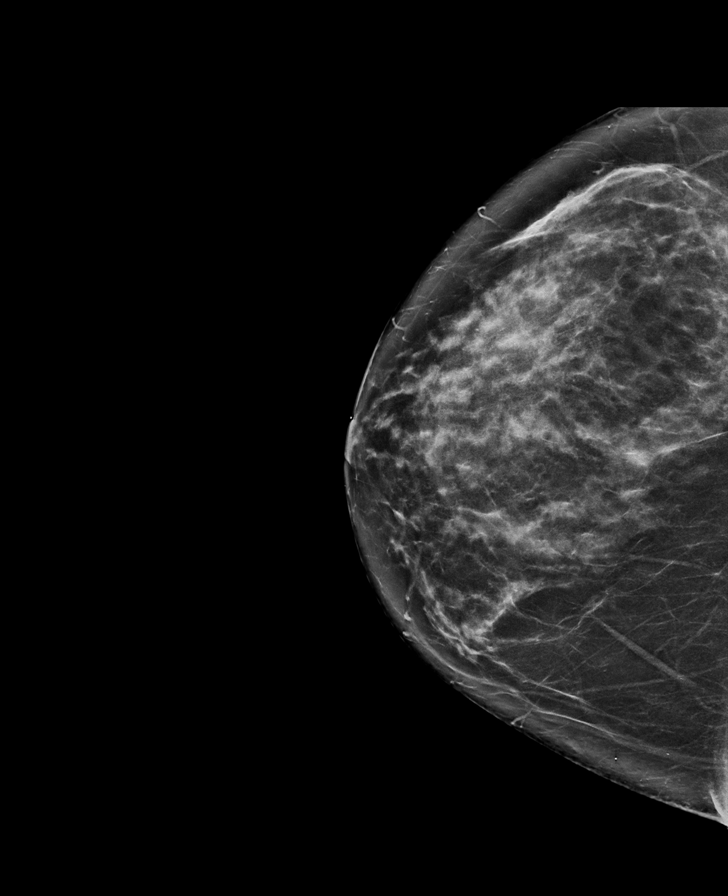

[R CC tomo · tomo slice 39/78.0]
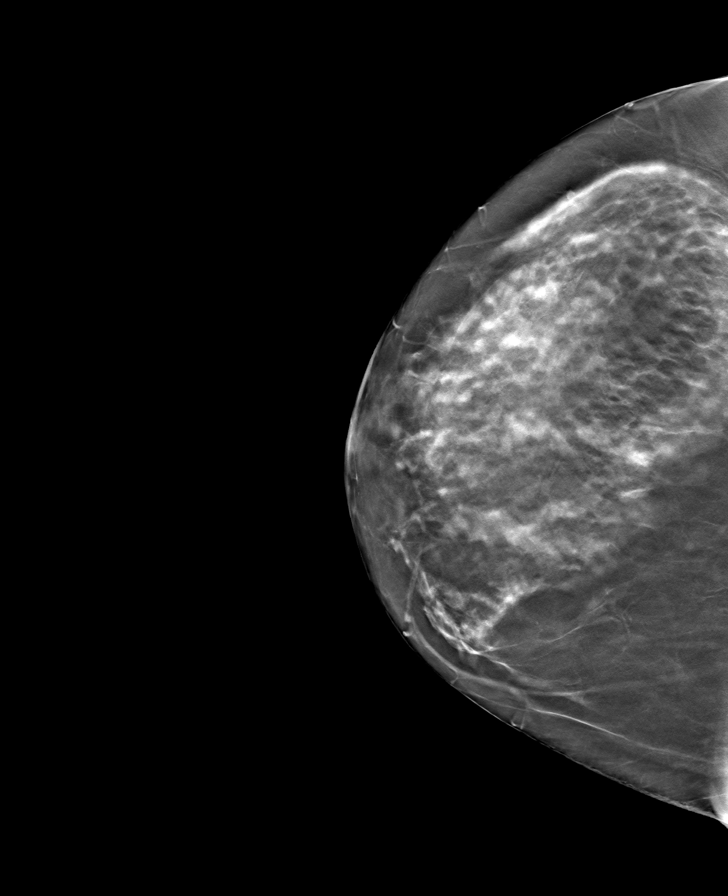

[L MLO tomo · tomo slice 44/87.0]
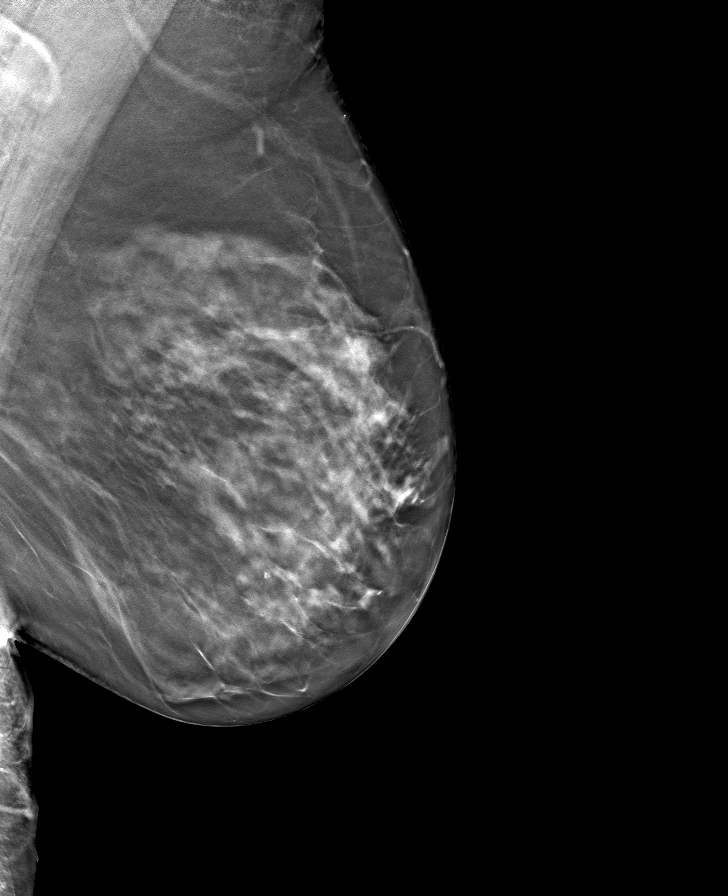

[R MLO tomo · tomo slice 41/82.0]
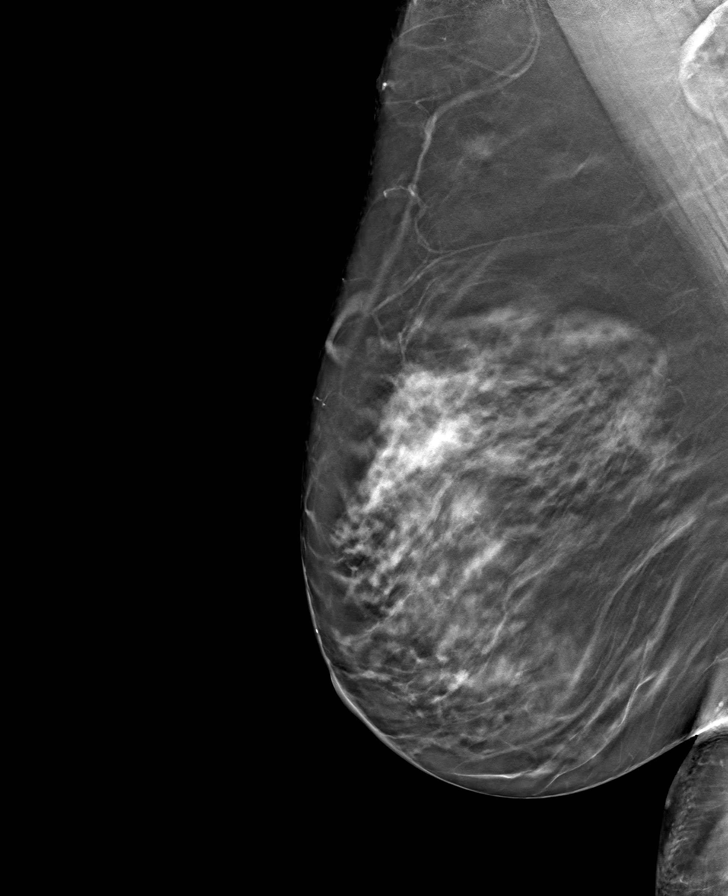

[L CC tomo · tomo slice 43/86.0]
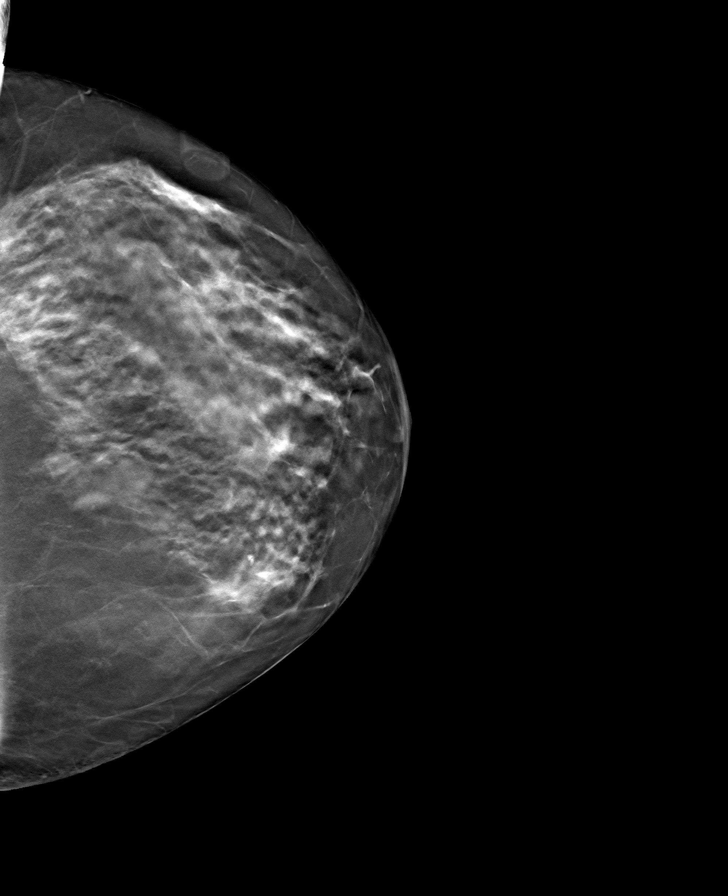

[8 of 24 positions shown; findings below may reference images not displayed]

ACR Breast Density Category c: The breast tissue is heterogeneously
dense, which may obscure small masses.
FINDINGS: There are no findings suspicious for malignancy. Images were
processed with CAD.
IMPRESSION: No mammographic evidence of malignancy. A result letter of this
screening mammogram will be mailed directly to the patient.

RECOMMENDATION:
Screening mammogram in one year. (Code:[id number])

BI-RADS CATEGORY  1: Negative.

## 2019-12-27 ENCOUNTER — Ambulatory Visit (INDEPENDENT_AMBULATORY_CARE_PROVIDER_SITE_OTHER): Payer: Medicare HMO

## 2019-12-27 DIAGNOSIS — J309 Allergic rhinitis, unspecified: Secondary | ICD-10-CM | POA: Diagnosis not present

## 2020-01-04 ENCOUNTER — Ambulatory Visit (INDEPENDENT_AMBULATORY_CARE_PROVIDER_SITE_OTHER): Payer: Medicare HMO

## 2020-01-04 DIAGNOSIS — J309 Allergic rhinitis, unspecified: Secondary | ICD-10-CM | POA: Diagnosis not present

## 2020-01-09 ENCOUNTER — Ambulatory Visit (INDEPENDENT_AMBULATORY_CARE_PROVIDER_SITE_OTHER): Payer: Medicare HMO

## 2020-01-09 DIAGNOSIS — J309 Allergic rhinitis, unspecified: Secondary | ICD-10-CM | POA: Diagnosis not present

## 2020-01-09 MED ORDER — LORATADINE 10 MG PO TABS: 10.0000 mg | ORAL_TABLET | Freq: Every day | ORAL | 0 refills | Status: DC

## 2020-01-16 ENCOUNTER — Other Ambulatory Visit: Payer: Self-pay

## 2020-01-16 ENCOUNTER — Encounter: Payer: Self-pay | Admitting: Pediatrics

## 2020-01-16 ENCOUNTER — Ambulatory Visit: Payer: Self-pay | Admitting: *Deleted

## 2020-01-16 ENCOUNTER — Ambulatory Visit: Payer: Medicare HMO | Admitting: Pediatrics

## 2020-01-16 VITALS — BP 134/80 | HR 78 | Temp 97.8°F | Resp 18 | Ht 67.0 in | Wt 197.2 lb

## 2020-01-16 DIAGNOSIS — J309 Allergic rhinitis, unspecified: Secondary | ICD-10-CM

## 2020-01-16 DIAGNOSIS — J4530 Mild persistent asthma, uncomplicated: Secondary | ICD-10-CM

## 2020-01-16 DIAGNOSIS — T7800XD Anaphylactic reaction due to unspecified food, subsequent encounter: Secondary | ICD-10-CM

## 2020-01-16 DIAGNOSIS — J3081 Allergic rhinitis due to animal (cat) (dog) hair and dander: Secondary | ICD-10-CM | POA: Diagnosis not present

## 2020-01-16 DIAGNOSIS — K219 Gastro-esophageal reflux disease without esophagitis: Secondary | ICD-10-CM

## 2020-01-16 MED ORDER — AZELASTINE HCL 0.1 % NA SOLN: NASAL | 5 refills | Status: DC

## 2020-01-16 NOTE — Patient Instructions (Signed)
Claritin 10 mg-take 1 tablet once a day for runny nose or itchy eyes Fluticasone 2 sprays per nostril once a day if needed for stuffy nose Azelastine 0.1% - 2 sprays per nostril twice a day if needed for runny nose or drainage or sinus headache Add prednisone 10 mg twice a day for 4 days, 10 mg on the fifth day to bring your allergic symptoms under control  Montelukast 10 mg-take 1 tablet once a day to prevent coughing or wheezing ProAir 2 puffs every 4 hours if needed for wheezing or coughing spells.  You may use ProAir 2 puffs 5 to 15 minutes before exercise  Zaditor 0.025% - 1 drop in each eye 3 times a day if needed for itchy eyes.  Avoid shellfish.  If you have an allergic reaction take Benadryl 50 mg every 4 hours and if you have  life-threatening symptoms inject with Auvi-Q 0.3 mg.  Schedule an appointment for allergy testing but remember to stop Claritin for 3 days before testing.  Do not take azelastine for 24 hours before testing

## 2020-01-16 NOTE — Progress Notes (Signed)
[address] [place] [address] Dept: [phone]  FOLLOW UP NOTE  Patient ID: Ashley Craig, female    DOB: [birth year]  Age: 69 y.o. MRN: [id number] Date of Office Visit: 01/16/2020  Assessment  Chief Complaint: Allergic Rhinitis  (eyes itchy and runny, ears feel full.), Asthma (cough, clearing throat.), and Nasal Congestion (runny nose)  HPI Ashley Craig presents for follow-up of allergic rhinitis, asthma and conjunctivitis.  Her asthma is well controlled.  Since she stopped her pollen allergy injections a few years ago , her symptoms have gotten worse.  She has continued to take allergy injections to  dog only.  She complains of a runny nose, stuffy nose, postnasal drainage and burning of her  eyes.  She could not tolerate Pazeo eyedrops because of dryness.  She is on Claritin 10 mg once a day, fluticasone 2 sprays per nostril once a day or instead azelastine 1 or 2 sprays per nostril twice a day.  Her asthma is well controlled by montelukast 10 mg once a day.  She continues to avoid shellfish.  She takes omeprazole 20 mg once a day if needed for heartburn   Drug Allergies:  Allergies  Allergen Reactions  . Other     Shellfish-Anaphylaxis  . Codeine     nausea  . Dog Epithelium     Physical Exam: BP 134/80 (BP Location: Left Arm, Patient Position: Sitting, Cuff Size: Normal)   Pulse 78   Temp 97.8 F (36.6 C) (Temporal)   Resp 18   Ht 5\' 7"  (1.702 m)   Wt 197 lb 3.2 oz (89.4 kg)   SpO2 97%   BMI 30.89 kg/m    Physical Exam Vitals reviewed.  Constitutional:      Appearance: Normal appearance. She is obese.  HENT:     Head:     Comments: Eyes normal.  Ears normal.  Nose mild swelling of nasal turbinates.  Pharynx normal. Cardiovascular:     Rate and Rhythm: Normal rate and regular rhythm.     Comments: S1-S2 normal no murmurs Pulmonary:     Comments: Clear to percussion and auscultation Musculoskeletal:     Cervical back: Neck supple.    Lymphadenopathy:     Cervical: No cervical adenopathy.  Neurological:     General: No focal deficit present.     Mental Status: She is alert and oriented to person, place, and time. Mental status is at baseline.  Psychiatric:        Mood and Affect: Mood normal.        Behavior: Behavior normal.        Thought Content: Thought content normal.        Judgment: Judgment normal.     Diagnostics: FVC  2.95 L FEV1 2.51 L.  Predicted FVC 3.50 L predicted FEV1 2.67 L-the spirometry is in the normal range  Assessment and Plan: 1. Mild persistent asthma without complication   2. Anaphylactic shock due to food, subsequent encounter   3. Gastroesophageal reflux disease without esophagitis   4. Allergic rhinitis due to animal hair and dander     Meds ordered this encounter  Medications  . azelastine (ASTELIN) 0.1 % nasal spray    Sig: TWO SPRAYS IN EACH NOSTRIL TWICE A DAY IF NEEDED    Dispense:  30 mL    Refill:  5    Patient Instructions  Claritin 10 mg-take 1 tablet once a day for runny nose or itchy eyes Fluticasone 2 sprays  per nostril once a day if needed for stuffy nose Azelastine 0.1% - 2 sprays per nostril twice a day if needed for runny nose or drainage or sinus headache Add prednisone 10 mg twice a day for 4 days, 10 mg on the fifth day to bring your allergic symptoms under control  Montelukast 10 mg-take 1 tablet once a day to prevent coughing or wheezing ProAir 2 puffs every 4 hours if needed for wheezing or coughing spells.  You may use ProAir 2 puffs 5 to 15 minutes before exercise  Zaditor 0.025% - 1 drop in each eye 3 times a day if needed for itchy eyes.  Avoid shellfish.  If you have an allergic reaction take Benadryl 50 mg every 4 hours and if you have  life-threatening symptoms inject with Auvi-Q 0.3 mg.  Schedule an appointment for allergy testing but remember to stop Claritin for 3 days before testing.  Do not take azelastine for 24 hours before  testing   No follow-ups on file.    Thank you for the opportunity to care for this patient.  Please do not hesitate to contact me with questions.  Penne Lash, M.D.  [facility] [address] [place], [place] [address] [phone]

## 2020-01-18 ENCOUNTER — Encounter: Payer: Self-pay | Admitting: Family Medicine

## 2020-01-22 ENCOUNTER — Encounter: Payer: Self-pay | Admitting: Family Medicine

## 2020-01-22 ENCOUNTER — Other Ambulatory Visit: Payer: Self-pay

## 2020-01-22 ENCOUNTER — Ambulatory Visit: Payer: Medicare HMO | Admitting: Family Medicine

## 2020-01-22 VITALS — BP 132/60 | HR 86 | Temp 98.0°F | Resp 18

## 2020-01-22 DIAGNOSIS — J4530 Mild persistent asthma, uncomplicated: Secondary | ICD-10-CM

## 2020-01-22 DIAGNOSIS — J301 Allergic rhinitis due to pollen: Secondary | ICD-10-CM

## 2020-01-22 DIAGNOSIS — T7800XD Anaphylactic reaction due to unspecified food, subsequent encounter: Secondary | ICD-10-CM

## 2020-01-22 DIAGNOSIS — H1010 Acute atopic conjunctivitis, unspecified eye: Secondary | ICD-10-CM

## 2020-01-22 NOTE — Patient Instructions (Addendum)
Environmental control of dust and mold and mold Claritin 10 mg-take 1 tablet once a day for runny nose or itchy eyes Fluticasone 2 sprays per nostril once a day if needed for stuffy nose Azelastine 0.1% - 2 sprays per nostril twice a day if needed for runny nose, drainage or sinus headache.  Montelukast 10 mg-take 1 tablet once a day to prevent coughing or wheezing ProAir 2 puffs every 4 hours if needed for wheezing or coughing spells.  You may use ProAir 2 puffs 5 to 15 minutes before exercise  Zaditor 0.025% - 1 drop in each eye 3 times a day if needed for itchy eyes  Avoid shellfish.  If you have an allergic reaction take Benadryl 50 mg every 4 hours and if you have life-threatening symptoms inject with Auvi-Q 0.3 mg  Continue  dog injections once a week. We will add an extra amount of dog allergen. In June after the severe allergy season, we will resume your injections to grass pollens and tree pollens in one  vial and weeds in  another vial .  You will start on weekly injections once a week to the pollens.  Once you reach maintenance in the weed pollen, we could include the dog in it .  Continue on  your other medications Call us if you are not doing well on this treatment plan

## 2020-01-22 NOTE — Progress Notes (Addendum)
[place] [address] Dept: [phone]  FOLLOW UP NOTE  Patient ID: Ashley Craig, female    DOB: 11-27-1950  Age: 69 y.o. MRN: [id number] Date of Office Visit: 01/22/2020  Assessment  Chief Complaint: Allergy Testing  HPI Ashley Craig presents for allergy testing.  She is on allergy injections to dog and her last dose was 0.5 cc of a red vial.  She did fine.  Several years ago she stopped her allergy injections to pollens.  Her allergic symptoms have gotten worse over the past year.  She continues to avoid shellfish and does not want to be retested to shellfish.   Drug Allergies:  Allergies  Allergen Reactions  . Other     Shellfish-Anaphylaxis  . Codeine     nausea  . Dog Epithelium     Physical Exam: BP 132/60 (BP Location: Right Arm, Patient Position: Sitting, Cuff Size: Normal)   Pulse 86   Temp 98 F (36.7 C) (Temporal)   Resp 18   SpO2 96%    Physical Exam Vitals reviewed.  Constitutional:      Appearance: Normal appearance. She is normal weight.  HENT:     Head:     Comments: Eyes normal.  Ears normal.  Nose normal.  Pharynx normal. Cardiovascular:     Rate and Rhythm: Normal rate and regular rhythm.     Comments: S1-S2 normal no murmurs Pulmonary:     Comments: Clear to percussion and auscultation Musculoskeletal:     Cervical back: Neck supple.  Lymphadenopathy:     Cervical: No cervical adenopathy.  Neurological:     General: No focal deficit present.     Mental Status: She is alert and oriented to person, place, and time.  Psychiatric:        Mood and Affect: Mood normal.        Behavior: Behavior normal.        Thought Content: Thought content normal.        Judgment: Judgment normal.     Diagnostics: Allergy skin test were positive to grass pollens, tree pollens and weeds.  She had mild reactivity to some molds and dog on intradermal testing only  Assessment and Plan: 1. Seasonal allergic rhinitis due to pollen     2. Mild persistent asthma without complication   3. Anaphylactic shock due to food, subsequent encounter   4. Seasonal allergic conjunctivitis     No orders of the defined types were placed in this encounter.   Patient Instructions  Environmental control of dust and mold and mold Claritin 10 mg-take 1 tablet once a day for runny nose or itchy eyes Fluticasone 2 sprays per nostril once a day if needed for stuffy nose Azelastine 0.1% - 2 sprays per nostril twice a day if needed for runny nose, drainage or sinus headache.  Montelukast 10 mg-take 1 tablet once a day to prevent coughing or wheezing ProAir 2 puffs every 4 hours if needed for wheezing or coughing spells.  You may use ProAir 2 puffs 5 to 15 minutes before exercise  Zaditor 0.025% - 1 drop in each eye 3 times a day if needed for itchy eyes  Avoid shellfish.  If you have an allergic reaction take Benadryl 50 mg every 4 hours and if you have life-threatening symptoms inject with Auvi-Q 0.3 mg  Continue  dog injections once a week. We will add an extra amount of dog allergen. In June after the severe allergy season,  we will resume your injections to grass pollens and tree pollens in one  vial and weeds in  another vial .  You will start on weekly injections once a week to the pollens.  Once you reach maintenance in the weed pollen, we could include the dog in it .  Continue on  your other medications Call us if you are not doing well on this treatment plan      Return in about 3 months (around 04/23/2020).    Thank you for the opportunity to care for this patient.  Please do not hesitate to contact me with questions.  Tonette Bihari, M.D.  [facility] [address] [place], [place] [address] [phone]

## 2020-01-24 NOTE — Addendum Note (Signed)
Addended by: Stephannie Li A on: 01/24/2020 03:14 PM   Modules accepted: Orders

## 2020-01-25 DIAGNOSIS — J3081 Allergic rhinitis due to animal (cat) (dog) hair and dander: Secondary | ICD-10-CM | POA: Diagnosis not present

## 2020-01-25 NOTE — Progress Notes (Signed)
New vials increase dog antigen.  Exp 01-24-21

## 2020-02-06 ENCOUNTER — Ambulatory Visit (INDEPENDENT_AMBULATORY_CARE_PROVIDER_SITE_OTHER): Payer: Medicare HMO

## 2020-02-06 DIAGNOSIS — J301 Allergic rhinitis due to pollen: Secondary | ICD-10-CM | POA: Diagnosis not present

## 2020-02-07 ENCOUNTER — Other Ambulatory Visit: Payer: Self-pay

## 2020-02-07 ENCOUNTER — Ambulatory Visit (INDEPENDENT_AMBULATORY_CARE_PROVIDER_SITE_OTHER): Payer: Medicare HMO | Admitting: Psychiatry

## 2020-02-07 ENCOUNTER — Encounter: Payer: Self-pay | Admitting: Psychiatry

## 2020-02-07 DIAGNOSIS — F32A Depression, unspecified: Secondary | ICD-10-CM

## 2020-02-07 DIAGNOSIS — F329 Major depressive disorder, single episode, unspecified: Secondary | ICD-10-CM | POA: Diagnosis not present

## 2020-02-07 DIAGNOSIS — G4700 Insomnia, unspecified: Secondary | ICD-10-CM

## 2020-02-07 DIAGNOSIS — F411 Generalized anxiety disorder: Secondary | ICD-10-CM

## 2020-02-07 MED ORDER — TEMAZEPAM 15 MG PO CAPS: ORAL_CAPSULE | ORAL | 2 refills | Status: DC

## 2020-02-07 MED ORDER — CLONAZEPAM 0.5 MG PO TABS: 0.5000 mg | ORAL_TABLET | Freq: Every day | ORAL | 2 refills | Status: DC | PRN

## 2020-02-07 NOTE — Progress Notes (Signed)
Ashley Craig [id number] May 20, 1951 69 y.o.  Subjective:   Patient ID:  Ashley Craig is a 69 y.o. (DOB April 18, 1951) female.  Chief Complaint:  Chief Complaint  Patient presents with  . Follow-up    h/o Anxiety, Depression    HPI KIRSTYN LEAN presents to the office today for follow-up of mod and anxiety. She reports that she has been doing well overall. She recently went to the beach and enjoyed this. Mood has been stable. Denies depressed mood. Reprts that her mood is typically better when she is busy. Reports occ "apathetic days when I put things off," typically on a cloudy day and typically does not last more than a day.Anxiety has been up and down, "depending on the day." Denies rumination. Occ restlessness.  Reports that she puts some things off, such as not completing the reading for Bible study tomorrow. Motivation is lower than she would like. Energy "comes and goes." Reports that she did extensive cooking before her beach trip and enjoyed this. Sleeping well. Appetite has been adequate. Reports that concentration "comes and goes." Denies anhedonia. Denies SI.   Has had both COVID vaccinations.   Planning to go to [place], [place] for [employer]. Bible study in person will resume tomorrow. Brother has cancer. Sister is dealing with grief and depression related to the loss of her daughter.   Has been taking Klonopin prn when she is worried about family or there are increased stressors. Estimates taking Klonopin about twice a week. Rarely taking Temazepam, usually when traveling   Past Psychiatric Medication Trials: Klonopin- Effective for anxiety. She reports that she has gone weeks without taking it and takes more of it during times of increased stress Lamictal- Has helped stabilize mood. Reports that she has been on higher and lower doses. Has taken at least one year.  Effexor XR- Severe discontinuation s/s.  Prozac Zoloft- Has taken long-term Temazepam- Has used  prn when traveling  PHQ2-9     Clinical Support from 11/10/2019 in [employer] at [employer]  PHQ-2 Total Score  0       Review of Systems:  Review of Systems  Eyes: Positive for itching.  Musculoskeletal: Positive for back pain. Negative for gait problem.       Had cortisone injection in knee.   Allergic/Immunologic: Positive for environmental allergies.  Neurological: Negative for tremors.  Psychiatric/Behavioral:       Please refer to HPI    Medications: I have reviewed the patient's current medications.  Current Outpatient Medications  Medication Sig Dispense Refill  . azelastine (ASTELIN) 0.1 % nasal spray TWO SPRAYS IN EACH NOSTRIL TWICE A DAY IF NEEDED 30 mL 5  . clonazePAM (KLONOPIN) 0.5 MG tablet Take 1 tablet (0.5 mg total) by mouth daily as needed for anxiety. 30 tablet 2  . fluticasone (FLONASE) 50 MCG/ACT nasal spray 2 sprays per nostril  once daily if needed  for stuffy nose. 48 g 3  . ibuprofen (ADVIL,MOTRIN) 200 MG tablet Take 400 mg by mouth 3 (three) times daily as needed. Reported on 11/05/2015    . loratadine (CLARITIN) 10 MG tablet TAKE 1 TABLET BY MOUTH EVERY DAY 90 tablet 1  . Melatonin 3 MG TABS Take 3 mg by mouth at bedtime. Reported on 11/05/2015    . montelukast (SINGULAIR) 10 MG tablet Take 1 tablet (10 mg total) by mouth at bedtime. 90 tablet 3  . omeprazole (PRILOSEC) 20 MG capsule TAKE 1 CAPSULE BY MOUTH  EVERY DAY (Patient taking differently: Take 20 mg by mouth daily as needed. ) 90 capsule 1  . perindopril (ACEON) 2 MG tablet TAKE 1 TABLET BY MOUTH EVERY DAY 90 tablet 3  . sertraline (ZOLOFT) 100 MG tablet Take 2 tablets (200 mg total) by mouth daily. 180 tablet 1  . simvastatin (ZOCOR) 20 MG tablet TAKE 1 TABLET BY MOUTH EVERY DAY 90 tablet 1  . temazepam (RESTORIL) 15 MG capsule Take 1-2 caps po QHS 30 capsule 2  . albuterol (PROVENTIL) (2.5 MG/3ML) 0.083% nebulizer solution Take 3 mLs (2.5 mg total) by nebulization every 6  (six) hours as needed for wheezing or shortness of breath. 75 mL 1  . albuterol (VENTOLIN HFA) 108 (90 Base) MCG/ACT inhaler Inhale 2 puffs into the lungs every 4 (four) hours as needed for wheezing or shortness of breath. 18 g 1  . CELESTONE SOLUSPAN 6 (3-3) MG/ML injection 2 CC CELESTONE VVO#[id number]    . clobetasol (TEMOVATE) 0.05 % external solution Apply 1 application topically 2 (two) times daily as needed.    . diclofenac sodium (VOLTAREN) 1 % GEL APPLY TO AFFECTED AREA 1-3 TIMES A DAY FOR 30 DAYS  1  . EPINEPHrine (AUVI-Q) 0.3 mg/0.3 mL IJ SOAJ injection Use as directed for severe allergic reactions 2 each 1  . lamoTRIgine (LAMICTAL) 150 MG tablet Take 2 tablets (300 mg total) by mouth daily. 180 tablet 1  . NON FORMULARY     . olopatadine (PATANOL) 0.1 % ophthalmic solution Place 1 drop into both eyes 2 (two) times daily. (Patient not taking: Reported on 02/07/2020) 5 mL 1  . Olopatadine HCl (PAZEO) 0.7 % SOLN Place 1 drop into both eyes daily as needed. (Patient not taking: Reported on 02/07/2020) 7.5 mL 3   No current facility-administered medications for this visit.    Medication Side Effects: None  Allergies:  Allergies  Allergen Reactions  . Other     Shellfish-Anaphylaxis  . Codeine     nausea  . Dog Epithelium     Past Medical History:  Diagnosis Date  . Anxiety    takes Zoloft daily;takes Clonazepam daily as needed  . Arthritis   . Chronic back pain    stenosis  . Diarrhea    occasionally  . GERD (gastroesophageal reflux disease)    takes Omeprazole daily   . History of blood clots 10 yrs ago   left calf after a bone break  . History of bronchitis 2014  . History of colon polyps    benign  . Hyperlipidemia    takes Simvastatin daily  . Hypertension   . Insomnia    takes Melatonin nightly  . Joint pain   . Seasonal allergies    takes Claritin daily;Nasonex and ProAir as needed;Singulair daily  . Weakness    numbness and tingling in right leg     Family History  Problem Relation Age of Onset  . Cancer Mother   . Retinitis pigmentosa Father   . Depression Father   . Anxiety disorder Sister   . Melanoma Brother   . Anxiety disorder Brother   . Anorexia nervosa Brother   . Leukemia Niece   . Anorexia nervosa Cousin   . Allergic rhinitis Neg Hx   . Angioedema Neg Hx   . Asthma Neg Hx   . Eczema Neg Hx   . Immunodeficiency Neg Hx   . Urticaria Neg Hx   . Breast cancer Neg Hx     Social History  Socioeconomic History  . Marital status: Single    Spouse name: Not on file  . Number of children: Not on file  . Years of education: Not on file  . Highest education level: Not on file  Occupational History  . Not on file  Tobacco Use  . Smoking status: Never Smoker  . Smokeless tobacco: Never Used  Substance and Sexual Activity  . Alcohol use: Yes    Comment: 1-2 glasses of wine daily  . Drug use: No  . Sexual activity: Not on file  Other Topics Concern  . Not on file  Social History Narrative  . Not on file   Social Determinants of Health   Financial Resource Strain:   . Difficulty of Paying Living Expenses:   Food Insecurity:   . Worried About Programme researcher, broadcasting/film/video in the Last Year:   . Barista in the Last Year:   Transportation Needs:   . Freight forwarder (Medical):   Marland Kitchen Lack of Transportation (Non-Medical):   Physical Activity:   . Days of Exercise per Week:   . Minutes of Exercise per Session:   Stress:   . Feeling of Stress :   Social Connections:   . Frequency of Communication with Friends and Family:   . Frequency of Social Gatherings with Friends and Family:   . Attends Religious Services:   . Active Member of Clubs or Organizations:   . Attends Banker Meetings:   Marland Kitchen Marital Status:   Intimate Partner Violence:   . Fear of Current or Ex-Partner:   . Emotionally Abused:   Marland Kitchen Physically Abused:   . Sexually Abused:     Past Medical History, Surgical history,  Social history, and Family history were reviewed and updated as appropriate.   Please see review of systems for further details on the patient's review from today.   Objective:   Physical Exam:  There were no vitals taken for this visit.  Physical Exam Constitutional:      General: She is not in acute distress. Musculoskeletal:        General: No deformity.  Neurological:     Mental Status: She is alert and oriented to person, place, and time.     Coordination: Coordination normal.  Psychiatric:        Attention and Perception: Attention and perception normal. She does not perceive auditory or visual hallucinations.        Mood and Affect: Mood normal. Mood is not anxious or depressed. Affect is not labile, blunt, angry or inappropriate.        Speech: Speech normal.        Behavior: Behavior normal.        Thought Content: Thought content normal. Thought content is not paranoid or delusional. Thought content does not include homicidal or suicidal ideation. Thought content does not include homicidal or suicidal plan.        Cognition and Memory: Cognition and memory normal.        Judgment: Judgment normal.     Comments: Insight intact     Lab Review:     Component Value Date/Time   NA 138 06/07/2019 1326   K 4.3 06/07/2019 1326   CL 104 06/07/2019 1326   CO2 27 06/07/2019 1326   GLUCOSE 99 06/07/2019 1326   BUN 20 06/07/2019 1326   CREATININE 0.71 06/07/2019 1326   CALCIUM 9.0 06/07/2019 1326   PROT 6.5 07/19/2019 1127   ALBUMIN  4.4 07/19/2019 1127   AST 17 07/19/2019 1127   ALT 21 07/19/2019 1127   ALKPHOS 124 (H) 07/19/2019 1127   BILITOT 0.4 07/19/2019 1127   GFRNONAA >90 10/03/2014 1027   GFRAA >90 10/03/2014 1027       Component Value Date/Time   WBC 5.3 12/07/2018 1007   RBC 4.98 12/07/2018 1007   HGB 14.5 12/07/2018 1007   HCT 43.3 12/07/2018 1007   PLT 291.0 12/07/2018 1007   MCV 86.9 12/07/2018 1007   MCH 29.2 10/03/2014 1027   MCHC 33.6 12/07/2018  1007   RDW 13.3 12/07/2018 1007    No results found for: POCLITH, LITHIUM   No results found for: PHENYTOIN, PHENOBARB, VALPROATE, CBMZ   .res Assessment: Plan:   Will continue current plan of care since target signs and symptoms are well controlled without any tolerability issues. Continue Lamictal 300 mg daily for mood signs and symptoms. Continue sertraline 200 mg daily for anxiety and depression. Continue Klonopin as needed for anxiety. Continue temazepam as needed for insomnia. Patient to follow-up in 3 months or sooner if clinically indicated. Patient advised to contact office with any questions, adverse effects, or acute worsening in signs and symptoms.  Isbella was seen today for follow-up.  Diagnoses and all orders for this visit:  Generalized anxiety disorder -     clonazePAM (KLONOPIN) 0.5 MG tablet; Take 1 tablet (0.5 mg total) by mouth daily as needed for anxiety.  Insomnia, unspecified type -     temazepam (RESTORIL) 15 MG capsule; Take 1-2 caps po QHS  Depression, unspecified depression type     Please see After Visit Summary for patient specific instructions.  Future Appointments  Date Time Provider Department Center  05/08/2020 11:00 AM Corie Chiquito, PMHNP [facility] None  11/11/2020  1:45 PM Glenna Durand, RN [facility]    No orders of the defined types were placed in this encounter.   -------------------------------

## 2020-02-13 ENCOUNTER — Ambulatory Visit (INDEPENDENT_AMBULATORY_CARE_PROVIDER_SITE_OTHER): Payer: Medicare HMO

## 2020-02-13 DIAGNOSIS — J309 Allergic rhinitis, unspecified: Secondary | ICD-10-CM

## 2020-02-14 NOTE — Patient Instructions (Addendum)
Great to see you again today- I will be in touch with your lab results Your got your tetanus and pneumonia boosters today Take care!

## 2020-02-14 NOTE — Progress Notes (Addendum)
[facility] [facility] [address], [place], [place] [address] [phone]  Date:  02/15/2020   Name:  Ashley Craig   DOB:  16-Apr-1951   MRN:  [id number]  PCP:  Darreld Mclean, MD    Chief Complaint: Hyperlipidemia (6 month follow up) and Immunizations (tetanus and pneumonia)   History of Present Illness:  Ashley Craig is a 69 y.o. very pleasant female patient who presents with the following:  Here today for a periodic follow-up visit History of allergies, GERD, vertigo, HTN, Fuch's corneal dystrophy She is getting allergy shots - this is just for her dog allergies, they have a pet dog and she is trying to tolerate this better.   Last seen by myself in September Mild elevation of alk phos, eval last year:  Gustavo Lah- your AMA test came back negative.        Results for orders placed or performed in visit on 07/31/19  Mitochondrial Antibodies  Result Value Ref Range   Mitochondrial M2 Ab, IgG < OR = 20.0 U   With your mild degree of alk phos elevation and normal ultrasound, this is likely a benign variant and we can keep an eye on it.  I would recommend monitoring your liver tests every 6 months for a while so we can make sure all is stable  Can give one more dose of pneumovax if she likes- would like to do today  Tetanus due this year - Td only .  She recently sustained a small wound on her right shin, will give booster covid series: done as of February    Overall she is feeling well, is trying to stay physically active.  She does not have any other concerns today  I went over her health maintenance and everything is up-to-date Patient Active Problem List   Diagnosis Date Noted  . Allergic rhinitis due to animal hair and dander 09/25/2019  . Anaphylactic shock due to adverse food reaction 05/11/2018  . Seasonal allergic conjunctivitis 05/09/2018  . Impingement syndrome of left ankle 07/12/2017  . Diarrhea  05/21/2017  . Gastroesophageal reflux disease with esophagitis 05/21/2017  . History of colon polyps 05/21/2017  . Orthostatic hypotension 04/08/2017  . Chronic pain of left ankle 03/30/2017  . Unilateral primary osteoarthritis, left knee 11/13/2016  . Allergic dermatitis 08/12/2016  . Chronic dryness of both eyes 06/02/2016  . Hypermetropia of both eyes 06/02/2016  . Open angle with borderline findings and low glaucoma risk in both eyes 06/02/2016  . Pinguecula of both eyes 06/02/2016  . Presbyopia of both eyes 06/02/2016  . Regular astigmatism of both eyes 06/02/2016  . Cellulitis of right lower extremity 01/30/2016  . Varicose veins of left lower extremity with pain 01/16/2016  . Mild persistent asthma without complication 28/78/6767  . Chronic venous insufficiency 08/07/2015  . Spontaneous hematoma of lower leg 11/30/2014  . Spinal stenosis of lumbar region 10/11/2014  . Allergic rhinitis 04/27/2014  . Adjustment disorder 04/27/2014  . Benign essential hypertension 04/27/2014  . Benign paroxysmal vertigo 04/27/2014  . Chondrocostal junction syndrome 04/27/2014  . Elevation of level of transaminase or lactic acid dehydrogenase (LDH) 04/27/2014  . 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 Overweight 04/27/2014  . Spontaneous ecchymosis 04/27/2014  . Tendonitis of shoulder 04/27/2014  . Gastroesophageal reflux disease 03/27/2014  . Anxiety 03/27/2014  . Environmental allergies 03/27/2014  . Hypercholesteremia 03/27/2014  . Insomnia 03/27/2014  . Vitamin D  deficiency 03/27/2014    Past Medical History:  Diagnosis Date  . Anxiety    takes Zoloft daily;takes Clonazepam daily as needed  . Arthritis   . Chronic back pain    stenosis  . Diarrhea    occasionally  . GERD (gastroesophageal reflux disease)    takes Omeprazole daily   . History of blood clots 10 yrs ago   left calf after a bone break  . History of bronchitis 2014  . History of colon polyps    benign  .  Hyperlipidemia    takes Simvastatin daily  . Hypertension   . Insomnia    takes Melatonin nightly  . Joint pain   . Seasonal allergies    takes Claritin daily;Nasonex and ProAir as needed;Singulair daily  . Weakness    numbness and tingling in right leg    Past Surgical History:  Procedure Laterality Date  . ANAL FISSURE REPAIR    . ANKLE SURGERY Left 09/2017  . colonosocpy    . D&C of bladder  47yr ago  . ESOPHAGOGASTRODUODENOSCOPY     with ED  . TONSILLECTOMY    . WRIST SURGERY Right    with plate    Social History   Tobacco Use  . Smoking status: Never Smoker  . Smokeless tobacco: Never Used  Substance Use Topics  . Alcohol use: Yes    Comment: 1-2 glasses of wine daily  . Drug use: No    Family History  Problem Relation Age of Onset  . Cancer Mother   . Retinitis pigmentosa Father   . Depression Father   . Anxiety disorder Sister   . Melanoma Brother   . Anxiety disorder Brother   . Anorexia nervosa Brother   . Leukemia Niece   . Anorexia nervosa Cousin   . Allergic rhinitis Neg Hx   . Angioedema Neg Hx   . Asthma Neg Hx   . Eczema Neg Hx   . Immunodeficiency Neg Hx   . Urticaria Neg Hx   . Breast cancer Neg Hx     Allergies  Allergen Reactions  . Other     Shellfish-Anaphylaxis  . Codeine     nausea  . Dog Epithelium     Medication list has been reviewed and updated.  Current Outpatient Medications on File Prior to Visit  Medication Sig Dispense Refill  . albuterol (PROVENTIL) (2.5 MG/3ML) 0.083% nebulizer solution Take 3 mLs (2.5 mg total) by nebulization every 6 (six) hours as needed for wheezing or shortness of breath. 75 mL 1  . albuterol (VENTOLIN HFA) 108 (90 Base) MCG/ACT inhaler Inhale 2 puffs into the lungs every 4 (four) hours as needed for wheezing or shortness of breath. 18 g 1  . azelastine (ASTELIN) 0.1 % nasal spray TWO SPRAYS IN EACH NOSTRIL TWICE A DAY IF NEEDED 30 mL 5  . CELESTONE SOLUSPAN 6 (3-3) MG/ML injection 2 CC  CELESTONE LQIO#[id number]   . clobetasol (TEMOVATE) 0.05 % external solution Apply 1 application topically 2 (two) times daily as needed.    . clonazePAM (KLONOPIN) 0.5 MG tablet Take 1 tablet (0.5 mg total) by mouth daily as needed for anxiety. 30 tablet 2  . diclofenac sodium (VOLTAREN) 1 % GEL APPLY TO AFFECTED AREA 1-3 TIMES A DAY FOR 30 DAYS  1  . EPINEPHrine (AUVI-Q) 0.3 mg/0.3 mL IJ SOAJ injection Use as directed for severe allergic reactions 2 each 1  . fluticasone (FLONASE) 50 MCG/ACT nasal spray 2 sprays per  nostril  once daily if needed  for stuffy nose. 48 g 3  . ibuprofen (ADVIL,MOTRIN) 200 MG tablet Take 400 mg by mouth 3 (three) times daily as needed. Reported on 11/05/2015    . loratadine (CLARITIN) 10 MG tablet TAKE 1 TABLET BY MOUTH EVERY DAY 90 tablet 1  . Melatonin 3 MG TABS Take 3 mg by mouth at bedtime. Reported on 11/05/2015    . montelukast (SINGULAIR) 10 MG tablet Take 1 tablet (10 mg total) by mouth at bedtime. 90 tablet 3  . NON FORMULARY     . olopatadine (PATANOL) 0.1 % ophthalmic solution Place 1 drop into both eyes 2 (two) times daily. 5 mL 1  . Olopatadine HCl (PAZEO) 0.7 % SOLN Place 1 drop into both eyes daily as needed. 7.5 mL 3  . omeprazole (PRILOSEC) 20 MG capsule TAKE 1 CAPSULE BY MOUTH EVERY DAY (Patient taking differently: Take 20 mg by mouth daily as needed. ) 90 capsule 1  . perindopril (ACEON) 2 MG tablet TAKE 1 TABLET BY MOUTH EVERY DAY 90 tablet 3  . sertraline (ZOLOFT) 100 MG tablet Take 2 tablets (200 mg total) by mouth daily. 180 tablet 1  . simvastatin (ZOCOR) 20 MG tablet TAKE 1 TABLET BY MOUTH EVERY DAY 90 tablet 1  . temazepam (RESTORIL) 15 MG capsule Take 1-2 caps po QHS 30 capsule 2  . lamoTRIgine (LAMICTAL) 150 MG tablet Take 2 tablets (300 mg total) by mouth daily. 180 tablet 1   No current facility-administered medications on file prior to visit.    Review of Systems:  As per HPI- otherwise negative.   Physical Examination: Vitals:    02/15/20 1605 02/15/20 1619  BP: (!) 151/89 130/85  Pulse: 78   Resp: 17   Temp: 97.7 F (36.5 C)   SpO2: 97%    Vitals:   02/15/20 1605  Weight: 199 lb (90.3 kg)  Height: '5\' 7"'  (1.702 m)   Body mass index is 31.17 kg/m. Ideal Body Weight: Weight in (lb) to have BMI = 25: 159.3  GEN: no acute distress.  Overweight, otherwise looks well HEENT: Atraumatic, Normocephalic.   Bilateral TM wnl, oropharynx normal.  PEERL,EOMI.   Ears and Nose: No external deformity. CV: RRR, No M/G/R. No JVD. No thrill. No extra heart sounds. PULM: CTA B, no wheezes, crackles, rhonchi. No retractions. No resp. distress. No accessory muscle use. ABD: S, NT, ND, +BS. No rebound. No HSM. EXTR: No c/c/e PSYCH: Normally interactive. Conversant.  Small wound on right lower extremity, this is healing and does not need sutures  Assessment and Plan: Elevated alkaline phosphatase level - Plan: Comprehensive metabolic panel  Hyperlipidemia, unspecified hyperlipidemia type - Plan: Lipid panel  Essential hypertension - Plan: CBC, Comprehensive metabolic panel  Screening for deficiency anemia - Plan: CBC  Screening for diabetes mellitus - Plan: Hemoglobin A1c  Immunization due - Plan: Pneumococcal polysaccharide vaccine 23-valent greater than or equal to 2yo subcutaneous/IM  Wound of right lower extremity, initial encounter - Plan: Td vaccine greater than or equal to 7yo preservative free IM  Point up today on routine labs and health maintenance Blood work pending as above Hypertension under adequate control currently without medication Multiple allergies are treated by her allergist I will be in touch with her results as soon as possible Updated immunizations today Moderate medical decision making  This visit occurred during the SARS-CoV-2 public health emergency.  Safety protocols were in place, including screening questions prior to the visit, additional usage  of staff PPE, and extensive cleaning of  exam room while observing appropriate contact time as indicated for disinfecting solutions.     Signed Lamar Blinks, MD  Addendum 4/16, received her labs as below.  Message to patient  Results for orders placed or performed in visit on 02/15/20  CBC  Result Value Ref Range   WBC 7.5 4.0 - 10.5 K/uL   RBC 4.52 3.87 - 5.11 Mil/uL   Platelets 272.0 150.0 - 400.0 K/uL   Hemoglobin 13.3 12.0 - 15.0 g/dL   HCT 39.5 36.0 - 46.0 %   MCV 87.4 78.0 - 100.0 fl   MCHC 33.8 30.0 - 36.0 g/dL   RDW 13.2 11.5 - 15.5 %  Comprehensive metabolic panel  Result Value Ref Range   Sodium 137 135 - 145 mEq/L   Potassium 4.2 3.5 - 5.1 mEq/L   Chloride 104 96 - 112 mEq/L   CO2 26 19 - 32 mEq/L   Glucose, Bld 94 70 - 99 mg/dL   BUN 14 6 - 23 mg/dL   Creatinine, Ser 0.78 0.40 - 1.20 mg/dL   Total Bilirubin 0.3 0.2 - 1.2 mg/dL   Alkaline Phosphatase 121 (H) 39 - 117 U/L   AST 17 0 - 37 U/L   ALT 18 0 - 35 U/L   Total Protein 6.5 6.0 - 8.3 g/dL   Albumin 4.3 3.5 - 5.2 g/dL   GFR 73.77 >60.00 mL/min   Calcium 8.4 8.4 - 10.5 mg/dL  Hemoglobin A1c  Result Value Ref Range   Hgb A1c MFr Bld 5.4 4.6 - 6.5 %  Lipid panel  Result Value Ref Range   Cholesterol 238 (H) 0 - 200 mg/dL   Triglycerides 166.0 (H) 0.0 - 149.0 mg/dL   HDL 87.80 >39.00 mg/dL   VLDL 33.2 0.0 - 40.0 mg/dL   LDL Cholesterol 117 (H) 0 - 99 mg/dL   Total CHOL/HDL Ratio 3    NonHDL 150.31

## 2020-02-15 ENCOUNTER — Other Ambulatory Visit: Payer: Self-pay

## 2020-02-15 ENCOUNTER — Encounter: Payer: Self-pay | Admitting: Family Medicine

## 2020-02-15 ENCOUNTER — Ambulatory Visit (INDEPENDENT_AMBULATORY_CARE_PROVIDER_SITE_OTHER): Payer: Medicare HMO | Admitting: Family Medicine

## 2020-02-15 VITALS — BP 130/85 | HR 78 | Temp 97.7°F | Resp 17 | Ht 67.0 in | Wt 199.0 lb

## 2020-02-15 DIAGNOSIS — Z23 Encounter for immunization: Secondary | ICD-10-CM

## 2020-02-15 DIAGNOSIS — Z130 Encounter for screening for diseases of the blood and blood-forming organs and certain disorders involving the immune mechanism: Secondary | ICD-10-CM

## 2020-02-15 DIAGNOSIS — S81801A Unspecified open wound, right lower leg, initial encounter: Secondary | ICD-10-CM

## 2020-02-15 DIAGNOSIS — R748 Abnormal levels of other serum enzymes: Secondary | ICD-10-CM | POA: Diagnosis not present

## 2020-02-15 DIAGNOSIS — I10 Essential (primary) hypertension: Secondary | ICD-10-CM

## 2020-02-15 DIAGNOSIS — E785 Hyperlipidemia, unspecified: Secondary | ICD-10-CM

## 2020-02-15 DIAGNOSIS — Z131 Encounter for screening for diabetes mellitus: Secondary | ICD-10-CM | POA: Diagnosis not present

## 2020-02-16 ENCOUNTER — Encounter: Payer: Self-pay | Admitting: Family Medicine

## 2020-02-16 LAB — LIPID PANEL
Cholesterol: 238 mg/dL — ABNORMAL HIGH (ref 0–200)
HDL: 87.8 mg/dL (ref >39.00)
LDL Cholesterol: 117 mg/dL — ABNORMAL HIGH (ref 0–99)
NonHDL: 150.31
Total CHOL/HDL Ratio: 3
Triglycerides: 166 mg/dL — ABNORMAL HIGH (ref 0.0–149.0)
VLDL: 33.2 mg/dL (ref 0.0–40.0)

## 2020-02-16 LAB — CBC
HCT: 39.5 % (ref 36.0–46.0)
Hemoglobin: 13.3 g/dL (ref 12.0–15.0)
MCHC: 33.8 g/dL (ref 30.0–36.0)
MCV: 87.4 fl (ref 78.0–100.0)
Platelets: 272 10*3/uL (ref 150.0–400.0)
RBC: 4.52 Mil/uL (ref 3.87–5.11)
RDW: 13.2 % (ref 11.5–15.5)
WBC: 7.5 10*3/uL (ref 4.0–10.5)

## 2020-02-16 LAB — COMPREHENSIVE METABOLIC PANEL
ALT: 18 U/L (ref 0–35)
AST: 17 U/L (ref 0–37)
Albumin: 4.3 g/dL (ref 3.5–5.2)
Alkaline Phosphatase: 121 U/L — ABNORMAL HIGH (ref 39–117)
BUN: 14 mg/dL (ref 6–23)
CO2: 26 mEq/L (ref 19–32)
Calcium: 8.4 mg/dL (ref 8.4–10.5)
Chloride: 104 mEq/L (ref 96–112)
Creatinine, Ser: 0.78 mg/dL (ref 0.40–1.20)
GFR: 73.77 mL/min (ref >60.00)
Glucose, Bld: 94 mg/dL (ref 70–99)
Potassium: 4.2 mEq/L (ref 3.5–5.1)
Sodium: 137 mEq/L (ref 135–145)
Total Bilirubin: 0.3 mg/dL (ref 0.2–1.2)
Total Protein: 6.5 g/dL (ref 6.0–8.3)

## 2020-02-16 LAB — HEMOGLOBIN A1C: Hgb A1c MFr Bld: 5.4 % (ref 4.6–6.5)

## 2020-02-19 MED ORDER — SIMVASTATIN 40 MG PO TABS: 40.0000 mg | ORAL_TABLET | Freq: Every day | ORAL | 3 refills | Status: DC

## 2020-02-20 ENCOUNTER — Ambulatory Visit (INDEPENDENT_AMBULATORY_CARE_PROVIDER_SITE_OTHER): Payer: Medicare HMO | Admitting: *Deleted

## 2020-02-20 DIAGNOSIS — J309 Allergic rhinitis, unspecified: Secondary | ICD-10-CM | POA: Diagnosis not present

## 2020-02-21 ENCOUNTER — Ambulatory Visit: Payer: Self-pay | Admitting: Family Medicine

## 2020-02-27 ENCOUNTER — Ambulatory Visit (INDEPENDENT_AMBULATORY_CARE_PROVIDER_SITE_OTHER): Payer: Medicare HMO

## 2020-02-27 DIAGNOSIS — J309 Allergic rhinitis, unspecified: Secondary | ICD-10-CM | POA: Diagnosis not present

## 2020-03-05 ENCOUNTER — Ambulatory Visit (INDEPENDENT_AMBULATORY_CARE_PROVIDER_SITE_OTHER): Payer: Medicare HMO

## 2020-03-05 DIAGNOSIS — J309 Allergic rhinitis, unspecified: Secondary | ICD-10-CM | POA: Diagnosis not present

## 2020-03-11 ENCOUNTER — Ambulatory Visit (INDEPENDENT_AMBULATORY_CARE_PROVIDER_SITE_OTHER): Payer: Medicare HMO

## 2020-03-11 DIAGNOSIS — J309 Allergic rhinitis, unspecified: Secondary | ICD-10-CM

## 2020-03-18 ENCOUNTER — Ambulatory Visit (INDEPENDENT_AMBULATORY_CARE_PROVIDER_SITE_OTHER): Payer: Medicare HMO

## 2020-03-18 DIAGNOSIS — J309 Allergic rhinitis, unspecified: Secondary | ICD-10-CM

## 2020-03-25 NOTE — Addendum Note (Signed)
Addended by: Stephannie Li A on: 03/25/2020 04:41 PM   Modules accepted: Orders

## 2020-03-26 ENCOUNTER — Ambulatory Visit (INDEPENDENT_AMBULATORY_CARE_PROVIDER_SITE_OTHER): Payer: Medicare HMO

## 2020-03-26 DIAGNOSIS — J309 Allergic rhinitis, unspecified: Secondary | ICD-10-CM

## 2020-03-26 NOTE — Progress Notes (Signed)
VIALS EXP 03-26-21 

## 2020-03-27 DIAGNOSIS — J301 Allergic rhinitis due to pollen: Secondary | ICD-10-CM

## 2020-03-28 DIAGNOSIS — J3081 Allergic rhinitis due to animal (cat) (dog) hair and dander: Secondary | ICD-10-CM | POA: Diagnosis not present

## 2020-04-02 ENCOUNTER — Ambulatory Visit (INDEPENDENT_AMBULATORY_CARE_PROVIDER_SITE_OTHER): Payer: Medicare HMO

## 2020-04-02 ENCOUNTER — Other Ambulatory Visit: Payer: Self-pay

## 2020-04-02 DIAGNOSIS — J309 Allergic rhinitis, unspecified: Secondary | ICD-10-CM

## 2020-04-02 NOTE — Progress Notes (Signed)
Immunotherapy   Patient Details  Name: Ashley Craig MRN: [id number] Date of Birth: August 11, 1951  04/02/2020  Ashley Craig Patient restarted ITX.  New remix now on 2 vials.  [employer] Following schedule: A  Frequency:ONCE A WEEK Epi-Pen:YES Consent signed and patient instructions given.   Ashley Craig Ashley Craig 04/02/2020, 11:37 AM

## 2020-04-09 ENCOUNTER — Ambulatory Visit (INDEPENDENT_AMBULATORY_CARE_PROVIDER_SITE_OTHER): Payer: Medicare HMO

## 2020-04-09 DIAGNOSIS — J309 Allergic rhinitis, unspecified: Secondary | ICD-10-CM | POA: Diagnosis not present

## 2020-04-21 ENCOUNTER — Other Ambulatory Visit: Payer: Self-pay | Admitting: Psychiatry

## 2020-04-21 DIAGNOSIS — F32A Depression, unspecified: Secondary | ICD-10-CM

## 2020-04-22 ENCOUNTER — Ambulatory Visit (INDEPENDENT_AMBULATORY_CARE_PROVIDER_SITE_OTHER): Payer: Medicare HMO

## 2020-04-22 DIAGNOSIS — J309 Allergic rhinitis, unspecified: Secondary | ICD-10-CM

## 2020-04-30 ENCOUNTER — Ambulatory Visit (INDEPENDENT_AMBULATORY_CARE_PROVIDER_SITE_OTHER): Payer: Medicare HMO

## 2020-04-30 DIAGNOSIS — J309 Allergic rhinitis, unspecified: Secondary | ICD-10-CM | POA: Diagnosis not present

## 2020-05-08 ENCOUNTER — Ambulatory Visit: Payer: Medicare HMO | Admitting: Psychiatry

## 2020-05-09 ENCOUNTER — Ambulatory Visit (INDEPENDENT_AMBULATORY_CARE_PROVIDER_SITE_OTHER): Payer: Medicare HMO

## 2020-05-09 DIAGNOSIS — J309 Allergic rhinitis, unspecified: Secondary | ICD-10-CM

## 2020-05-15 ENCOUNTER — Other Ambulatory Visit: Payer: Self-pay

## 2020-05-15 ENCOUNTER — Encounter: Payer: Self-pay | Admitting: Psychiatry

## 2020-05-15 ENCOUNTER — Other Ambulatory Visit: Payer: Self-pay | Admitting: Psychiatry

## 2020-05-15 ENCOUNTER — Ambulatory Visit (INDEPENDENT_AMBULATORY_CARE_PROVIDER_SITE_OTHER): Payer: Medicare HMO | Admitting: Psychiatry

## 2020-05-15 DIAGNOSIS — F411 Generalized anxiety disorder: Secondary | ICD-10-CM

## 2020-05-15 DIAGNOSIS — G4700 Insomnia, unspecified: Secondary | ICD-10-CM | POA: Diagnosis not present

## 2020-05-15 DIAGNOSIS — F32A Depression, unspecified: Secondary | ICD-10-CM

## 2020-05-15 DIAGNOSIS — F329 Major depressive disorder, single episode, unspecified: Secondary | ICD-10-CM | POA: Diagnosis not present

## 2020-05-15 MED ORDER — SERTRALINE HCL 100 MG PO TABS: 200.0000 mg | ORAL_TABLET | Freq: Every day | ORAL | 1 refills | Status: DC

## 2020-05-15 MED ORDER — LAMOTRIGINE 150 MG PO TABS: 300.0000 mg | ORAL_TABLET | Freq: Every day | ORAL | 1 refills | Status: DC

## 2020-05-15 NOTE — Progress Notes (Signed)
Ashley Craig [id number] 06-29-1951 69 y.o.  Subjective:   Patient ID:  Ashley Craig is a 68 y.o. (DOB [birth year]) female.  Chief Complaint:  Chief Complaint  Patient presents with  . Anxiety    HPI Ashley Craig presents to the office today for follow-up of anxiety and mood disturbance.  Her brother's cancer is spreading and reports that she has been "an emotional wreck." She eports that a friend that she has worked with died suddenly and this caused her distress for 2 weeks.   She reports that she has been awakening with severe anxiety "and as the day goes on I feel better." Denies physical s/s with anxiety.  She reports that she also has had recent visual changes and had to see opthamologist emergently. She reports that she has recently been noticing age related changes and this has had a negative effect on her self-image. Denies catastropohic thinking. She reports that anxiety is better when she is busy or around others.   "I'm not depressed, but I am not not-depressed." Reports that she has noticed some excessive guilt, such as feeling guilty about leaving her dog at home. Denies irritability. Sleeping well. Appetite is fair and is eating regularly. She reports that she has not been eating as healthy. Has been procrastinating and putting off some tasks, such as ironing. Has been paying bills right before they are due and usually would pay them immediately. Motivation has been lower. Motivation is good when she has a project or deadline. She reports that her concentration has been impaired. Has mailed a bill without the check. Has been reading more and read 2 books in one week. Will sometimes have to refer back when she reads. Denies impulsive or risky behavior. Denies SI.   Sister also had abnormality in her lung that is thought to be non-cancerous.   Taking Temazepam prn insomnia. Uses klonopin prn less than daily.   Past Psychiatric Medication Trials: Klonopin- Effective  for anxiety. She reports that she has gone weeks without taking it and takes more of it during times of increased stress Lamictal- Has helped stabilize mood. Reports that she has been on higher and lower doses. Has taken at least one year.  Effexor XR- Severe discontinuation s/s.  Prozac Zoloft- Has taken long-term Temazepam- Has used prn when traveling   PHQ2-9     Clinical Support from 11/10/2019 in [employer] at [employer]  PHQ-2 Total Score 0       Review of Systems:  Review of Systems  Eyes: Positive for visual disturbance.       Floaters  Musculoskeletal: Negative for gait problem.  Neurological: Positive for tremors.       Chronic hand tremor.   Psychiatric/Behavioral:       Please refer to HPI    Medications: I have reviewed the patient's current medications.  Current Outpatient Medications  Medication Sig Dispense Refill  . albuterol (PROVENTIL) (2.5 MG/3ML) 0.083% nebulizer solution Take 3 mLs (2.5 mg total) by nebulization every 6 (six) hours as needed for wheezing or shortness of breath. 75 mL 1  . albuterol (VENTOLIN HFA) 108 (90 Base) MCG/ACT inhaler Inhale 2 puffs into the lungs every 4 (four) hours as needed for wheezing or shortness of breath. 18 g 1  . azelastine (ASTELIN) 0.1 % nasal spray TWO SPRAYS IN EACH NOSTRIL TWICE A DAY IF NEEDED 30 mL 5  . CELESTONE SOLUSPAN 6 (3-3) MG/ML injection 2 CC CELESTONE TSV#[id number]    .  clobetasol (TEMOVATE) 0.05 % external solution Apply 1 application topically 2 (two) times daily as needed.    . clonazePAM (KLONOPIN) 0.5 MG tablet Take 1 tablet (0.5 mg total) by mouth daily as needed for anxiety. 30 tablet 2  . diclofenac sodium (VOLTAREN) 1 % GEL APPLY TO AFFECTED AREA 1-3 TIMES A DAY FOR 30 DAYS  1  . EPINEPHrine (AUVI-Q) 0.3 mg/0.3 mL IJ SOAJ injection Use as directed for severe allergic reactions 2 each 1  . fluticasone (FLONASE) 50 MCG/ACT nasal spray 2 sprays per nostril  once daily if  needed  for stuffy nose. 48 g 3  . ibuprofen (ADVIL,MOTRIN) 200 MG tablet Take 400 mg by mouth 3 (three) times daily as needed. Reported on 11/05/2015    . lamoTRIgine (LAMICTAL) 150 MG tablet Take 2 tablets (300 mg total) by mouth daily. 180 tablet 1  . loratadine (CLARITIN) 10 MG tablet TAKE 1 TABLET BY MOUTH EVERY DAY 90 tablet 1  . Melatonin 3 MG TABS Take 3 mg by mouth at bedtime. Reported on 11/05/2015    . montelukast (SINGULAIR) 10 MG tablet Take 1 tablet (10 mg total) by mouth at bedtime. 90 tablet 3  . sertraline (ZOLOFT) 100 MG tablet Take 2 tablets (200 mg total) by mouth daily. 180 tablet 1  . temazepam (RESTORIL) 15 MG capsule Take 1-2 caps po QHS 30 capsule 2  . NON FORMULARY     . olopatadine (PATANOL) 0.1 % ophthalmic solution Place 1 drop into both eyes 2 (two) times daily. 5 mL 1  . Olopatadine HCl (PAZEO) 0.7 % SOLN Place 1 drop into both eyes daily as needed. 7.5 mL 3  . omeprazole (PRILOSEC) 20 MG capsule TAKE 1 CAPSULE BY MOUTH EVERY DAY (Patient taking differently: Take 20 mg by mouth daily as needed. ) 90 capsule 1  . perindopril (ACEON) 2 MG tablet TAKE 1 TABLET BY MOUTH EVERY DAY 90 tablet 3  . simvastatin (ZOCOR) 40 MG tablet Take 1 tablet (40 mg total) by mouth at bedtime. 90 tablet 3   No current facility-administered medications for this visit.    Medication Side Effects: None  Allergies:  Allergies  Allergen Reactions  . Other     Shellfish-Anaphylaxis  . Codeine     nausea  . Dog Epithelium     Past Medical History:  Diagnosis Date  . Anxiety    takes Zoloft daily;takes Clonazepam daily as needed  . Arthritis   . Chronic back pain    stenosis  . Diarrhea    occasionally  . GERD (gastroesophageal reflux disease)    takes Omeprazole daily   . History of blood clots 10 yrs ago   left calf after a bone break  . History of bronchitis 2014  . History of colon polyps    benign  . Hyperlipidemia    takes Simvastatin daily  . Hypertension   .  Insomnia    takes Melatonin nightly  . Joint pain   . Seasonal allergies    takes Claritin daily;Nasonex and ProAir as needed;Singulair daily  . Weakness    numbness and tingling in right leg    Family History  Problem Relation Age of Onset  . Cancer Mother   . Retinitis pigmentosa Father   . Depression Father   . Anxiety disorder Sister   . Melanoma Brother   . Anxiety disorder Brother   . Anorexia nervosa Brother   . Leukemia Niece   . Anorexia nervosa Cousin   .  Allergic rhinitis Neg Hx   . Angioedema Neg Hx   . Asthma Neg Hx   . Eczema Neg Hx   . Immunodeficiency Neg Hx   . Urticaria Neg Hx   . Breast cancer Neg Hx     Social History   Socioeconomic History  . Marital status: Single    Spouse name: Not on file  . Number of children: Not on file  . Years of education: Not on file  . Highest education level: Not on file  Occupational History  . Not on file  Tobacco Use  . Smoking status: Never Smoker  . Smokeless tobacco: Never Used  Vaping Use  . Vaping Use: Never used  Substance and Sexual Activity  . Alcohol use: Yes    Comment: 1-2 glasses of wine daily  . Drug use: No  . Sexual activity: Not on file  Other Topics Concern  . Not on file  Social History Narrative  . Not on file   Social Determinants of Health   Financial Resource Strain:   . Difficulty of Paying Living Expenses:   Food Insecurity:   . Worried About Programme researcher, broadcasting/film/video in the Last Year:   . Barista in the Last Year:   Transportation Needs:   . Freight forwarder (Medical):   Marland Kitchen Lack of Transportation (Non-Medical):   Physical Activity:   . Days of Exercise per Week:   . Minutes of Exercise per Session:   Stress:   . Feeling of Stress :   Social Connections:   . Frequency of Communication with Friends and Family:   . Frequency of Social Gatherings with Friends and Family:   . Attends Religious Services:   . Active Member of Clubs or Organizations:   . Attends  Banker Meetings:   Marland Kitchen Marital Status:   Intimate Partner Violence:   . Fear of Current or Ex-Partner:   . Emotionally Abused:   Marland Kitchen Physically Abused:   . Sexually Abused:     Past Medical History, Surgical history, Social history, and Family history were reviewed and updated as appropriate.   Please see review of systems for further details on the patient's review from today.   Objective:   Physical Exam:  There were no vitals taken for this visit.  Physical Exam Constitutional:      General: She is not in acute distress. Musculoskeletal:        General: No deformity.  Neurological:     Mental Status: She is alert and oriented to person, place, and time.     Coordination: Coordination normal.  Psychiatric:        Attention and Perception: Attention and perception normal. She does not perceive auditory or visual hallucinations.        Mood and Affect: Mood is anxious. Mood is not depressed. Affect is not labile, blunt, angry or inappropriate.        Speech: Speech normal.        Behavior: Behavior normal.        Thought Content: Thought content normal. Thought content is not paranoid or delusional. Thought content does not include homicidal or suicidal ideation. Thought content does not include homicidal or suicidal plan.        Cognition and Memory: Cognition and memory normal.        Judgment: Judgment normal.     Comments: Insight intact     Lab Review:     Component Value Date/Time  NA 137 02/15/2020 1624   K 4.2 02/15/2020 1624   CL 104 02/15/2020 1624   CO2 26 02/15/2020 1624   GLUCOSE 94 02/15/2020 1624   BUN 14 02/15/2020 1624   CREATININE 0.78 02/15/2020 1624   CALCIUM 8.4 02/15/2020 1624   PROT 6.5 02/15/2020 1624   ALBUMIN 4.3 02/15/2020 1624   AST 17 02/15/2020 1624   ALT 18 02/15/2020 1624   ALKPHOS 121 (H) 02/15/2020 1624   BILITOT 0.3 02/15/2020 1624   GFRNONAA >90 10/03/2014 1027   GFRAA >90 10/03/2014 1027       Component Value  Date/Time   WBC 7.5 02/15/2020 1624   RBC 4.52 02/15/2020 1624   HGB 13.3 02/15/2020 1624   HCT 39.5 02/15/2020 1624   PLT 272.0 02/15/2020 1624   MCV 87.4 02/15/2020 1624   MCH 29.2 10/03/2014 1027   MCHC 33.8 02/15/2020 1624   RDW 13.2 02/15/2020 1624    No results found for: POCLITH, LITHIUM   No results found for: PHENYTOIN, PHENOBARB, VALPROATE, CBMZ   .res Assessment: Plan:   Discussed treatment plan to include continuing current plan of care or making changes to medication. Pt reports that she would prefer to continue current medications since anxiety seems to be directly related to acute stressors. Discussed that she can use Klonopin prn for acute situational anxiety.  Continue Sertraline 200 mg po qd for mood and anxiety.  Continue Lamictal 300 mg po qd for mood s/s. Continue Temazepam 15 mg 1-2 capsules po QHS for insomnia.  Pt to f/u in 3 months or sooner if clinically indicated.  Patient advised to contact office with any questions, adverse effects, or acute worsening in signs and symptoms.  Darel HongJudy was seen today for anxiety.  Diagnoses and all orders for this visit:  Generalized anxiety disorder -     sertraline (ZOLOFT) 100 MG tablet; Take 2 tablets (200 mg total) by mouth daily.  Depression, unspecified depression type -     sertraline (ZOLOFT) 100 MG tablet; Take 2 tablets (200 mg total) by mouth daily. -     lamoTRIgine (LAMICTAL) 150 MG tablet; Take 2 tablets (300 mg total) by mouth daily.  Insomnia, unspecified type     Please see After Visit Summary for patient specific instructions.  Future Appointments  Date Time Provider Department Center  08/15/2020 11:00 AM Corie Chiquitoarter, Reann Dobias, PMHNP [facility] None  11/11/2020  1:45 PM Glenna DurandBritt, Victoria A, RN [facility]    No orders of the defined types were placed in this encounter.   -------------------------------

## 2020-05-16 ENCOUNTER — Ambulatory Visit (INDEPENDENT_AMBULATORY_CARE_PROVIDER_SITE_OTHER): Payer: Medicare HMO

## 2020-05-16 DIAGNOSIS — J309 Allergic rhinitis, unspecified: Secondary | ICD-10-CM | POA: Diagnosis not present

## 2020-05-22 ENCOUNTER — Ambulatory Visit (INDEPENDENT_AMBULATORY_CARE_PROVIDER_SITE_OTHER): Payer: Medicare HMO

## 2020-05-22 DIAGNOSIS — J309 Allergic rhinitis, unspecified: Secondary | ICD-10-CM | POA: Diagnosis not present

## 2020-05-28 ENCOUNTER — Ambulatory Visit (INDEPENDENT_AMBULATORY_CARE_PROVIDER_SITE_OTHER): Payer: Medicare HMO

## 2020-05-28 DIAGNOSIS — J309 Allergic rhinitis, unspecified: Secondary | ICD-10-CM

## 2020-06-03 ENCOUNTER — Encounter: Payer: Self-pay | Admitting: Family Medicine

## 2020-06-04 ENCOUNTER — Ambulatory Visit (INDEPENDENT_AMBULATORY_CARE_PROVIDER_SITE_OTHER): Payer: Medicare HMO

## 2020-06-04 DIAGNOSIS — J309 Allergic rhinitis, unspecified: Secondary | ICD-10-CM | POA: Diagnosis not present

## 2020-06-10 ENCOUNTER — Other Ambulatory Visit: Payer: Self-pay | Admitting: Family Medicine

## 2020-06-10 DIAGNOSIS — Z1231 Encounter for screening mammogram for malignant neoplasm of breast: Secondary | ICD-10-CM

## 2020-06-12 ENCOUNTER — Ambulatory Visit (INDEPENDENT_AMBULATORY_CARE_PROVIDER_SITE_OTHER): Payer: Medicare HMO | Admitting: *Deleted

## 2020-06-12 DIAGNOSIS — J309 Allergic rhinitis, unspecified: Secondary | ICD-10-CM

## 2020-06-17 ENCOUNTER — Ambulatory Visit (INDEPENDENT_AMBULATORY_CARE_PROVIDER_SITE_OTHER): Payer: Medicare HMO

## 2020-06-17 DIAGNOSIS — J309 Allergic rhinitis, unspecified: Secondary | ICD-10-CM | POA: Diagnosis not present

## 2020-06-24 ENCOUNTER — Ambulatory Visit (INDEPENDENT_AMBULATORY_CARE_PROVIDER_SITE_OTHER): Payer: Medicare HMO

## 2020-06-24 DIAGNOSIS — J309 Allergic rhinitis, unspecified: Secondary | ICD-10-CM | POA: Diagnosis not present

## 2020-06-27 ENCOUNTER — Other Ambulatory Visit: Payer: Self-pay | Admitting: Family Medicine

## 2020-07-02 ENCOUNTER — Ambulatory Visit (INDEPENDENT_AMBULATORY_CARE_PROVIDER_SITE_OTHER): Payer: Medicare HMO

## 2020-07-02 DIAGNOSIS — J309 Allergic rhinitis, unspecified: Secondary | ICD-10-CM | POA: Diagnosis not present

## 2020-07-09 ENCOUNTER — Ambulatory Visit (INDEPENDENT_AMBULATORY_CARE_PROVIDER_SITE_OTHER): Payer: Medicare HMO

## 2020-07-09 DIAGNOSIS — J309 Allergic rhinitis, unspecified: Secondary | ICD-10-CM | POA: Diagnosis not present

## 2020-07-15 ENCOUNTER — Other Ambulatory Visit: Payer: Self-pay

## 2020-07-15 ENCOUNTER — Ambulatory Visit
Admission: RE | Admit: 2020-07-15 | Discharge: 2020-07-15 | Disposition: A | Discharge status: Home / Self Care | Payer: Medicare HMO | Source: Ambulatory Visit | Attending: Family Medicine | Admitting: Family Medicine

## 2020-07-15 DIAGNOSIS — Z1231 Encounter for screening mammogram for malignant neoplasm of breast: Secondary | ICD-10-CM

## 2020-07-16 ENCOUNTER — Ambulatory Visit (INDEPENDENT_AMBULATORY_CARE_PROVIDER_SITE_OTHER): Payer: Medicare HMO

## 2020-07-16 DIAGNOSIS — J309 Allergic rhinitis, unspecified: Secondary | ICD-10-CM

## 2020-07-22 ENCOUNTER — Ambulatory Visit (INDEPENDENT_AMBULATORY_CARE_PROVIDER_SITE_OTHER): Payer: Medicare HMO | Admitting: *Deleted

## 2020-07-22 DIAGNOSIS — J309 Allergic rhinitis, unspecified: Secondary | ICD-10-CM | POA: Diagnosis not present

## 2020-07-29 ENCOUNTER — Ambulatory Visit (INDEPENDENT_AMBULATORY_CARE_PROVIDER_SITE_OTHER): Payer: Medicare HMO | Admitting: *Deleted

## 2020-07-29 DIAGNOSIS — J309 Allergic rhinitis, unspecified: Secondary | ICD-10-CM

## 2020-08-07 ENCOUNTER — Ambulatory Visit (INDEPENDENT_AMBULATORY_CARE_PROVIDER_SITE_OTHER): Payer: Medicare HMO

## 2020-08-07 DIAGNOSIS — J309 Allergic rhinitis, unspecified: Secondary | ICD-10-CM | POA: Diagnosis not present

## 2020-08-08 ENCOUNTER — Other Ambulatory Visit: Payer: Self-pay | Admitting: Psychiatry

## 2020-08-08 DIAGNOSIS — F411 Generalized anxiety disorder: Secondary | ICD-10-CM

## 2020-08-09 NOTE — Telephone Encounter (Signed)
Apt 10/14 

## 2020-08-12 ENCOUNTER — Ambulatory Visit (INDEPENDENT_AMBULATORY_CARE_PROVIDER_SITE_OTHER): Payer: Medicare HMO

## 2020-08-12 DIAGNOSIS — J309 Allergic rhinitis, unspecified: Secondary | ICD-10-CM | POA: Diagnosis not present

## 2020-08-15 ENCOUNTER — Ambulatory Visit: Payer: Medicare HMO | Admitting: Psychiatry

## 2020-08-20 ENCOUNTER — Ambulatory Visit (INDEPENDENT_AMBULATORY_CARE_PROVIDER_SITE_OTHER): Payer: Medicare HMO

## 2020-08-20 DIAGNOSIS — J309 Allergic rhinitis, unspecified: Secondary | ICD-10-CM

## 2020-08-26 ENCOUNTER — Ambulatory Visit (INDEPENDENT_AMBULATORY_CARE_PROVIDER_SITE_OTHER): Payer: Medicare HMO | Admitting: *Deleted

## 2020-08-26 DIAGNOSIS — J309 Allergic rhinitis, unspecified: Secondary | ICD-10-CM | POA: Diagnosis not present

## 2020-08-29 ENCOUNTER — Other Ambulatory Visit: Payer: Self-pay | Admitting: Psychiatry

## 2020-08-29 DIAGNOSIS — F32A Depression, unspecified: Secondary | ICD-10-CM

## 2020-08-29 DIAGNOSIS — F411 Generalized anxiety disorder: Secondary | ICD-10-CM

## 2020-09-02 ENCOUNTER — Encounter: Payer: Self-pay | Admitting: Psychiatry

## 2020-09-02 ENCOUNTER — Ambulatory Visit: Payer: Medicare HMO | Admitting: Psychiatry

## 2020-09-02 ENCOUNTER — Ambulatory Visit (INDEPENDENT_AMBULATORY_CARE_PROVIDER_SITE_OTHER): Payer: Medicare HMO | Admitting: Psychiatry

## 2020-09-02 ENCOUNTER — Other Ambulatory Visit: Payer: Self-pay

## 2020-09-02 DIAGNOSIS — F32A Depression, unspecified: Secondary | ICD-10-CM

## 2020-09-02 DIAGNOSIS — F411 Generalized anxiety disorder: Secondary | ICD-10-CM | POA: Diagnosis not present

## 2020-09-02 DIAGNOSIS — G4700 Insomnia, unspecified: Secondary | ICD-10-CM | POA: Diagnosis not present

## 2020-09-02 MED ORDER — CLONAZEPAM 0.5 MG PO TABS: ORAL_TABLET | ORAL | 5 refills | Status: DC

## 2020-09-02 MED ORDER — SERTRALINE HCL 100 MG PO TABS: 200.0000 mg | ORAL_TABLET | Freq: Every day | ORAL | 0 refills | Status: DC

## 2020-09-02 MED ORDER — TEMAZEPAM 15 MG PO CAPS: ORAL_CAPSULE | ORAL | 2 refills | Status: DC

## 2020-09-02 NOTE — Progress Notes (Signed)
Ashley NajjarJudy V Craig [id number] 06/19/1951 69 y.o.  Subjective:   Patient ID:  Ashley NajjarJudy V Craig is a 69 y.o. (DOB 09/18/1951) female.  Chief Complaint:  Chief Complaint  Patient presents with   Follow-up    h/o anxiety and depression   Patient is being seen today for follow-up of anxiety and history of mood disturbance.  She reports that she has been having some anxiety and apathy most days upon awakening.  She reports that it can occasionally last all day but typically resolves in the morning. She reports that she has difficulty being alone. She reports that her mood and anxiety improve once she is around others. She reports that she frequently has to initiate contact and plans with others. Denies excessive worry. She reports that she will procrastinate and avoid doing things, like chores and paying bills. She reports that she will take care of things before the deadline. She reports that her energy is lower than it used to be and does not have the same energy and endurance after surgery 5 years ago. She reports that her motivation is ok. She reports that she has someone help her clean her house. She has sleeping well with Melatonin. Appetite has been ok. Concentration is ok. Denies SI.   Denies panic attacks.   Her brother has been receiving positive feedback re: his cancer. She has been dealing with back pain.   Past Psychiatric Medication Trials: Klonopin- Effective for anxiety. She reports that she has gone weeks without taking it and takes more of it during times of increased stress Lamictal- Has helped stabilize mood. Reports that she has been on higher and lower doses. Has taken at least one year.  Effexor XR- Severe discontinuation s/s.  Prozac Zoloft- Has taken long-term Temazepam- Has used prn when traveling  PHQ2-9     Clinical Support [facility]10/[facility]  PHQ-2 Total Score 0       Review of Systems:  Review of Systems   Musculoskeletal: Positive for arthralgias and back pain. Negative for gait problem.       Has had spinal issue and has had injections and may have a nerve block. Has been having episodic TMJ. Knee pain  Skin: Negative for rash.  Neurological: Positive for headaches. Negative for tremors.  Psychiatric/Behavioral:       Please refer to HPI    Medications: I have reviewed the patient's current medications.  Current Outpatient Medications  Medication Sig Dispense Refill   albuterol (PROVENTIL) (2.5 MG/3ML) 0.083% nebulizer solution Take 3 mLs (2.5 mg total) by nebulization every 6 (six) hours as needed for wheezing or shortness of breath. 75 mL 1   albuterol (VENTOLIN HFA) 108 (90 Base) MCG/ACT inhaler INHALE 2 PUFFS INTO THE LUNGS EVERY 4 (FOUR) HOURS AS NEEDED FOR WHEEZING OR SHORTNESS OF BREATH. 18 g 0   azelastine (ASTELIN) 0.1 % nasal spray TWO SPRAYS IN EACH NOSTRIL TWICE A DAY IF NEEDED 30 mL 5   CELESTONE SOLUSPAN 6 (3-3) MG/ML injection 2 CC CELESTONE WUJ#[id number]#[id number]     clobetasol (TEMOVATE) 0.05 % external solution Apply 1 application topically 2 (two) times daily as needed.     [START ON 09/07/2020] clonazePAM (KLONOPIN) 0.5 MG tablet TAKE 1 TABLET BY MOUTH EVERY DAY AS NEEDED FOR ANXIETY 30 tablet 5   diclofenac sodium (VOLTAREN) 1 % GEL APPLY TO AFFECTED AREA 1-3 TIMES A DAY FOR 30 DAYS  1   EPINEPHrine (AUVI-Q) 0.3 mg/0.3 mL  IJ SOAJ injection Use as directed for severe allergic reactions 2 each 1   fluticasone (FLONASE) 50 MCG/ACT nasal spray 2 sprays per nostril  once daily if needed  for stuffy nose. 48 g 3   ibuprofen (ADVIL,MOTRIN) 200 MG tablet Take 400 mg by mouth 3 (three) times daily as needed. Reported on 11/05/2015     loratadine (CLARITIN) 10 MG tablet TAKE 1 TABLET BY MOUTH EVERY DAY 90 tablet 1   Melatonin 3 MG TABS Take 3 mg by mouth at bedtime. Reported on 11/05/2015     montelukast (SINGULAIR) 10 MG tablet Take 1 tablet (10 mg total) by mouth at bedtime. 90  tablet 3   NON FORMULARY      olopatadine (PATANOL) 0.1 % ophthalmic solution Place 1 drop into both eyes 2 (two) times daily. 5 mL 1   Olopatadine HCl (PAZEO) 0.7 % SOLN Place 1 drop into both eyes daily as needed. 7.5 mL 3   omeprazole (PRILOSEC) 20 MG capsule TAKE 1 CAPSULE BY MOUTH EVERY DAY (Patient taking differently: Take 20 mg by mouth daily as needed. ) 90 capsule 1   perindopril (ACEON) 2 MG tablet TAKE 1 TABLET BY MOUTH EVERY DAY 90 tablet 3   sertraline (ZOLOFT) 100 MG tablet Take 2 tablets (200 mg total) by mouth daily. 180 tablet 0   simvastatin (ZOCOR) 40 MG tablet Take 1 tablet (40 mg total) by mouth at bedtime. 90 tablet 3   temazepam (RESTORIL) 15 MG capsule Take 1-2 caps po QHS 30 capsule 2   lamoTRIgine (LAMICTAL) 150 MG tablet Take 2 tablets (300 mg total) by mouth daily. 180 tablet 1   No current facility-administered medications for this visit.    Medication Side Effects: None  Allergies:  Allergies  Allergen Reactions   Other     Shellfish-Anaphylaxis   Codeine     nausea   Dog Epithelium     Past Medical History:  Diagnosis Date   Anxiety    takes Zoloft daily;takes Clonazepam daily as needed   Arthritis    Chronic back pain    stenosis   Diarrhea    occasionally   GERD (gastroesophageal reflux disease)    takes Omeprazole daily    History of blood clots 10 yrs ago   left calf after a bone break   History of bronchitis 2014   History of colon polyps    benign   Hyperlipidemia    takes Simvastatin daily   Hypertension    Insomnia    takes Melatonin nightly   Joint pain    Seasonal allergies    takes Claritin daily;Nasonex and ProAir as needed;Singulair daily   Weakness    numbness and tingling in right leg    Family History  Problem Relation Age of Onset   Cancer Mother    Retinitis pigmentosa Father    Depression Father    Anxiety disorder Sister    Melanoma Brother    Anxiety disorder Brother     Anorexia nervosa Brother    Leukemia Niece    Anorexia nervosa Cousin    Allergic rhinitis Neg Hx    Angioedema Neg Hx    Asthma Neg Hx    Eczema Neg Hx    Immunodeficiency Neg Hx    Urticaria Neg Hx    Breast cancer Neg Hx     Social History   Socioeconomic History   Marital status: Single    Spouse name: Not on file   Number  of children: Not on file   Years of education: Not on file   Highest education level: Not on file  Occupational History   Not on file  Tobacco Use   Smoking status: Never Smoker   Smokeless tobacco: Never Used  Vaping Use   Vaping Use: Never used  Substance and Sexual Activity   Alcohol use: Yes    Comment: 1-2 glasses of wine daily   Drug use: No   Sexual activity: Not on file  Other Topics Concern   Not on file  Social History Narrative   Not on file   Social Determinants of Health   Financial Resource Strain:    Difficulty of Paying Living Expenses: Not on file  Food Insecurity:    Worried About Running Out of Food in the Last Year: Not on file   Ran Out of Food in the Last Year: Not on file  Transportation Needs:    Lack of Transportation (Medical): Not on file   Lack of Transportation (Non-Medical): Not on file  Physical Activity:    Days of Exercise per Week: Not on file   Minutes of Exercise per Session: Not on file  Stress:    Feeling of Stress : Not on file  Social Connections:    Frequency of Communication with Friends and Family: Not on file   Frequency of Social Gatherings with Friends and Family: Not on file   Attends Religious Services: Not on file   Active Member of Clubs or Organizations: Not on file   Attends Banker Meetings: Not on file   Marital Status: Not on file  Intimate Partner Violence:    Fear of Current or Ex-Partner: Not on file   Emotionally Abused: Not on file   Physically Abused: Not on file   Sexually Abused: Not on file    Past Medical  History, Surgical history, Social history, and Family history were reviewed and updated as appropriate.   Please see review of systems for further details on the patient's review from today.   Objective:   Physical Exam:  There were no vitals taken for this visit.  Physical Exam Constitutional:      General: She is not in acute distress. Musculoskeletal:        General: No deformity.  Neurological:     Mental Status: She is alert and oriented to person, place, and time.     Coordination: Coordination normal.  Psychiatric:        Attention and Perception: Attention and perception normal. She does not perceive auditory or visual hallucinations.        Mood and Affect: Mood normal. Mood is not anxious or depressed. Affect is not labile, blunt, angry or inappropriate.        Speech: Speech normal.        Behavior: Behavior normal.        Thought Content: Thought content normal. Thought content is not paranoid or delusional. Thought content does not include homicidal or suicidal ideation. Thought content does not include homicidal or suicidal plan.        Cognition and Memory: Cognition and memory normal.        Judgment: Judgment normal.     Comments: Insight intact     Lab Review:     Component Value Date/Time   NA 137 02/15/2020 1624   K 4.2 02/15/2020 1624   CL 104 02/15/2020 1624   CO2 26 02/15/2020 1624   GLUCOSE 94 02/15/2020 1624  BUN 14 02/15/2020 1624   CREATININE 0.78 02/15/2020 1624   CALCIUM 8.4 02/15/2020 1624   PROT 6.5 02/15/2020 1624   ALBUMIN 4.3 02/15/2020 1624   AST 17 02/15/2020 1624   ALT 18 02/15/2020 1624   ALKPHOS 121 (H) 02/15/2020 1624   BILITOT 0.3 02/15/2020 1624   GFRNONAA >90 10/03/2014 1027   GFRAA >90 10/03/2014 1027       Component Value Date/Time   WBC 7.5 02/15/2020 1624   RBC 4.52 02/15/2020 1624   HGB 13.3 02/15/2020 1624   HCT 39.5 02/15/2020 1624   PLT 272.0 02/15/2020 1624   MCV 87.4 02/15/2020 1624   MCH 29.2 10/03/2014  1027   MCHC 33.8 02/15/2020 1624   RDW 13.2 02/15/2020 1624    No results found for: POCLITH, LITHIUM   No results found for: PHENYTOIN, PHENOBARB, VALPROATE, CBMZ   .res Assessment: Plan:   Patient seen for 30 minutes and time spent counseling patient regarding treatment plan.  She reports that residual signs and symptoms are consistent with her baseline and that overall current medications have been most effective for target signs and symptoms and would therefore like to continue current medications without changes. Continue lamotrigine 300 mg daily for mood signs and symptoms. Continue sertraline 200 mg daily for mood and anxiety signs and symptoms. Continue temazepam 15 mg at bedtime as needed for insomnia.  Patient reports that she typically only takes temazepam for difficulty sleeping while traveling. Patient to follow-up in 3 months or sooner if clinically indicated. Patient advised to contact office with any questions, adverse effects, or acute worsening in signs and symptoms.  Merie was seen today for follow-up.  Diagnoses and all orders for this visit:  Generalized anxiety disorder -     clonazePAM (KLONOPIN) 0.5 MG tablet; TAKE 1 TABLET BY MOUTH EVERY DAY AS NEEDED FOR ANXIETY -     sertraline (ZOLOFT) 100 MG tablet; Take 2 tablets (200 mg total) by mouth daily.  Depression, unspecified depression type -     sertraline (ZOLOFT) 100 MG tablet; Take 2 tablets (200 mg total) by mouth daily.  Insomnia, unspecified type -     temazepam (RESTORIL) 15 MG capsule; Take 1-2 caps po QHS     Please see After Visit Summary for patient specific instructions.  Future Appointments  Date Time Provider Department Center  11/06/2020 10:00 AM Corie Chiquito, PMHNP [facility] None  11/11/2020  1:45 PM Glenna Durand, RN [facility]    No orders of the defined types were placed in this encounter.   -------------------------------

## 2020-09-09 ENCOUNTER — Ambulatory Visit (INDEPENDENT_AMBULATORY_CARE_PROVIDER_SITE_OTHER): Payer: Medicare HMO

## 2020-09-09 DIAGNOSIS — J309 Allergic rhinitis, unspecified: Secondary | ICD-10-CM

## 2020-09-11 NOTE — Patient Instructions (Addendum)
It was great to see you again today- we are going to treat you for a sinus infection with amoxicillin.  Let me know if your symptoms are not improving over the next several days- Sooner if worse.

## 2020-09-11 NOTE — Progress Notes (Signed)
[facility] at [facility] [address], [place], [place] [address] [phone]  Date:  09/12/2020   Name:  Ashley Craig   DOB:  May 04, 1951   MRN:  [id number]  PCP:  Darreld Mclean, MD    Chief Complaint: Eye complaint (headache, head pressure, drainage)   History of Present Illness:  Ashley Craig is a 69 y.o. very pleasant female patient who presents with the following:  Ashley Craig is here today with concern about possible sinus infection-she does have a history of chronic corneal issue, Fuchs corneal dystrophy Last seen by myself in April of this year History of allergies, GERD, vertigo, HTN, Fuch's corneal dystrophy, likely benign elevation of alk phos She is getting allergy shots - this is just for her dog allergies, they have a pet dog and this does seem to help   Today she notes familiar sx of sinusitis for about 2 weeks with sinus pressure and discomfort around her nose and eyes-notes that she has very similar symptoms about twice a year generally.  She does notice discomfort with extraocular movements.  Otherwise no eye pain No fever She has a bit of cough and PND at baseline due to allergies.   No ST No vomiting or diarrhea  She does not tolerate decongestants very well  Flu vaccine done Mammogram up-to-date Colon cancer screen up-to-date DEXA scan up-to-date COVID-19 vaccine-booster done  Shingrix done Mild eval of alk phos has been evaluated- benign   She sees Dr. Annia Belt with [facility] ophthalmology-most recent visit in July Patient Active Problem List   Diagnosis Date Noted  . Allergic rhinitis due to animal hair and dander 09/25/2019  . Anaphylactic shock due to adverse food reaction 05/11/2018  . Seasonal allergic conjunctivitis 05/09/2018  . Impingement syndrome of left ankle 07/12/2017  . Diarrhea 05/21/2017  . Gastroesophageal reflux disease with esophagitis 05/21/2017  . History of colon polyps  05/21/2017  . Orthostatic hypotension 04/08/2017  . Chronic pain of left ankle 03/30/2017  . Unilateral primary osteoarthritis, left knee 11/13/2016  . Allergic dermatitis 08/12/2016  . Chronic dryness of both eyes 06/02/2016  . Hypermetropia of both eyes 06/02/2016  . Open angle with borderline findings and low glaucoma risk in both eyes 06/02/2016  . Pinguecula of both eyes 06/02/2016  . Presbyopia of both eyes 06/02/2016  . Regular astigmatism of both eyes 06/02/2016  . Cellulitis of right lower extremity 01/30/2016  . Varicose veins of left lower extremity with pain 01/16/2016  . Mild persistent asthma without complication [id number]  . Chronic venous insufficiency 08/07/2015  . Spontaneous hematoma of lower leg 11/30/2014  . Spinal stenosis of lumbar region 10/11/2014  . Allergic rhinitis 04/27/2014  . Adjustment disorder 04/27/2014  . Benign essential hypertension 04/27/2014  . Benign paroxysmal vertigo 04/27/2014  . Chondrocostal junction syndrome 04/27/2014  . Elevation of level of transaminase or lactic acid dehydrogenase (LDH) 04/27/2014  . 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 Overweight 04/27/2014  . Spontaneous ecchymosis 04/27/2014  . Tendonitis of shoulder 04/27/2014  . Gastroesophageal reflux disease 03/27/2014  . Anxiety 03/27/2014  . Environmental allergies 03/27/2014  . Hypercholesteremia 03/27/2014  . Insomnia 03/27/2014  . Vitamin D deficiency 03/27/2014    Past Medical History:  Diagnosis Date  . Anxiety    takes Zoloft daily;takes Clonazepam daily as needed  . Arthritis   . Chronic back pain    stenosis  . Diarrhea    occasionally  .  GERD (gastroesophageal reflux disease)    takes Omeprazole daily   . History of blood clots 10 yrs ago   left calf after a bone break  . History of bronchitis 2014  . History of colon polyps    benign  . Hyperlipidemia    takes Simvastatin daily  . Hypertension   . Insomnia    takes Melatonin nightly  .  Joint pain   . Seasonal allergies    takes Claritin daily;Nasonex and ProAir as needed;Singulair daily  . Weakness    numbness and tingling in right leg    Past Surgical History:  Procedure Laterality Date  . ANAL FISSURE REPAIR    . ANKLE SURGERY Left 09/2017  . colonosocpy    . D&C of bladder  53yr ago  . ESOPHAGOGASTRODUODENOSCOPY     with ED  . TONSILLECTOMY    . WRIST SURGERY Right    with plate    Social History   Tobacco Use  . Smoking status: Never Smoker  . Smokeless tobacco: Never Used  Vaping Use  . Vaping Use: Never used  Substance Use Topics  . Alcohol use: Yes    Comment: 1-2 glasses of wine daily  . Drug use: No    Family History  Problem Relation Age of Onset  . Cancer Mother   . Retinitis pigmentosa Father   . Depression Father   . Anxiety disorder Sister   . Melanoma Brother   . Anxiety disorder Brother   . Anorexia nervosa Brother   . Leukemia Niece   . Anorexia nervosa Cousin   . Allergic rhinitis Neg Hx   . Angioedema Neg Hx   . Asthma Neg Hx   . Eczema Neg Hx   . Immunodeficiency Neg Hx   . Urticaria Neg Hx   . Breast cancer Neg Hx     Allergies  Allergen Reactions  . Other     Shellfish-Anaphylaxis  . Codeine     nausea  . Dog Epithelium     Medication list has been reviewed and updated.  Current Outpatient Medications on File Prior to Visit  Medication Sig Dispense Refill  . albuterol (PROVENTIL) (2.5 MG/3ML) 0.083% nebulizer solution Take 3 mLs (2.5 mg total) by nebulization every 6 (six) hours as needed for wheezing or shortness of breath. 75 mL 1  . albuterol (VENTOLIN HFA) 108 (90 Base) MCG/ACT inhaler INHALE 2 PUFFS INTO THE LUNGS EVERY 4 (FOUR) HOURS AS NEEDED FOR WHEEZING OR SHORTNESS OF BREATH. 18 g 0  . azelastine (ASTELIN) 0.1 % nasal spray TWO SPRAYS IN EACH NOSTRIL TWICE A DAY IF NEEDED 30 mL 5  . CELESTONE SOLUSPAN 6 (3-3) MG/ML injection 2 CC CELESTONE LAJO#[id number]   . clobetasol (TEMOVATE) 0.05 % external  solution Apply 1 application topically 2 (two) times daily as needed.    . clonazePAM (KLONOPIN) 0.5 MG tablet TAKE 1 TABLET BY MOUTH EVERY DAY AS NEEDED FOR ANXIETY 30 tablet 5  . diclofenac sodium (VOLTAREN) 1 % GEL APPLY TO AFFECTED AREA 1-3 TIMES A DAY FOR 30 DAYS  1  . EPINEPHrine (AUVI-Q) 0.3 mg/0.3 mL IJ SOAJ injection Use as directed for severe allergic reactions 2 each 1  . fluticasone (FLONASE) 50 MCG/ACT nasal spray 2 sprays per nostril  once daily if needed  for stuffy nose. 48 g 3  . ibuprofen (ADVIL,MOTRIN) 200 MG tablet Take 400 mg by mouth 3 (three) times daily as needed. Reported on 11/05/2015    . loratadine (  CLARITIN) 10 MG tablet TAKE 1 TABLET BY MOUTH EVERY DAY 90 tablet 1  . Melatonin 3 MG TABS Take 3 mg by mouth at bedtime. Reported on 11/05/2015    . montelukast (SINGULAIR) 10 MG tablet Take 1 tablet (10 mg total) by mouth at bedtime. 90 tablet 3  . NON FORMULARY     . olopatadine (PATANOL) 0.1 % ophthalmic solution Place 1 drop into both eyes 2 (two) times daily. 5 mL 1  . Olopatadine HCl (PAZEO) 0.7 % SOLN Place 1 drop into both eyes daily as needed. 7.5 mL 3  . omeprazole (PRILOSEC) 20 MG capsule TAKE 1 CAPSULE BY MOUTH EVERY DAY (Patient taking differently: Take 20 mg by mouth daily as needed. ) 90 capsule 1  . perindopril (ACEON) 2 MG tablet TAKE 1 TABLET BY MOUTH EVERY DAY 90 tablet 3  . sertraline (ZOLOFT) 100 MG tablet Take 2 tablets (200 mg total) by mouth daily. 180 tablet 0  . simvastatin (ZOCOR) 40 MG tablet Take 1 tablet (40 mg total) by mouth at bedtime. 90 tablet 3  . temazepam (RESTORIL) 15 MG capsule Take 1-2 caps po QHS 30 capsule 2  . lamoTRIgine (LAMICTAL) 150 MG tablet Take 2 tablets (300 mg total) by mouth daily. 180 tablet 1   No current facility-administered medications on file prior to visit.    Review of Systems:  As per HPI- otherwise negative.   Physical Examination: Vitals:   09/12/20 1422 09/12/20 1442  BP: (!) 154/82 138/78  Pulse: 75    Resp: 17   Temp: 98.3 F (36.8 C)   SpO2: 98%    Vitals:   09/12/20 1422  Weight: 197 lb (89.4 kg)  Height: _0  (1.702 m)   Body mass index is 30.85 kg/m. Ideal Body Weight: Weight in (lb) to have BMI = 25: 159.3  GEN: no acute distress.  Overweight, looks well otherwise.  No redness or swelling of lids or periorbital area HEENT: Atraumatic, Normocephalic. Pt notes tenderness to percussion over her anterior sinuses  Ears and Nose: No external deformity. CV: RRR, No M/G/R. No JVD. No thrill. No extra heart sounds. PULM: CTA B, no wheezes, crackles, rhonchi. No retractions. No resp. distress. No accessory muscle use. ABD: S, NT, ND, +BS. No rebound. No HSM. EXTR: No c/c/e PSYCH: Normally interactive. Conversant.    Assessment and Plan: Acute recurrent frontal sinusitis - Plan: amoxicillin (AMOXIL) 500 MG capsule  Patient today with likely acute sinusitis.  Will treat with amoxicillin as above.  She does have discomfort with movement of her eyes, discussed orbital cellulitis.  She has had symptoms for about 2 weeks without worsening, she generally feels well.  Given his clinical picture I feel orbital cellulitis is much less likely.  However, she will monitor her symptoms closely and let me know if she is not feeling better over the next several days- Sooner if worse.   This visit occurred during the SARS-CoV-2 public health emergency.  Safety protocols were in place, including screening questions prior to the visit, additional usage of staff PPE, and extensive cleaning of exam room while observing appropriate contact time as indicated for disinfecting solutions.    Signed Lamar Blinks, MD

## 2020-09-12 ENCOUNTER — Other Ambulatory Visit: Payer: Self-pay

## 2020-09-12 ENCOUNTER — Encounter: Payer: Self-pay | Admitting: Family Medicine

## 2020-09-12 ENCOUNTER — Ambulatory Visit (INDEPENDENT_AMBULATORY_CARE_PROVIDER_SITE_OTHER): Payer: Medicare HMO | Admitting: Family Medicine

## 2020-09-12 VITALS — BP 138/78 | HR 75 | Temp 98.3°F | Resp 17 | Ht 67.0 in | Wt 197.0 lb

## 2020-09-12 DIAGNOSIS — J0111 Acute recurrent frontal sinusitis: Secondary | ICD-10-CM

## 2020-09-12 MED ORDER — AMOXICILLIN 500 MG PO CAPS: 1000.0000 mg | ORAL_CAPSULE | Freq: Two times a day (BID) | ORAL | 0 refills | Status: DC

## 2020-09-16 ENCOUNTER — Ambulatory Visit (INDEPENDENT_AMBULATORY_CARE_PROVIDER_SITE_OTHER): Payer: Medicare HMO

## 2020-09-16 DIAGNOSIS — J309 Allergic rhinitis, unspecified: Secondary | ICD-10-CM | POA: Diagnosis not present

## 2020-09-23 ENCOUNTER — Ambulatory Visit (INDEPENDENT_AMBULATORY_CARE_PROVIDER_SITE_OTHER): Payer: Medicare HMO

## 2020-09-23 DIAGNOSIS — J309 Allergic rhinitis, unspecified: Secondary | ICD-10-CM | POA: Diagnosis not present

## 2020-09-30 ENCOUNTER — Ambulatory Visit (INDEPENDENT_AMBULATORY_CARE_PROVIDER_SITE_OTHER): Payer: Medicare HMO

## 2020-09-30 DIAGNOSIS — J309 Allergic rhinitis, unspecified: Secondary | ICD-10-CM | POA: Diagnosis not present

## 2020-10-07 ENCOUNTER — Ambulatory Visit (INDEPENDENT_AMBULATORY_CARE_PROVIDER_SITE_OTHER): Payer: Medicare HMO

## 2020-10-07 DIAGNOSIS — J309 Allergic rhinitis, unspecified: Secondary | ICD-10-CM | POA: Diagnosis not present

## 2020-10-17 ENCOUNTER — Ambulatory Visit (INDEPENDENT_AMBULATORY_CARE_PROVIDER_SITE_OTHER): Payer: Medicare HMO

## 2020-10-17 DIAGNOSIS — J309 Allergic rhinitis, unspecified: Secondary | ICD-10-CM | POA: Diagnosis not present

## 2020-10-18 ENCOUNTER — Other Ambulatory Visit: Payer: Self-pay | Admitting: Family Medicine

## 2020-10-22 ENCOUNTER — Ambulatory Visit (INDEPENDENT_AMBULATORY_CARE_PROVIDER_SITE_OTHER): Payer: Medicare HMO

## 2020-10-22 DIAGNOSIS — J309 Allergic rhinitis, unspecified: Secondary | ICD-10-CM | POA: Diagnosis not present

## 2020-10-30 ENCOUNTER — Telehealth: Payer: Self-pay

## 2020-10-30 ENCOUNTER — Ambulatory Visit (INDEPENDENT_AMBULATORY_CARE_PROVIDER_SITE_OTHER): Payer: Medicare HMO

## 2020-10-30 DIAGNOSIS — J309 Allergic rhinitis, unspecified: Secondary | ICD-10-CM

## 2020-10-30 MED ORDER — MONTELUKAST SODIUM 10 MG PO TABS
10.0000 mg | ORAL_TABLET | Freq: Every day | ORAL | 3 refills | Status: DC
Start: 2020-10-30 — End: 2021-03-07

## 2020-10-30 NOTE — Telephone Encounter (Signed)
Patient came in for allergy injections and needed a refill on Montelukast.

## 2020-11-06 ENCOUNTER — Encounter: Payer: Self-pay | Admitting: Psychiatry

## 2020-11-06 ENCOUNTER — Other Ambulatory Visit: Payer: Self-pay

## 2020-11-06 ENCOUNTER — Ambulatory Visit (INDEPENDENT_AMBULATORY_CARE_PROVIDER_SITE_OTHER): Payer: Medicare HMO | Admitting: Psychiatry

## 2020-11-06 DIAGNOSIS — F32A Depression, unspecified: Secondary | ICD-10-CM

## 2020-11-06 DIAGNOSIS — G4700 Insomnia, unspecified: Secondary | ICD-10-CM | POA: Diagnosis not present

## 2020-11-06 DIAGNOSIS — F411 Generalized anxiety disorder: Secondary | ICD-10-CM

## 2020-11-06 NOTE — Progress Notes (Signed)
Ashley Craig [id number] 26-Apr-1951 70 y.o.  Subjective:   Patient ID:  Ashley Craig is a 70 y.o. (DOB 10/31/1951) female.  Chief Complaint:  Chief Complaint  Patient presents with   Follow-up    Anxiety and mood disturbance    HPI [name] presents to the office today for follow-up of mood disturbance and anxiety. She reports that she had a good Christmas and able to spend with it with family and they came to visit her at Thanksgiving. Her brother has been doing well with cancer diagnosis. She reports that she has had some days when anxiety and mood has been more down and attributes this to being confined to home. She reports occasional irritation in response to stressors. She has some projects going on at her house. She reports energy and motivation have been ok. Notices her stamina is less over the years. 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 Sleeping well and is sleeping about 8 hours a night. Appetite has been good. Concentration has been adequate. Denies SI.   She uses Klonopin prn only and not everyday.   Past Psychiatric Medication Trials: Klonopin- Effective for anxiety. She reports that she has gone weeks without taking it and takes more of it during times of increased stress Lamictal- Has helped stabilize mood. Reports that she has been on higher and lower doses. Has taken at least one year.  Effexor XR- Severe discontinuation s/s.  Prozac Zoloft- Has taken long-term Temazepam- Has used prn when traveling  PHQ2-9   Flowsheet Row Clinical Support from 11/10/2019 in [facility]  PHQ-2 Total Score 0       Review of Systems:  Review of Systems  Musculoskeletal: Negative for gait problem.  Neurological: Negative for tremors.  Psychiatric/Behavioral:       Please refer to HPI    Medications: I have reviewed the patient's current medications.  Current  Outpatient Medications  Medication Sig Dispense Refill   albuterol (PROVENTIL) (2.5 MG/3ML) 0.083% nebulizer solution Take 3 mLs (2.5 mg total) by nebulization every 6 (six) hours as needed for wheezing or shortness of breath. 75 mL 1   albuterol (VENTOLIN HFA) 108 (90 Base) MCG/ACT inhaler INHALE 2 PUFFS INTO THE LUNGS EVERY 4 (FOUR) HOURS AS NEEDED FOR WHEEZING OR SHORTNESS OF BREATH. 18 g 0   amoxicillin (AMOXIL) 500 MG capsule Take 2 capsules (1,000 mg total) by mouth 2 (two) times daily. (Patient not taking: Reported on 11/06/2020) 40 capsule 0   azelastine (ASTELIN) 0.1 % nasal spray TWO SPRAYS IN EACH NOSTRIL TWICE A DAY IF NEEDED 30 mL 5   CELESTONE SOLUSPAN 6 (3-3) MG/ML injection 2 CC CELESTONE [id number]     clobetasol (TEMOVATE) 0.05 % external solution Apply 1 application topically 2 (two) times daily as needed.     clonazePAM (KLONOPIN) 0.5 MG tablet TAKE 1 TABLET BY MOUTH EVERY DAY AS NEEDED FOR ANXIETY 30 tablet 5   diclofenac sodium (VOLTAREN) 1 % GEL APPLY TO AFFECTED AREA 1-3 TIMES A DAY FOR 30 DAYS  1   EPINEPHrine (AUVI-Q) 0.3 mg/0.3 mL IJ SOAJ injection Use as directed for severe allergic reactions 2 each 1   fluticasone (FLONASE) 50 MCG/ACT nasal spray 2 sprays per nostril  once daily if needed  for stuffy nose. 48 g 3   ibuprofen (ADVIL,MOTRIN) 200 MG tablet Take 400 mg by mouth 3 (three) times daily as needed. Reported on 11/05/2015  lamoTRIgine (LAMICTAL) 150 MG tablet Take 2 tablets (300 mg total) by mouth daily. 180 tablet 1   loratadine (CLARITIN) 10 MG tablet TAKE 1 TABLET BY MOUTH EVERY DAY 90 tablet 1   Melatonin 3 MG TABS Take 3 mg by mouth at bedtime. Reported on 11/05/2015     montelukast (SINGULAIR) 10 MG tablet Take 1 tablet (10 mg total) by mouth at bedtime. 90 tablet 3   NON FORMULARY      olopatadine (PATANOL) 0.1 % ophthalmic solution Place 1 drop into both eyes 2 (two) times daily. 5 mL 1   Olopatadine HCl (PAZEO) 0.7 % SOLN Place 1 drop  into both eyes daily as needed. 7.5 mL 3   omeprazole (PRILOSEC) 20 MG capsule TAKE 1 CAPSULE BY MOUTH EVERY DAY (Patient taking differently: Take 20 mg by mouth daily as needed. ) 90 capsule 1   perindopril (ACEON) 2 MG tablet TAKE 1 TABLET BY MOUTH EVERY DAY 90 tablet 3   sertraline (ZOLOFT) 100 MG tablet Take 2 tablets (200 mg total) by mouth daily. 180 tablet 0   simvastatin (ZOCOR) 40 MG tablet Take 1 tablet (40 mg total) by mouth at bedtime. 90 tablet 3   temazepam (RESTORIL) 15 MG capsule Take 1-2 caps po QHS 30 capsule 2   No current facility-administered medications for this visit.    Medication Side Effects: None  Allergies:  Allergies  Allergen Reactions   Other     Shellfish-Anaphylaxis   Codeine     nausea   Dog Epithelium     Past Medical History:  Diagnosis Date   Anxiety    takes Zoloft daily;takes Clonazepam daily as needed   Arthritis    Chronic back pain    stenosis   Diarrhea    occasionally   GERD (gastroesophageal reflux disease)    takes Omeprazole daily    History of blood clots 10 yrs ago   left calf after a bone break   History of bronchitis 2014   History of colon polyps    benign   Hyperlipidemia    takes Simvastatin daily   Hypertension    Insomnia    takes Melatonin nightly   Joint pain    Seasonal allergies    takes Claritin daily;Nasonex and ProAir as needed;Singulair daily   Weakness    numbness and tingling in right leg    Family History  Problem Relation Age of Onset   Cancer Mother    Retinitis pigmentosa Father    Depression Father    Anxiety disorder Sister    Melanoma Brother    Anxiety disorder Brother    Anorexia nervosa Brother    Leukemia Niece    Anorexia nervosa Cousin    Allergic rhinitis Neg Hx    Angioedema Neg Hx    Asthma Neg Hx    Eczema Neg Hx    Immunodeficiency Neg Hx    Urticaria Neg Hx    Breast cancer Neg Hx     Social History   Socioeconomic History    Marital status: Divorced    Spouse name: Not on file   Number of children: Not on file   Years of education: Not on file   Highest education level: Not on file  Occupational History   Not on file  Tobacco Use   Smoking status: Never Smoker   Smokeless tobacco: Never Used  Vaping Use   Vaping Use: Never used  Substance and Sexual Activity   Alcohol use:  Yes    Comment: 1-2 glasses of wine daily   Drug use: No   Sexual activity: Not on file  Other Topics Concern   Not on file  Social History Narrative   Not on file   Social Determinants of Health   Financial Resource Strain: Not on file  Food Insecurity: Not on file  Transportation Needs: Not on file  Physical Activity: Not on file  Stress: Not on file  Social Connections: Not on file  Intimate Partner Violence: Not on file    Past Medical History, Surgical history, Social history, and Family history were reviewed and updated as appropriate.   Please see review of systems for further details on the patient's review from today.   Objective:   Physical Exam:  There were no vitals taken for this visit.  Physical Exam Constitutional:      General: She is not in acute distress. Musculoskeletal:        General: No deformity.  Neurological:     Mental Status: She is alert and oriented to person, place, and time.     Coordination: Coordination normal.  Psychiatric:        Attention and Perception: Attention and perception normal. She does not perceive auditory or visual hallucinations.        Mood and Affect: Mood normal. Mood is not anxious or depressed. Affect is not labile, blunt, angry or inappropriate.        Speech: Speech normal.        Behavior: Behavior normal.        Thought Content: Thought content normal. Thought content is not paranoid or delusional. Thought content does not include homicidal or suicidal ideation. Thought content does not include homicidal or suicidal plan.        Cognition  and Memory: Cognition and memory normal.        Judgment: Judgment normal.     Comments: Insight intact     Lab Review:     Component Value Date/Time   NA 137 02/15/2020 1624   K 4.2 02/15/2020 1624   CL 104 02/15/2020 1624   CO2 26 02/15/2020 1624   GLUCOSE 94 02/15/2020 1624   BUN 14 02/15/2020 1624   CREATININE 0.78 02/15/2020 1624   CALCIUM 8.4 02/15/2020 1624   PROT 6.5 02/15/2020 1624   ALBUMIN 4.3 02/15/2020 1624   AST 17 02/15/2020 1624   ALT 18 02/15/2020 1624   ALKPHOS 121 (H) 02/15/2020 1624   BILITOT 0.3 02/15/2020 1624   GFRNONAA >90 10/03/2014 1027   GFRAA >90 10/03/2014 1027       Component Value Date/Time   WBC 7.5 02/15/2020 1624   RBC 4.52 02/15/2020 1624   HGB 13.3 02/15/2020 1624   HCT 39.5 02/15/2020 1624   PLT 272.0 02/15/2020 1624   MCV 87.4 02/15/2020 1624   MCH 29.2 10/03/2014 1027   MCHC 33.8 02/15/2020 1624   RDW 13.2 02/15/2020 1624    No results found for: POCLITH, LITHIUM   No results found for: PHENYTOIN, PHENOBARB, VALPROATE, CBMZ   .res Assessment: Plan:   Will continue current plan of care since target signs and symptoms are well controlled without any tolerability issues. Continue Klonopin 0.5 mg po qd prn anxiety.  Continue Lamictal 150 mg po BID for mood s/s. Continue Sertraline 200 mg po qd for depression and anxiety.  Continue Temazepam 15 mg 1-2 capsules po QHS prn insomnia.  She reports that she does not need any refills of medication  at this time.  Pt to follow-up in 3 months or sooner if clinically indicated.  Patient advised to contact office with any questions, adverse effects, or acute worsening in signs and symptoms.   Eboney was seen today for follow-up.  Diagnoses and all orders for this visit:  Generalized anxiety disorder  Depression, unspecified depression type  Insomnia, unspecified type     Please see After Visit Summary for patient specific instructions.  Future Appointments  Date Time Provider  Department Center  02/04/2021  1:30 PM Corie Chiquito, PMHNP CP-CP None    No orders of the defined types were placed in this encounter.   -------------------------------

## 2020-11-11 ENCOUNTER — Ambulatory Visit: Payer: Self-pay | Admitting: *Deleted

## 2020-11-20 ENCOUNTER — Ambulatory Visit (INDEPENDENT_AMBULATORY_CARE_PROVIDER_SITE_OTHER): Payer: Medicare HMO

## 2020-11-20 DIAGNOSIS — J309 Allergic rhinitis, unspecified: Secondary | ICD-10-CM | POA: Diagnosis not present

## 2020-11-29 ENCOUNTER — Ambulatory Visit (INDEPENDENT_AMBULATORY_CARE_PROVIDER_SITE_OTHER): Payer: Medicare HMO | Admitting: *Deleted

## 2020-11-29 DIAGNOSIS — J309 Allergic rhinitis, unspecified: Secondary | ICD-10-CM | POA: Diagnosis not present

## 2020-12-06 ENCOUNTER — Ambulatory Visit (INDEPENDENT_AMBULATORY_CARE_PROVIDER_SITE_OTHER): Payer: Medicare HMO

## 2020-12-06 DIAGNOSIS — J309 Allergic rhinitis, unspecified: Secondary | ICD-10-CM | POA: Diagnosis not present

## 2020-12-13 ENCOUNTER — Ambulatory Visit (INDEPENDENT_AMBULATORY_CARE_PROVIDER_SITE_OTHER): Payer: Medicare HMO

## 2020-12-13 DIAGNOSIS — J309 Allergic rhinitis, unspecified: Secondary | ICD-10-CM | POA: Diagnosis not present

## 2020-12-25 ENCOUNTER — Ambulatory Visit (INDEPENDENT_AMBULATORY_CARE_PROVIDER_SITE_OTHER): Payer: Medicare HMO

## 2020-12-25 DIAGNOSIS — J309 Allergic rhinitis, unspecified: Secondary | ICD-10-CM | POA: Diagnosis not present

## 2020-12-30 ENCOUNTER — Telehealth (INDEPENDENT_AMBULATORY_CARE_PROVIDER_SITE_OTHER): Payer: Medicare HMO | Admitting: Family Medicine

## 2020-12-30 ENCOUNTER — Other Ambulatory Visit: Payer: Self-pay

## 2020-12-30 ENCOUNTER — Encounter: Payer: Self-pay | Admitting: Family Medicine

## 2020-12-30 VITALS — Temp 98.8°F

## 2020-12-30 DIAGNOSIS — J0111 Acute recurrent frontal sinusitis: Secondary | ICD-10-CM | POA: Diagnosis not present

## 2020-12-30 DIAGNOSIS — R059 Cough, unspecified: Secondary | ICD-10-CM | POA: Diagnosis not present

## 2020-12-30 DIAGNOSIS — R0981 Nasal congestion: Secondary | ICD-10-CM

## 2020-12-30 MED ORDER — DOXYCYCLINE HYCLATE 100 MG PO CAPS: 100.0000 mg | ORAL_CAPSULE | Freq: Two times a day (BID) | ORAL | 0 refills | Status: DC

## 2020-12-30 NOTE — Progress Notes (Signed)
[facility] [address] [place], [place] [address] [phone]  Date:  12/30/2020   Name:  Ashley Craig   DOB:  06-23-1951   MRN:  [id number]  PCP:  Pearline Cables, MD    Chief Complaint: Sinus Problem (Congestion, nose and throat burning, redness in throat, ear pain, no fever)   History of Present Illness:  Ashley Craig is a 70 y.o. very pleasant female patient who presents with the following:  Virtual visit today with concern of possible sinus infection PT location is home, provider is at office Pt ID confirmed wht 2 factors, she gives consent for a virtual visit today The pt and myself are present on the call today  She notes she tends to get a sinus infection this time of year She notes sinus congestion and is able to cough up some mucus as well Her throat is red, her nose is irritated No fever noted- she has checked her temp  No vomiting or diarrhea  Sx for about 4 days No sick contacts- she lives on her own  She did not get tested for covid 19 during this illness She did get tested in mid February and was negative She does have a neb machine that she can use as needed   She did start herself on some amox she had at home- 500 BID - she has used this for 4 days and does not seem to be getting better   Patient Active Problem List   Diagnosis Date Noted  . Allergic rhinitis due to animal hair and dander 09/25/2019  . Anaphylactic shock due to adverse food reaction 05/11/2018  . Seasonal allergic conjunctivitis 05/09/2018  . Impingement syndrome of left ankle 07/12/2017  . Diarrhea 05/21/2017  . Gastroesophageal reflux disease with esophagitis 05/21/2017  . History of colon polyps 05/21/2017  . Orthostatic hypotension 04/08/2017  . Chronic pain of left ankle 03/30/2017  . Unilateral primary osteoarthritis, left knee 11/13/2016  . Allergic dermatitis 08/12/2016  . Chronic dryness of both eyes  06/02/2016  . Hypermetropia of both eyes 06/02/2016  . Open angle with borderline findings and low glaucoma risk in both eyes 06/02/2016  . Pinguecula of both eyes 06/02/2016  . Presbyopia of both eyes 06/02/2016  . Regular astigmatism of both eyes 06/02/2016  . Cellulitis of right lower extremity 01/30/2016  . Varicose veins of left lower extremity with pain 01/16/2016  . Mild persistent asthma without complication 11/05/2015  . Chronic venous insufficiency 08/07/2015  . Spontaneous hematoma of lower leg 11/30/2014  . Spinal stenosis of lumbar region 10/11/2014  . Allergic rhinitis 04/27/2014  . Adjustment disorder 04/27/2014  . Benign essential hypertension 04/27/2014  . Benign paroxysmal vertigo 04/27/2014  . Chondrocostal junction syndrome 04/27/2014  . Elevation of level of transaminase or lactic acid dehydrogenase (LDH) 04/27/2014  . 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 Overweight 04/27/2014  . Spontaneous ecchymosis 04/27/2014  . Tendonitis of shoulder 04/27/2014  . Gastroesophageal reflux disease 03/27/2014  . Anxiety 03/27/2014  . Environmental allergies 03/27/2014  . Hypercholesteremia 03/27/2014  . Insomnia 03/27/2014  . Vitamin D deficiency 03/27/2014    Past Medical History:  Diagnosis Date  . Anxiety    takes Zoloft daily;takes Clonazepam daily as needed  . Arthritis   . Chronic back pain    stenosis  . Diarrhea    occasionally  . GERD (gastroesophageal reflux disease)    takes Omeprazole daily   .  History of blood clots 10 yrs ago   left calf after a bone break  . History of bronchitis 2014  . History of colon polyps    benign  . Hyperlipidemia    takes Simvastatin daily  . Hypertension   . Insomnia    takes Melatonin nightly  . Joint pain   . Seasonal allergies    takes Claritin daily;Nasonex and ProAir as needed;Singulair daily  . Weakness    numbness and tingling in right leg    Past Surgical History:  Procedure Laterality Date  . ANAL  FISSURE REPAIR    . ANKLE SURGERY Left 09/2017  . colonosocpy    . D&C of bladder  45yrs ago  . ESOPHAGOGASTRODUODENOSCOPY     with ED  . TONSILLECTOMY    . WRIST SURGERY Right    with plate    Social History   Tobacco Use  . Smoking status: Never Smoker  . Smokeless tobacco: Never Used  Vaping Use  . Vaping Use: Never used  Substance Use Topics  . Alcohol use: Yes    Comment: 1-2 glasses of wine daily  . Drug use: No    Family History  Problem Relation Age of Onset  . Cancer Mother   . Retinitis pigmentosa Father   . Depression Father   . Anxiety disorder Sister   . Melanoma Brother   . Anxiety disorder Brother   . Anorexia nervosa Brother   . Leukemia Niece   . Anorexia nervosa Cousin   . Allergic rhinitis Neg Hx   . Angioedema Neg Hx   . Asthma Neg Hx   . Eczema Neg Hx   . Immunodeficiency Neg Hx   . Urticaria Neg Hx   . Breast cancer Neg Hx     Allergies  Allergen Reactions  . Other Anaphylaxis    Shellfish-Anaphylaxis  . Codeine     nausea  . Dog Epithelium     Medication list has been reviewed and updated.  Current Outpatient Medications on File Prior to Visit  Medication Sig Dispense Refill  . albuterol (PROVENTIL) (2.5 MG/3ML) 0.083% nebulizer solution Take 3 mLs (2.5 mg total) by nebulization every 6 (six) hours as needed for wheezing or shortness of breath. 75 mL 1  . albuterol (VENTOLIN HFA) 108 (90 Base) MCG/ACT inhaler INHALE 2 PUFFS INTO THE LUNGS EVERY 4 (FOUR) HOURS AS NEEDED FOR WHEEZING OR SHORTNESS OF BREATH. 18 g 0  . azelastine (ASTELIN) 0.1 % nasal spray TWO SPRAYS IN EACH NOSTRIL TWICE A DAY IF NEEDED 30 mL 5  . CELESTONE SOLUSPAN 6 (3-3) MG/ML injection 2 CC CELESTONE GGE#[id number]    . clobetasol (TEMOVATE) 0.05 % external solution Apply 1 application topically 2 (two) times daily as needed.    . clonazePAM (KLONOPIN) 0.5 MG tablet TAKE 1 TABLET BY MOUTH EVERY DAY AS NEEDED FOR ANXIETY 30 tablet 5  . diclofenac sodium (VOLTAREN)  1 % GEL APPLY TO AFFECTED AREA 1-3 TIMES A DAY FOR 30 DAYS  1  . EPINEPHrine (AUVI-Q) 0.3 mg/0.3 mL IJ SOAJ injection Use as directed for severe allergic reactions 2 each 1  . fluticasone (FLONASE) 50 MCG/ACT nasal spray 2 sprays per nostril  once daily if needed  for stuffy nose. 48 g 3  . ibuprofen (ADVIL,MOTRIN) 200 MG tablet Take 400 mg by mouth 3 (three) times daily as needed. Reported on 11/05/2015    . loratadine (CLARITIN) 10 MG tablet TAKE 1 TABLET BY MOUTH EVERY DAY 90 tablet  1  . Melatonin 3 MG TABS Take 3 mg by mouth at bedtime. Reported on 11/05/2015    . montelukast (SINGULAIR) 10 MG tablet Take 1 tablet (10 mg total) by mouth at bedtime. 90 tablet 3  . NON FORMULARY     . olopatadine (PATANOL) 0.1 % ophthalmic solution Place 1 drop into both eyes 2 (two) times daily. 5 mL 1  . Olopatadine HCl (PAZEO) 0.7 % SOLN Place 1 drop into both eyes daily as needed. 7.5 mL 3  . omeprazole (PRILOSEC) 20 MG capsule TAKE 1 CAPSULE BY MOUTH EVERY DAY (Patient taking differently: Take 20 mg by mouth daily as needed.) 90 capsule 1  . perindopril (ACEON) 2 MG tablet TAKE 1 TABLET BY MOUTH EVERY DAY 90 tablet 3  . sertraline (ZOLOFT) 100 MG tablet Take 2 tablets (200 mg total) by mouth daily. 180 tablet 0  . simvastatin (ZOCOR) 40 MG tablet Take 1 tablet (40 mg total) by mouth at bedtime. 90 tablet 3  . temazepam (RESTORIL) 15 MG capsule Take 1-2 caps po QHS 30 capsule 2  . lamoTRIgine (LAMICTAL) 150 MG tablet Take 2 tablets (300 mg total) by mouth daily. 180 tablet 1   No current facility-administered medications on file prior to visit.    Review of Systems:  As per HPI- otherwise negative.   Physical Examination: Vitals:   12/30/20 1141  Temp: 98.8 F (37.1 C)   There were no vitals filed for this visit. There is no height or weight on file to calculate BMI. Ideal Body Weight:    Pt observed via video monitor-she looks well but is coughing periodically She notes recent pulse and BP ok   She has checked her temperature and been afebrile  Assessment and Plan: Acute recurrent frontal sinusitis - Plan: doxycycline (VIBRAMYCIN) 100 MG capsule  Nasal congestion  Cough  Virtual visit today to discuss sinusitis.  Patient notes she tends to get sinusitis around this time every year.  Her symptoms are further detailed under HPI.  She actually started on amoxicillin 500 twice daily for the last 3 days, has not yet improved.  As such, we will switch her to doxycycline  She is asked to contact me if not feeling better in the next several days, sooner if feeling worse We also discussed the possibility of COVID-19.  I encouraged her to be tested or to isolate strictly for at least 5 days if she prefers not to retest  Video used for duration of visit today  Signed Abbe Amsterdam, MD

## 2021-01-10 ENCOUNTER — Ambulatory Visit (INDEPENDENT_AMBULATORY_CARE_PROVIDER_SITE_OTHER): Payer: Medicare HMO

## 2021-01-10 DIAGNOSIS — J309 Allergic rhinitis, unspecified: Secondary | ICD-10-CM

## 2021-01-13 IMAGING — US US ABDOMEN LIMITED
1 series · 14 of 25 positions shown · non-contrast
Comparison: None.

CLINICAL DATA: Elevated liver enzymes

EXAM:
ULTRASOUND ABDOMEN LIMITED RIGHT UPPER QUADRANT

[Series 1: us abdomen limited · 14 of 63 slices shown]
[im 1/63]
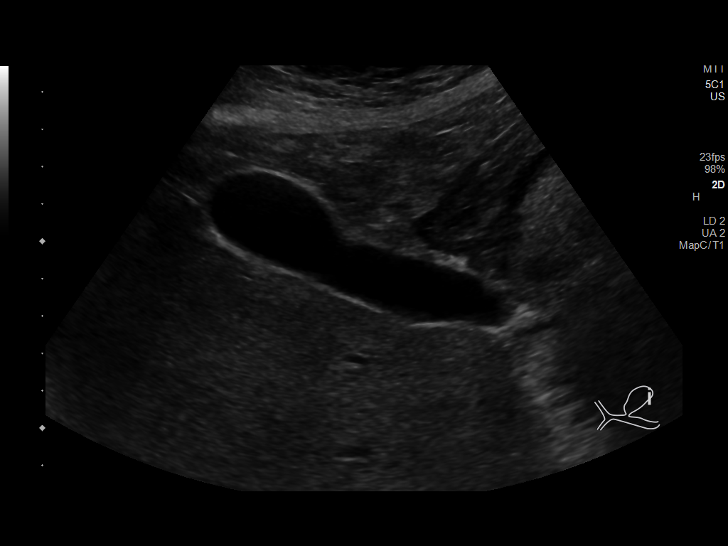
[im 6/63]
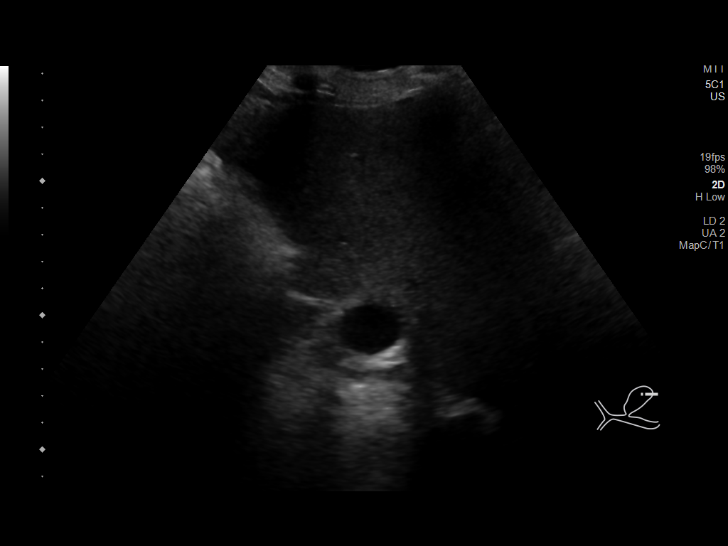
[im 11/63]
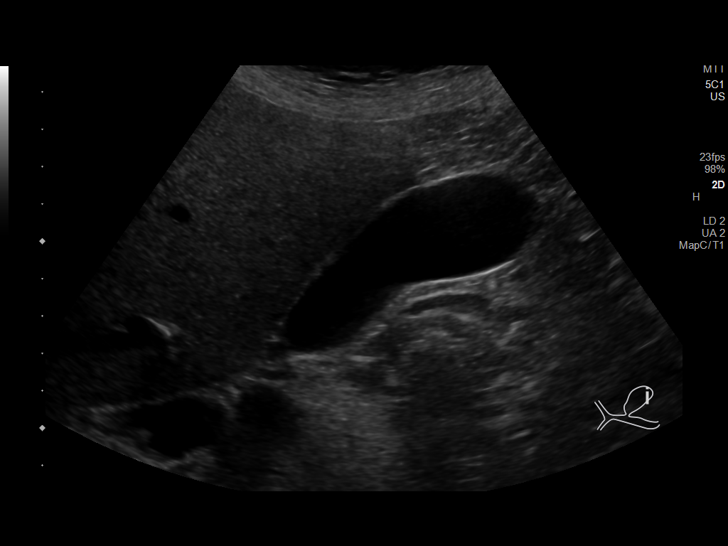
[im 16/63]
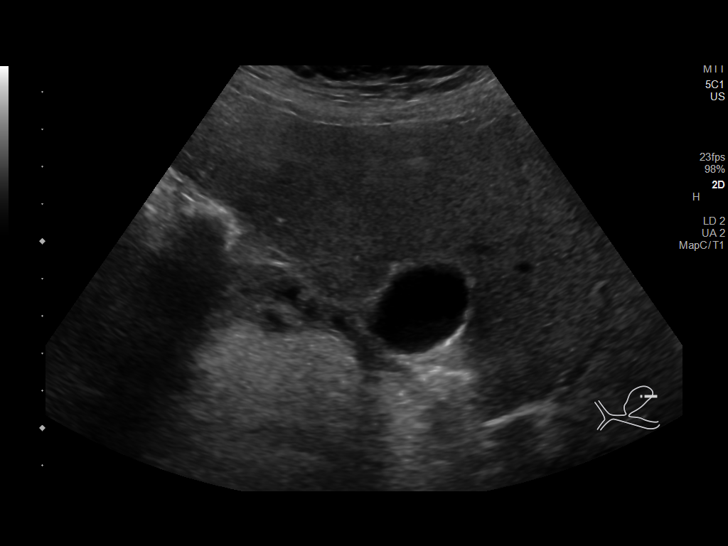
[im 21/63]
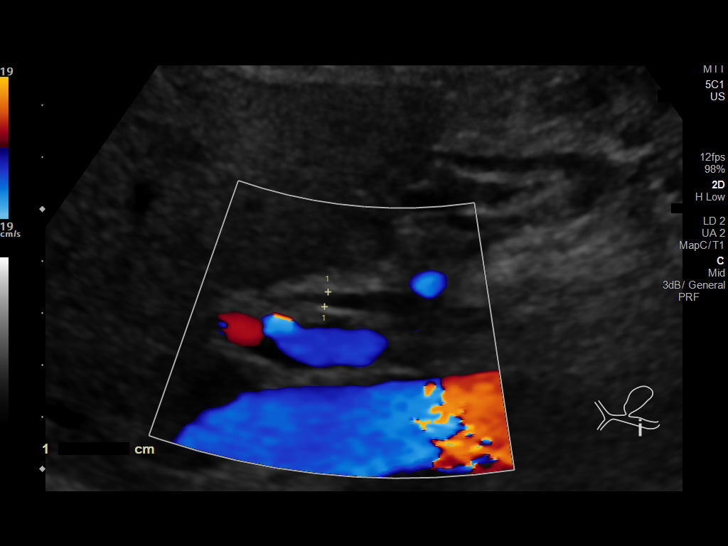
[im 24/63]
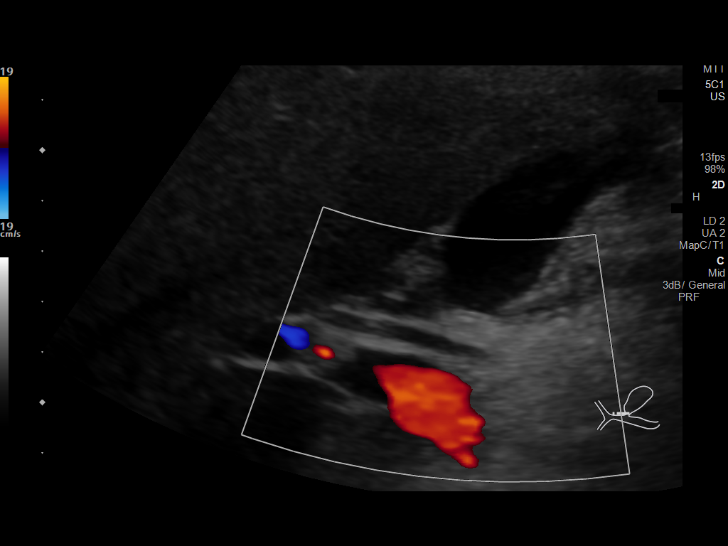
[im 29/63]
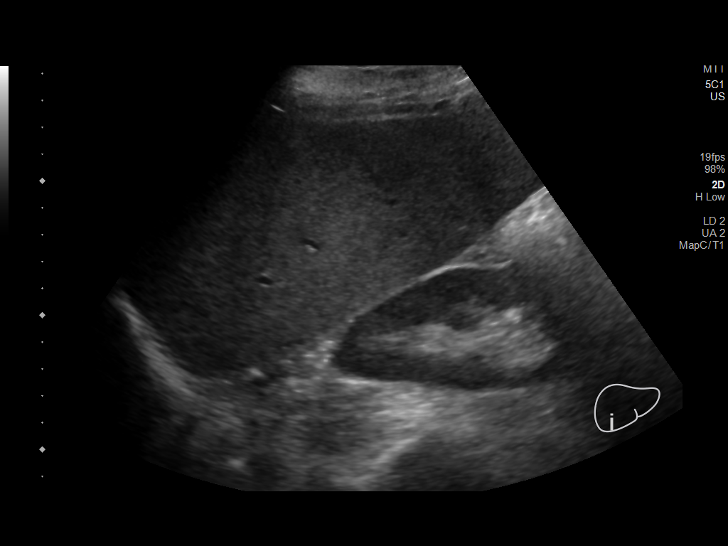
[im 34/63]
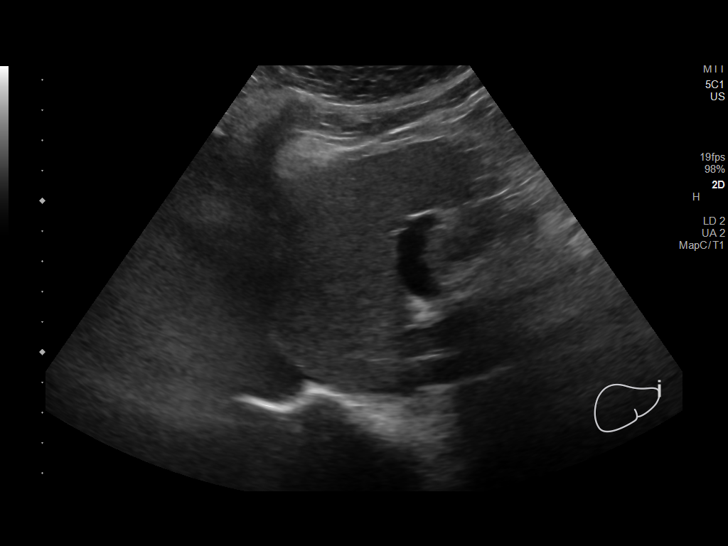
[im 39/63]
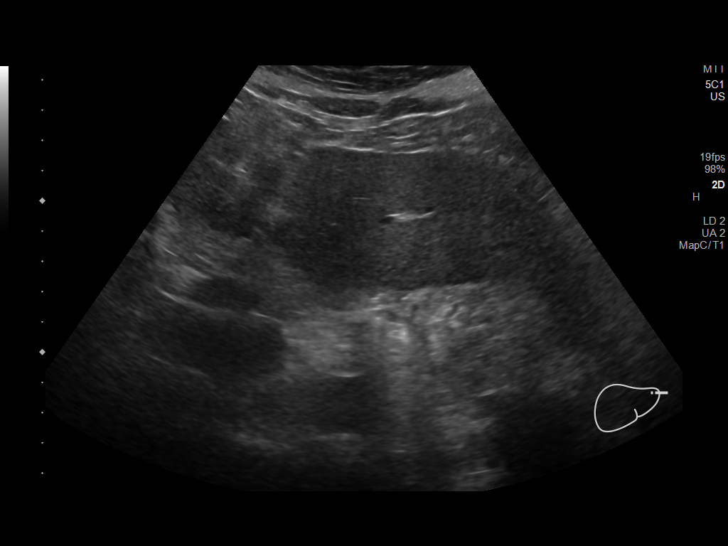
[im 42/63]
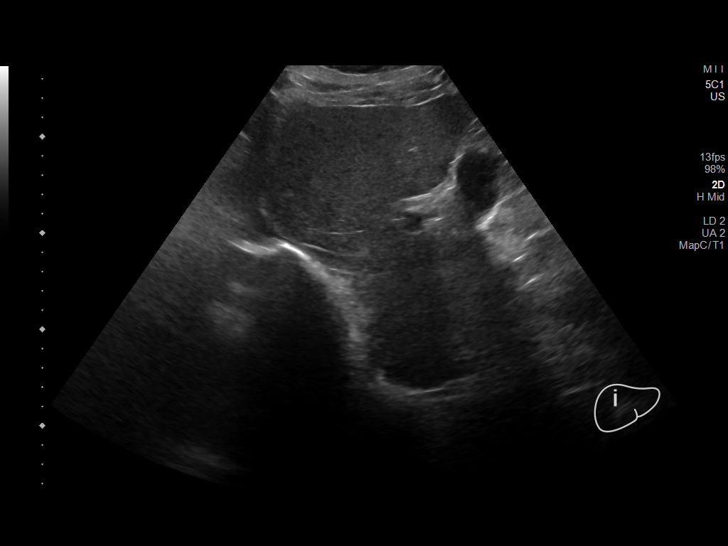
[im 47/63]
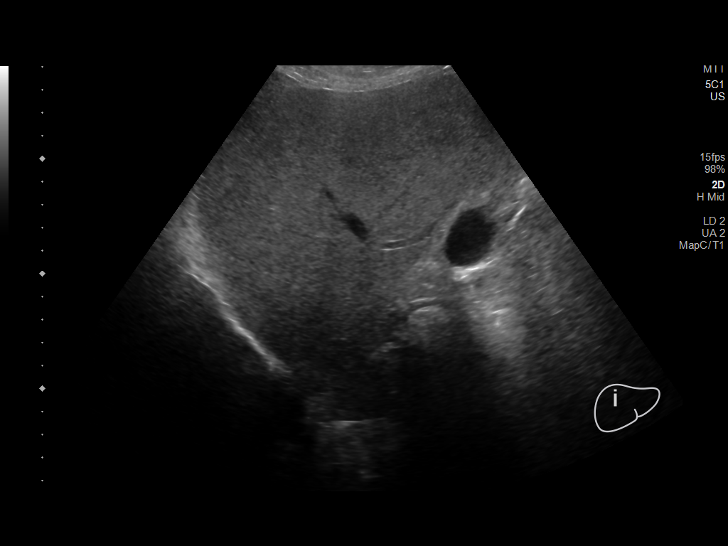
[im 52/63]
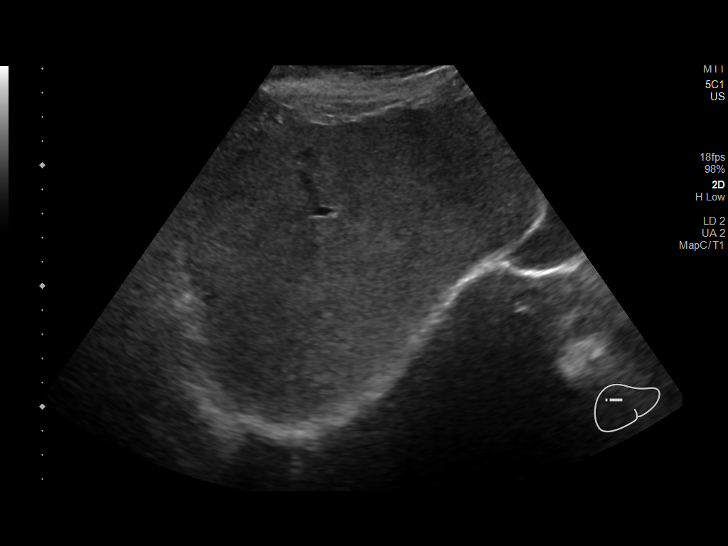
[im 57/63]
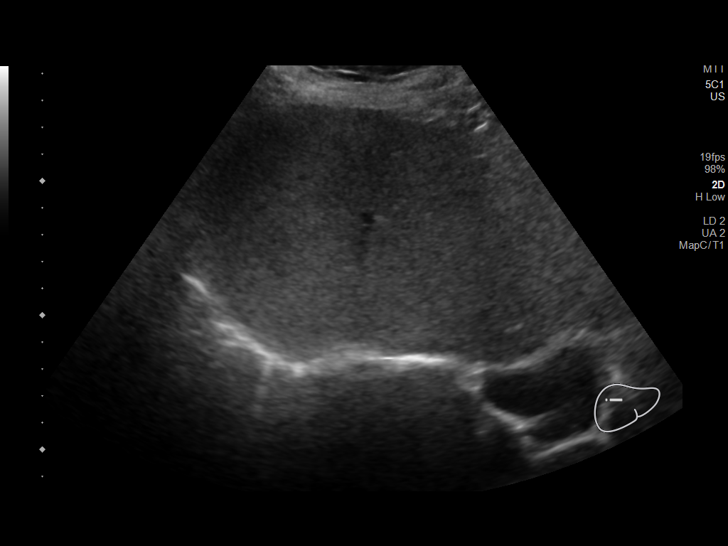
[im 63/63]
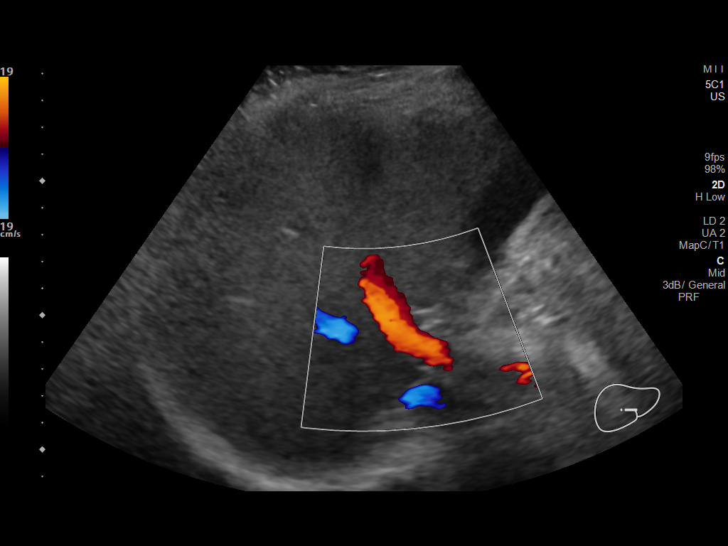

[14 of 25 positions shown; findings below may reference images not displayed]

FINDINGS: Gallbladder:

No gallstones or wall thickening visualized. There is no
pericholecystic fluid. No sonographic Murphy sign noted by
sonographer.

Common bile duct:

Diameter: 3 mm. No intrahepatic or extrahepatic biliary duct
dilatation.

Liver:

No focal lesion identified. Within normal limits in parenchymal
echogenicity. Portal vein is patent on color Doppler imaging with
normal direction of blood flow towards the liver.

Other: None.
IMPRESSION: Study within normal limits.

## 2021-01-16 ENCOUNTER — Ambulatory Visit (INDEPENDENT_AMBULATORY_CARE_PROVIDER_SITE_OTHER): Payer: Medicare HMO

## 2021-01-16 DIAGNOSIS — J309 Allergic rhinitis, unspecified: Secondary | ICD-10-CM

## 2021-01-19 ENCOUNTER — Other Ambulatory Visit: Payer: Self-pay | Admitting: Family Medicine

## 2021-01-23 DIAGNOSIS — J3081 Allergic rhinitis due to animal (cat) (dog) hair and dander: Secondary | ICD-10-CM | POA: Diagnosis not present

## 2021-01-23 NOTE — Progress Notes (Signed)
Vials exp 01-23-22 

## 2021-01-31 ENCOUNTER — Ambulatory Visit (INDEPENDENT_AMBULATORY_CARE_PROVIDER_SITE_OTHER): Payer: Medicare HMO | Admitting: *Deleted

## 2021-01-31 DIAGNOSIS — J309 Allergic rhinitis, unspecified: Secondary | ICD-10-CM

## 2021-02-01 ENCOUNTER — Other Ambulatory Visit: Payer: Self-pay | Admitting: Psychiatry

## 2021-02-01 DIAGNOSIS — F32A Depression, unspecified: Secondary | ICD-10-CM

## 2021-02-01 DIAGNOSIS — F411 Generalized anxiety disorder: Secondary | ICD-10-CM

## 2021-02-02 ENCOUNTER — Encounter: Payer: Self-pay | Admitting: Family Medicine

## 2021-02-04 ENCOUNTER — Encounter: Payer: Self-pay | Admitting: Psychiatry

## 2021-02-04 ENCOUNTER — Ambulatory Visit (INDEPENDENT_AMBULATORY_CARE_PROVIDER_SITE_OTHER): Payer: Medicare HMO | Admitting: Psychiatry

## 2021-02-04 ENCOUNTER — Other Ambulatory Visit: Payer: Self-pay

## 2021-02-04 DIAGNOSIS — F32A Depression, unspecified: Secondary | ICD-10-CM

## 2021-02-04 DIAGNOSIS — F411 Generalized anxiety disorder: Secondary | ICD-10-CM

## 2021-02-04 DIAGNOSIS — G4700 Insomnia, unspecified: Secondary | ICD-10-CM

## 2021-02-04 MED ORDER — TEMAZEPAM 15 MG PO CAPS: ORAL_CAPSULE | ORAL | 0 refills | Status: DC

## 2021-02-04 MED ORDER — CLONAZEPAM 0.5 MG PO TABS: ORAL_TABLET | ORAL | 2 refills | Status: DC

## 2021-02-04 NOTE — Progress Notes (Signed)
Ashley Craig [id number] [birth year] 70 y.o.  Subjective:   Patient ID:  Ashley Craig is a 70 y.o. (DOB 1950-12-01) female.  Chief Complaint:  Chief Complaint  Patient presents with  . Follow-up    H/o anxiety and mood disturbance    HPI Ashley Craig presents to the office today for follow-up of anxiety and mood disturbance. She reports some stress with HoA association. She reports that her anxiety has been manageable. She reports that she has been staying busy, which is helpful for her mental health. She reports brief periods of lower mood and energy. Denies elevated moods. She reports that activity is limited due to spinal issue. She reports adequate sleep with melatonin. Appetite is good. She reports that her concentration is ok. She reports that she can read for brief intervals. She reports that mood is lower on cloudy, rainy days. She reports that she has been unable to talk about situation in [place]. Denies SI.   Her niece is expecting a baby.   She reports that she was prescribed Gabapentin and has not taken it yet.   Past Psychiatric Medication Trials: Klonopin- Effective for anxiety. She reports that she has gone weeks without taking it and takes more of it during times of increased stress Lamictal- Has helped stabilize mood. Reports that she has been on higher and lower doses. Has taken at least one year.  Effexor XR- Severe discontinuation s/s.  Prozac Zoloft- Has taken long-term Temazepam- Has used prn when traveling  PHQ2-9   Flowsheet Row Clinical Support from 11/10/2019 in [facility] at [facility]  PHQ-2 Total Score 0       Review of Systems:  Review of Systems  Musculoskeletal: Positive for arthralgias and back pain. Negative for gait problem.  Neurological:       Sciatica  Psychiatric/Behavioral:       Please refer to HPI    Medications: I have reviewed the patient's current medications.  Current Outpatient Medications   Medication Sig Dispense Refill  . albuterol (PROVENTIL) (2.5 MG/3ML) 0.083% nebulizer solution Take 3 mLs (2.5 mg total) by nebulization every 6 (six) hours as needed for wheezing or shortness of breath. 75 mL 1  . albuterol (VENTOLIN HFA) 108 (90 Base) MCG/ACT inhaler INHALE 2 PUFFS INTO THE LUNGS EVERY 4 (FOUR) HOURS AS NEEDED FOR WHEEZING OR SHORTNESS OF BREATH. 18 g 0  . azelastine (ASTELIN) 0.1 % nasal spray TWO SPRAYS IN EACH NOSTRIL TWICE A DAY IF NEEDED 30 mL 5  . CELESTONE SOLUSPAN 6 (3-3) MG/ML injection 2 CC CELESTONE XVQ#[id number]    . clobetasol (TEMOVATE) 0.05 % external solution Apply 1 application topically 2 (two) times daily as needed.    Melene Muller ON 03/03/2021] clonazePAM (KLONOPIN) 0.5 MG tablet TAKE 1 TABLET BY MOUTH EVERY DAY AS NEEDED FOR ANXIETY 30 tablet 2  . diclofenac sodium (VOLTAREN) 1 % GEL APPLY TO AFFECTED AREA 1-3 TIMES A DAY FOR 30 DAYS  1  . doxycycline (VIBRAMYCIN) 100 MG capsule Take 1 capsule (100 mg total) by mouth 2 (two) times daily. (Patient not taking: Reported on 02/04/2021) 20 capsule 0  . EPINEPHrine (AUVI-Q) 0.3 mg/0.3 mL IJ SOAJ injection Use as directed for severe allergic reactions 2 each 1  . fluticasone (FLONASE) 50 MCG/ACT nasal spray 2 sprays per nostril  once daily if needed  for stuffy nose. 48 g 3  . ibuprofen (ADVIL,MOTRIN) 200 MG tablet Take 400 mg by mouth 3 (three) times daily  as needed. Reported on 11/05/2015    . lamoTRIgine (LAMICTAL) 150 MG tablet Take 2 tablets (300 mg total) by mouth daily. 180 tablet 1  . loratadine (CLARITIN) 10 MG tablet TAKE 1 TABLET BY MOUTH EVERY DAY 90 tablet 1  . Melatonin 3 MG TABS Take 3 mg by mouth at bedtime. Reported on 11/05/2015    . montelukast (SINGULAIR) 10 MG tablet Take 1 tablet (10 mg total) by mouth at bedtime. 90 tablet 3  . NON FORMULARY     . olopatadine (PATANOL) 0.1 % ophthalmic solution Place 1 drop into both eyes 2 (two) times daily. 5 mL 1  . Olopatadine HCl (PAZEO) 0.7 % SOLN Place 1 drop  into both eyes daily as needed. 7.5 mL 3  . omeprazole (PRILOSEC) 20 MG capsule TAKE 1 CAPSULE BY MOUTH EVERY DAY (Patient taking differently: Take 20 mg by mouth daily as needed.) 90 capsule 1  . perindopril (ACEON) 2 MG tablet Take 1 tablet (2 mg total) by mouth daily. 90 tablet 3  . sertraline (ZOLOFT) 100 MG tablet TAKE 2 TABLETS BY MOUTH EVERY DAY 180 tablet 0  . simvastatin (ZOCOR) 40 MG tablet Take 1 tablet (40 mg total) by mouth at bedtime. 90 tablet 3  . [START ON 03/03/2021] temazepam (RESTORIL) 15 MG capsule Take 1-2 caps po QHS 30 capsule 0   No current facility-administered medications for this visit.    Medication Side Effects: None  Allergies:  Allergies  Allergen Reactions  . Other Anaphylaxis    Shellfish-Anaphylaxis  . Codeine     nausea  . Dog Epithelium     Past Medical History:  Diagnosis Date  . Anxiety    takes Zoloft daily;takes Clonazepam daily as needed  . Arthritis   . Chronic back pain    stenosis  . Diarrhea    occasionally  . GERD (gastroesophageal reflux disease)    takes Omeprazole daily   . History of blood clots 10 yrs ago   left calf after a bone break  . History of bronchitis 2014  . History of colon polyps    benign  . Hyperlipidemia    takes Simvastatin daily  . Hypertension   . Insomnia    takes Melatonin nightly  . Joint pain   . Seasonal allergies    takes Claritin daily;Nasonex and ProAir as needed;Singulair daily  . Weakness    numbness and tingling in right leg    Family History  Problem Relation Age of Onset  . Cancer Mother   . Retinitis pigmentosa Father   . Depression Father   . Anxiety disorder Sister   . Melanoma Brother   . Anxiety disorder Brother   . Anorexia nervosa Brother   . Leukemia Niece   . Anorexia nervosa Cousin   . Allergic rhinitis Neg Hx   . Angioedema Neg Hx   . Asthma Neg Hx   . Eczema Neg Hx   . Immunodeficiency Neg Hx   . Urticaria Neg Hx   . Breast cancer Neg Hx     Social History    Socioeconomic History  . Marital status: Divorced    Spouse name: Not on file  . Number of children: Not on file  . Years of education: Not on file  . Highest education level: Not on file  Occupational History  . Not on file  Tobacco Use  . Smoking status: Never Smoker  . Smokeless tobacco: Never Used  Vaping Use  . Vaping Use:  Never used  Substance and Sexual Activity  . Alcohol use: Yes    Comment: 1-2 glasses of wine daily  . Drug use: No  . Sexual activity: Not on file  Other Topics Concern  . Not on file  Social History Narrative  . Not on file   Social Determinants of Health   Financial Resource Strain: Not on file  Food Insecurity: Not on file  Transportation Needs: Not on file  Physical Activity: Not on file  Stress: Not on file  Social Connections: Not on file  Intimate Partner Violence: Not on file    Past Medical History, Surgical history, Social history, and Family history were reviewed and updated as appropriate.   Please see review of systems for further details on the patient's review from today.   Objective:   Physical Exam:  There were no vitals taken for this visit.  Physical Exam Constitutional:      General: She is not in acute distress. Musculoskeletal:        General: No deformity.  Neurological:     Mental Status: She is alert and oriented to person, place, and time.     Coordination: Coordination normal.  Psychiatric:        Attention and Perception: Attention and perception normal. She does not perceive auditory or visual hallucinations.        Mood and Affect: Mood normal. Mood is not anxious or depressed. Affect is not labile, blunt, angry or inappropriate.        Speech: Speech normal.        Behavior: Behavior normal.        Thought Content: Thought content normal. Thought content is not paranoid or delusional. Thought content does not include homicidal or suicidal ideation. Thought content does not include homicidal or suicidal  plan.        Cognition and Memory: Cognition and memory normal.        Judgment: Judgment normal.     Comments: Insight intact     Lab Review:     Component Value Date/Time   NA 137 02/15/2020 1624   K 4.2 02/15/2020 1624   CL 104 02/15/2020 1624   CO2 26 02/15/2020 1624   GLUCOSE 94 02/15/2020 1624   BUN 14 02/15/2020 1624   CREATININE 0.78 02/15/2020 1624   CALCIUM 8.4 02/15/2020 1624   PROT 6.5 02/15/2020 1624   ALBUMIN 4.3 02/15/2020 1624   AST 17 02/15/2020 1624   ALT 18 02/15/2020 1624   ALKPHOS 121 (H) 02/15/2020 1624   BILITOT 0.3 02/15/2020 1624   GFRNONAA >90 10/03/2014 1027   GFRAA >90 10/03/2014 1027       Component Value Date/Time   WBC 7.5 02/15/2020 1624   RBC 4.52 02/15/2020 1624   HGB 13.3 02/15/2020 1624   HCT 39.5 02/15/2020 1624   PLT 272.0 02/15/2020 1624   MCV 87.4 02/15/2020 1624   MCH 29.2 10/03/2014 1027   MCHC 33.8 02/15/2020 1624   RDW 13.2 02/15/2020 1624    No results found for: POCLITH, LITHIUM   No results found for: PHENYTOIN, PHENOBARB, VALPROATE, CBMZ   .res Assessment: Plan:   Will continue current plan of care since target signs and symptoms are well controlled without any tolerability issues. Continue Klonopin as needed for anxiety. Continue lamotrigine 300 mg daily for mood signs and symptoms. Continue sertraline 200 mg daily for mood and anxiety signs and symptoms. Continue temazepam 15 mg 1 to 2 capsules at bedtime for insomnia.  Patient to follow-up in 3 months or sooner if clinically indicated. Patient advised to contact office with any questions, adverse effects, or acute worsening in signs and symptoms.   Ashley Craig was seen today for follow-up.  Diagnoses and all orders for this visit:  Generalized anxiety disorder -     clonazePAM (KLONOPIN) 0.5 MG tablet; TAKE 1 TABLET BY MOUTH EVERY DAY AS NEEDED FOR ANXIETY  Insomnia, unspecified type -     temazepam (RESTORIL) 15 MG capsule; Take 1-2 caps po QHS  Depression,  unspecified depression type     Please see After Visit Summary for patient specific instructions.  Future Appointments  Date Time Provider Department Center  05/06/2021  1:30 PM Corie Chiquito, PMHNP [facility] None    No orders of the defined types were placed in this encounter.   -------------------------------

## 2021-02-05 ENCOUNTER — Other Ambulatory Visit: Payer: Self-pay | Admitting: Family Medicine

## 2021-02-05 ENCOUNTER — Ambulatory Visit (INDEPENDENT_AMBULATORY_CARE_PROVIDER_SITE_OTHER): Payer: Medicare HMO | Admitting: *Deleted

## 2021-02-05 DIAGNOSIS — J309 Allergic rhinitis, unspecified: Secondary | ICD-10-CM

## 2021-02-11 ENCOUNTER — Ambulatory Visit (INDEPENDENT_AMBULATORY_CARE_PROVIDER_SITE_OTHER): Payer: Medicare HMO

## 2021-02-11 DIAGNOSIS — J309 Allergic rhinitis, unspecified: Secondary | ICD-10-CM | POA: Diagnosis not present

## 2021-02-20 ENCOUNTER — Ambulatory Visit (INDEPENDENT_AMBULATORY_CARE_PROVIDER_SITE_OTHER): Payer: Medicare HMO

## 2021-02-20 DIAGNOSIS — J309 Allergic rhinitis, unspecified: Secondary | ICD-10-CM

## 2021-02-24 ENCOUNTER — Other Ambulatory Visit: Payer: Self-pay | Admitting: Psychiatry

## 2021-02-24 DIAGNOSIS — F32A Depression, unspecified: Secondary | ICD-10-CM

## 2021-02-24 NOTE — Patient Instructions (Incomplete)
Allergic rhinitis Continue Claritin 10 mg-take 1 tablet once a day for runny nose or itchy eyes Continue Fluticasone 2 sprays per nostril once a day if needed for stuffy nose  Continue azelastine 0.1% - 2 sprays per nostril twice a day if needed for runny nose, drainage or sinus headache.  Mild persistent asthma  Continue montelukast 10 mg-take 1 tablet once a day to prevent coughing or wheezing ProAir 2 puffs every 4 hours if needed for wheezing or coughing spells.  You may use ProAir 2 puffs 5 to 15 minutes before exercise  Allergic conjunctivitis  Continue Zaditor 0.025% - 1 drop in each eye 3 times a day if needed for itchy eyes  Food allergy Avoid shellfish.  If you have an allergic reaction take Benadryl 50 mg every 4 hours and if you have life-threatening symptoms inject with Auvi-Q 0.3 mg  Call us if you are not doing well on this treatment plan Schedule a follow up appointment in

## 2021-02-25 ENCOUNTER — Ambulatory Visit: Payer: Self-pay | Admitting: Family

## 2021-02-28 ENCOUNTER — Ambulatory Visit (INDEPENDENT_AMBULATORY_CARE_PROVIDER_SITE_OTHER): Payer: Medicare HMO

## 2021-02-28 DIAGNOSIS — J309 Allergic rhinitis, unspecified: Secondary | ICD-10-CM

## 2021-03-06 NOTE — Patient Instructions (Addendum)
Allergic rhinitis Continue Claritin 10 mg-take 1 tablet once a day for runny nose or itchy eyes Continue Fluticasone 2 sprays per nostril once a day if needed for stuffy nose.  Try using this more consistently when your symptoms are worse  Continue azelastine 0.1% - 2 sprays per nostril twice a day if needed for runny nose, drainage or sinus headache.  Try using this more consistently when your symptoms are worse  Mild persistent asthma  Continue montelukast 10 mg-take 1 tablet once a day to prevent coughing or wheezing ProAir 2 puffs every 4 hours if needed for wheezing or coughing spells.  You may use ProAir 2 puffs 5 to 15 minutes before exercise  Allergic conjunctivitis  Continue your current eyedrops that you have.  Call us if you would like [place] to send in a prescription for something else  Food allergy Avoid shellfish.  If you have an allergic reaction take Benadryl 50 mg every 4 hours and if you have life-threatening symptoms inject with EpiPen 0.3 mg  Keep your appointment at the end of this month to discuss the redness/burning on your scalp  Call us if you are not doing well on this treatment plan Schedule a follow up appointment in 1 year or sooner if needed  Control of Dog or Cat Allergen Avoidance is the best way to manage a dog or cat allergy. If you have a dog or cat and are allergic to dog or cats, consider removing the dog or cat from the home. If you have a dog or cat but don't want to find it a new home, or if your family wants a pet even though someone in the household is allergic, here are some strategies that may help keep symptoms at bay:  1. Keep the pet out of your bedroom and restrict it to only a few rooms. Be advised that keeping the dog or cat in only one room will not limit the allergens to that room. 2. Don't pet, hug or kiss the dog or cat; if you do, wash your hands with soap and water. 3. High-efficiency particulate air (HEPA) cleaners run continuously in a  bedroom or living room can reduce allergen levels over time. 4. Regular use of a high-efficiency vacuum cleaner or a central vacuum can reduce allergen levels. 5. Giving your dog or cat a bath at least once a week can reduce airborne allergen.  Reducing Pollen Exposure The American Academy of Allergy, Asthma and Immunology suggests the following steps to reduce your exposure to pollen during allergy seasons. 6. Do not hang sheets or clothing out to dry; pollen may collect on these items. 7. Do not mow lawns or spend time around freshly cut grass; mowing stirs up pollen. 8. Keep windows closed at night.  Keep car windows closed while driving. 9. Minimize morning activities outdoors, a time when pollen counts are usually at their highest. 10. Stay indoors as much as possible when pollen counts or humidity is high and on windy days when pollen tends to remain in the air longer. 11. Use air conditioning when possible.  Many air conditioners have filters that trap the pollen spores. 12. Use a HEPA room air filter to remove pollen form the indoor air you breathe.

## 2021-03-07 ENCOUNTER — Other Ambulatory Visit: Payer: Self-pay

## 2021-03-07 ENCOUNTER — Ambulatory Visit: Payer: Medicare HMO | Admitting: Family

## 2021-03-07 ENCOUNTER — Encounter: Payer: Self-pay | Admitting: Family

## 2021-03-07 VITALS — BP 130/68 | HR 78 | Temp 98.4°F | Resp 20 | Ht 67.0 in | Wt 194.0 lb

## 2021-03-07 DIAGNOSIS — J4530 Mild persistent asthma, uncomplicated: Secondary | ICD-10-CM

## 2021-03-07 DIAGNOSIS — H1013 Acute atopic conjunctivitis, bilateral: Secondary | ICD-10-CM

## 2021-03-07 DIAGNOSIS — J301 Allergic rhinitis due to pollen: Secondary | ICD-10-CM

## 2021-03-07 DIAGNOSIS — J309 Allergic rhinitis, unspecified: Secondary | ICD-10-CM | POA: Diagnosis not present

## 2021-03-07 DIAGNOSIS — T7800XD Anaphylactic reaction due to unspecified food, subsequent encounter: Secondary | ICD-10-CM

## 2021-03-07 DIAGNOSIS — H1010 Acute atopic conjunctivitis, unspecified eye: Secondary | ICD-10-CM

## 2021-03-07 MED ORDER — AZELASTINE HCL 0.1 % NA SOLN
NASAL | 5 refills | Status: DC
Start: 2021-03-07 — End: 2022-06-01

## 2021-03-07 MED ORDER — EPINEPHRINE 0.3 MG/0.3ML IJ SOAJ: 0.3000 mg | INTRAMUSCULAR | 1 refills | Status: DC | PRN

## 2021-03-07 MED ORDER — MONTELUKAST SODIUM 10 MG PO TABS
10.0000 mg | ORAL_TABLET | Freq: Every day | ORAL | 3 refills | Status: DC
Start: 2021-03-07 — End: 2022-04-20

## 2021-03-07 NOTE — Progress Notes (Addendum)
[address] [place] [address] Dept: [id number]  FOLLOW UP NOTE  Patient ID: Ashley Craig, female    DOB: [birth year]  Age: 69 y.o. MRN: [id number] Date of Office Visit: 03/07/2021  Assessment  Chief Complaint: Allergies  HPI Ashley Craig is a 70 year old female who presents today for follow-up of seasonal allergic rhinitis due to pollen, mild persistent asthma without complication, anaphylactic shock due to food, and seasonal allergic conjunctivitis.  She was last seen on January 22, 2020 by Dr. Beaulah Dinning.  Seasonal allergic rhinitis is reported as moderately controlled with Claritin 10 mg once a day, fluticasone nasal spray as needed, azelastine nasal spray as needed, and allergy injections.  She reports clear rhinorrhea and postnasal drip.  She denies nasal congestion and sinus pressure/tenderness.  She reports that she got a dog 3 days prior to her previous skin testing and did not realize she was allergic to the dog.  She feels like her dog may be causing a lot of her symptoms.  The dog does not sleep in her room.  She is not certain if her allergy injections are helping.  She denies any large local reactions.  She has tried other antihistamines such as Zyrtec and it does not phase her.  Allegra knocks her out.  She did notice a couple days last week that she blew a little bit of blood from her nose.  She held off from using her nasal sprays and this helped.  Allergic conjunctivitis is reported as moderately controlled with an over-the-counter eyedrop from [facility].  She reports itchy watery eyes that is worse at certain times of the year.  She reports that she has tried other eyedrops in the past and the last eyedrop she used did not work.  She would like to continue on her current eyedrop that she is using for now.  Mild persistent asthma is reported as controlled with montelukast 10 mg once a day and albuterol as needed.  She reports coughing due to drainage and denies  wheezing, tightness in her chest, shortness of breath, and nocturnal awakenings.  Since her last office visit she has not required any systemic steroids or made any trips to the emergency room or urgent care due to breathing problems.  She continues to avoid shellfish without any accidental ingestion or use of her EpiPen.  She did just recently realized that her EpiPen is out of date and is needing a refill.   Drug Allergies:  Allergies  Allergen Reactions   Other Anaphylaxis    Shellfish-Anaphylaxis   Codeine     nausea   Dog Epithelium     Review of Systems: Review of Systems  Constitutional: Negative for chills and fever.  HENT:       Reports clear rhinorrhea postnasal drip.  Denies nasal congestion and sinus tenderness/pressure  Eyes:       Reports ocular pruritus  Respiratory: Positive for cough. Negative for shortness of breath and wheezing.        Reports cough in the morning due to postnasal drip  Cardiovascular: Negative for chest pain and palpitations.  Gastrointestinal:       Reports history of reflux for which she takes omeprazole as needed  Genitourinary: Negative for dysuria.  Skin: Negative for itching and rash.       She reports that she recently has been having some redness and pain at the scalp where the part in her hair is.  She has an appointment  at the end of this month with her dermatologist and will discuss this  Neurological: Negative for headaches.  Endo/Heme/Allergies: Positive for environmental allergies.    Physical Exam: BP 130/68   Pulse 78   Temp 98.4 F (36.9 C) (Temporal)   Resp 20   Ht 5\' 7"  (1.702 m)   Wt 194 lb (88 kg)   SpO2 97%   BMI 30.38 kg/m    Physical Exam Constitutional:      Appearance: Normal appearance.  HENT:     Head: Normocephalic and atraumatic.     Comments: Pharynx normal, eyes normal, ears normal, nose slightly erythematous with no drainage noted    Right Ear: Tympanic membrane, ear canal and external ear  normal.     Left Ear: Tympanic membrane, ear canal and external ear normal.     Mouth/Throat:     Mouth: Mucous membranes are moist.     Pharynx: Oropharynx is clear.  Eyes:     Conjunctiva/sclera: Conjunctivae normal.  Cardiovascular:     Rate and Rhythm: Regular rhythm.     Heart sounds: Normal heart sounds.  Pulmonary:     Effort: Pulmonary effort is normal.     Breath sounds: Normal breath sounds.     Comments: Lungs clear to auscultation Musculoskeletal:     Cervical back: Neck supple.  Skin:    Comments: Small area flaking of skin noted in partline of hair with mild erythema. No oozing noted  Neurological:     Mental Status: She is alert and oriented to person, place, and time.  Psychiatric:        Mood and Affect: Mood normal.        Behavior: Behavior normal.        Thought Content: Thought content normal.        Judgment: Judgment normal.     Diagnostics: FVC 2.95 L, FEV1 2.07 L.  Predicted FVC 3.25 L, FEV1 2.51 L.  Spirometry indicates normal respiratory function.  Assessment and Plan: 1. Seasonal allergic rhinitis due to pollen   2. Seasonal allergic conjunctivitis   3. Mild persistent asthma without complication   4. Anaphylactic shock due to food, subsequent encounter     Meds ordered this encounter  Medications   EPINEPHrine (EPIPEN 2-PAK) 0.3 mg/0.3 mL IJ SOAJ injection    Sig: Inject 0.3 mg into the muscle as needed for anaphylaxis.    Dispense:  2 each    Refill:  1   azelastine (ASTELIN) 0.1 % nasal spray    Sig: TWO SPRAYS IN EACH NOSTRIL TWICE A DAY IF NEEDED    Dispense:  30 mL    Refill:  5   montelukast (SINGULAIR) 10 MG tablet    Sig: Take 1 tablet (10 mg total) by mouth at bedtime.    Dispense:  90 tablet    Refill:  3    On hold, pt will call.    Patient Instructions  Allergic rhinitis Continue Claritin 10 mg-take 1 tablet once a day for runny nose or itchy eyes Continue Fluticasone 2 sprays per nostril once a day if needed for  stuffy nose.  Try using this more consistently when your symptoms are worse  Continue azelastine 0.1% - 2 sprays per nostril twice a day if needed for runny nose, drainage or sinus headache.  Try using this more consistently when your symptoms are worse  Mild persistent asthma  Continue montelukast 10 mg-take 1 tablet once a day to prevent coughing or wheezing  ProAir 2 puffs every 4 hours if needed for wheezing or coughing spells.  You may use ProAir 2 puffs 5 to 15 minutes before exercise  Allergic conjunctivitis  Continue your current eyedrops that you have.  Call us if you would like [place] to send in a prescription for something else  Food allergy Avoid shellfish.  If you have an allergic reaction take Benadryl 50 mg every 4 hours and if you have life-threatening symptoms inject with EpiPen 0.3 mg  Keep your appointment at the end of this month to discuss the redness/burning on your scalp  Call us if you are not doing well on this treatment plan Schedule a follow up appointment in 1 year or sooner if needed  Control of Dog or Cat Allergen Avoidance is the best way to manage a dog or cat allergy. If you have a dog or cat and are allergic to dog or cats, consider removing the dog or cat from the home. If you have a dog or cat but don't want to find it a new home, or if your family wants a pet even though someone in the household is allergic, here are some strategies that may help keep symptoms at bay:  Keep the pet out of your bedroom and restrict it to only a few rooms. Be advised that keeping the dog or cat in only one room will not limit the allergens to that room. Don't pet, hug or kiss the dog or cat; if you do, wash your hands with soap and water. High-efficiency particulate air (HEPA) cleaners run continuously in a bedroom or living room can reduce allergen levels over time. Regular use of a high-efficiency vacuum cleaner or a central vacuum can reduce allergen levels. Giving your dog  or cat a bath at least once a week can reduce airborne allergen.  Reducing Pollen Exposure The American Academy of Allergy, Asthma and Immunology suggests the following steps to reduce your exposure to pollen during allergy seasons. Do not hang sheets or clothing out to dry; pollen may collect on these items. Do not mow lawns or spend time around freshly cut grass; mowing stirs up pollen. Keep windows closed at night.  Keep car windows closed while driving. Minimize morning activities outdoors, a time when pollen counts are usually at their highest. Stay indoors as much as possible when pollen counts or humidity is high and on windy days when pollen tends to remain in the air longer. Use air conditioning when possible.  Many air conditioners have filters that trap the pollen spores. Use a HEPA room air filter to remove pollen form the indoor air you breathe.      Return in about 1 year (around 03/07/2022), or if symptoms worsen or fail to improve.    Thank you for the opportunity to care for this patient.  Please do not hesitate to contact me with questions.  Nehemiah Settle, FNP [facility]  I have provided oversight concerning evaluation and treatment of this patient's health issues addressed during today's encounter. I agree with the assessment and therapeutic plan as outlined in the note.   Signed,   Jessica Priest, MD,  Allergy and Immunology,  [facility] [place].

## 2021-03-13 ENCOUNTER — Ambulatory Visit (INDEPENDENT_AMBULATORY_CARE_PROVIDER_SITE_OTHER): Payer: Medicare HMO

## 2021-03-13 DIAGNOSIS — J309 Allergic rhinitis, unspecified: Secondary | ICD-10-CM

## 2021-03-21 ENCOUNTER — Ambulatory Visit (INDEPENDENT_AMBULATORY_CARE_PROVIDER_SITE_OTHER): Payer: Medicare HMO

## 2021-03-21 DIAGNOSIS — J309 Allergic rhinitis, unspecified: Secondary | ICD-10-CM

## 2021-04-02 ENCOUNTER — Ambulatory Visit (INDEPENDENT_AMBULATORY_CARE_PROVIDER_SITE_OTHER): Payer: Medicare HMO

## 2021-04-02 DIAGNOSIS — J309 Allergic rhinitis, unspecified: Secondary | ICD-10-CM

## 2021-04-09 ENCOUNTER — Ambulatory Visit (INDEPENDENT_AMBULATORY_CARE_PROVIDER_SITE_OTHER): Payer: Medicare HMO

## 2021-04-09 DIAGNOSIS — J309 Allergic rhinitis, unspecified: Secondary | ICD-10-CM | POA: Diagnosis not present

## 2021-04-15 ENCOUNTER — Other Ambulatory Visit: Payer: Self-pay | Admitting: Family Medicine

## 2021-04-17 ENCOUNTER — Ambulatory Visit (INDEPENDENT_AMBULATORY_CARE_PROVIDER_SITE_OTHER): Payer: Medicare HMO

## 2021-04-17 DIAGNOSIS — J309 Allergic rhinitis, unspecified: Secondary | ICD-10-CM | POA: Diagnosis not present

## 2021-04-23 ENCOUNTER — Encounter: Payer: Self-pay | Admitting: Family Medicine

## 2021-04-23 DIAGNOSIS — M542 Cervicalgia: Secondary | ICD-10-CM

## 2021-04-23 DIAGNOSIS — G8929 Other chronic pain: Secondary | ICD-10-CM

## 2021-04-23 DIAGNOSIS — M25519 Pain in unspecified shoulder: Secondary | ICD-10-CM

## 2021-04-24 ENCOUNTER — Other Ambulatory Visit: Payer: Self-pay | Admitting: Family Medicine

## 2021-04-25 ENCOUNTER — Ambulatory Visit (INDEPENDENT_AMBULATORY_CARE_PROVIDER_SITE_OTHER): Payer: Medicare HMO

## 2021-04-25 DIAGNOSIS — J309 Allergic rhinitis, unspecified: Secondary | ICD-10-CM

## 2021-04-30 ENCOUNTER — Ambulatory Visit (INDEPENDENT_AMBULATORY_CARE_PROVIDER_SITE_OTHER): Payer: Medicare HMO

## 2021-04-30 DIAGNOSIS — J309 Allergic rhinitis, unspecified: Secondary | ICD-10-CM

## 2021-05-06 ENCOUNTER — Telehealth: Payer: Medicare HMO | Admitting: Psychiatry

## 2021-05-09 ENCOUNTER — Ambulatory Visit (INDEPENDENT_AMBULATORY_CARE_PROVIDER_SITE_OTHER): Payer: Medicare HMO | Admitting: *Deleted

## 2021-05-09 DIAGNOSIS — J309 Allergic rhinitis, unspecified: Secondary | ICD-10-CM

## 2021-05-17 ENCOUNTER — Other Ambulatory Visit: Payer: Self-pay | Admitting: Family Medicine

## 2021-05-19 ENCOUNTER — Ambulatory Visit (INDEPENDENT_AMBULATORY_CARE_PROVIDER_SITE_OTHER): Payer: Medicare HMO | Admitting: *Deleted

## 2021-05-19 DIAGNOSIS — J309 Allergic rhinitis, unspecified: Secondary | ICD-10-CM

## 2021-05-26 ENCOUNTER — Other Ambulatory Visit: Payer: Self-pay | Admitting: Psychiatry

## 2021-05-26 DIAGNOSIS — F32A Depression, unspecified: Secondary | ICD-10-CM

## 2021-05-27 ENCOUNTER — Other Ambulatory Visit: Payer: Self-pay | Admitting: Psychiatry

## 2021-05-27 DIAGNOSIS — F32A Depression, unspecified: Secondary | ICD-10-CM

## 2021-05-27 DIAGNOSIS — F411 Generalized anxiety disorder: Secondary | ICD-10-CM

## 2021-05-29 ENCOUNTER — Ambulatory Visit (INDEPENDENT_AMBULATORY_CARE_PROVIDER_SITE_OTHER): Payer: Medicare HMO

## 2021-05-29 DIAGNOSIS — J309 Allergic rhinitis, unspecified: Secondary | ICD-10-CM

## 2021-06-06 ENCOUNTER — Ambulatory Visit (INDEPENDENT_AMBULATORY_CARE_PROVIDER_SITE_OTHER): Payer: Medicare HMO

## 2021-06-06 DIAGNOSIS — J309 Allergic rhinitis, unspecified: Secondary | ICD-10-CM

## 2021-06-09 ENCOUNTER — Telehealth: Payer: Medicare HMO | Admitting: Physician Assistant

## 2021-06-09 ENCOUNTER — Encounter: Payer: Self-pay | Admitting: Physician Assistant

## 2021-06-09 DIAGNOSIS — U071 COVID-19: Secondary | ICD-10-CM | POA: Diagnosis not present

## 2021-06-09 MED ORDER — MOLNUPIRAVIR EUA 200MG CAPSULE: 4.0000 | ORAL_CAPSULE | Freq: Two times a day (BID) | ORAL | 0 refills | Status: AC

## 2021-06-09 MED ORDER — ONDANSETRON HCL 4 MG PO TABS: 4.0000 mg | ORAL_TABLET | Freq: Three times a day (TID) | ORAL | 0 refills | Status: DC | PRN

## 2021-06-09 MED ORDER — BENZONATATE 100 MG PO CAPS: 100.0000 mg | ORAL_CAPSULE | Freq: Three times a day (TID) | ORAL | 0 refills | Status: DC | PRN

## 2021-06-09 NOTE — Patient Instructions (Signed)
Hello Ashley Craig,  You are being placed in the home monitoring program for COVID-19 (commonly known as Coronavirus).  This is because you are suspected to have the virus or are known to have the virus.  If you are unsure which group you fall into call your clinic.    As part of this program, you'll answer a daily questionnaire in the MyChart mobile app. You'll receive a notification through the MyChart app when the questionnaire is available. When you log in to MyChart, you'll see the tasks in your To Do activity.       Clinicians will see any answers that are concerning and take appropriate steps.  If at any point you are having a medical emergency, call 911.  If otherwise concerned call your clinic instead of coming into the clinic or hospital.  To keep from spreading the disease you should: Stay home and limit contact with other people as much as possible.  Wash your hands frequently. Cover your coughs and sneezes with a tissue, and throw used tissues in the trash.   Clean and disinfect frequently touched surfaces and objects.    Take care of yourself by: Staying home Resting Drinking fluids Take fever-reducing medications (Tylenol/Acetaminophen and Ibuprofen)  For more information on the disease go to the Centers for Disease Control and Prevention website     COVID-19: What to Do if You Are Sick CDC has updated isolation and quarantine recommendations for the public, and is revising the CDC website to reflect these changes. These recommendations do not apply to healthcare personnel and do not supersede state, local, tribal, or territorial laws, rules, andregulations. If you have a fever, cough or other symptoms, you might have COVID-19. Most people have mild illness and are able to recover at home. If you are sick: Keep track of your symptoms. If you have an emergency warning sign (including trouble breathing), call 911. Steps to help prevent the spread of COVID-19 if you are sick If you  are sick with COVID-19 or think you might have COVID-19, follow the steps below to care for yourself and to help protect other peoplein your home and community. Stay home except to get medical care Stay home. Most people with COVID-19 have mild illness and can recover at home without medical care. Do not leave your home, except to get medical care. Do not visit public areas. Take care of yourself. Get rest and stay hydrated. Take over-the-counter medicines, such as acetaminophen, to help you feel better. Stay in touch with your doctor. Call before you get medical care. Be sure to get care if you have trouble breathing, or have any other emergency warning signs, or if you think it is an emergency. Avoid public transportation, ride-sharing, or taxis. Separate yourself from other people As much as possible, stay in a specific room and away from other people and pets in your home. If possible, you should use a separate bathroom. If you need to be around other people or animals in oroutside of the home, wear a mask. Tell your close contactsthat they may have been exposed to COVID-19. An infected person can spread COVID-19 starting 48 hours (or 2 days) before the person has any symptoms or tests positive. By letting your close contacts know they may have been exposed to COVID-19, you are helping to protect everyone. Additional guidance is available for those living in close quarters and shared housing. See COVID-19 and Animals if you have questions about pets. If you are diagnosed with   COVID-19, someone from the health department may call you. Answer the call to slow the spread. Monitor your symptoms Symptoms of COVID-19 include fever, cough, or other symptoms. Follow care instructions from your healthcare provider and local health department. Your local health authorities may give instructions on checking your symptoms and reporting information. When to seek emergency medical attention Look for emergency  warning signs* for COVID-19. If someone is showing any of these signs, seek emergency medical care immediately: Trouble breathing Persistent pain or pressure in the chest New confusion Inability to wake or stay awake Pale, gray, or blue-colored skin, lips, or nail beds, depending on skin tone *This list is not all possible symptoms. Please call your medical provider forany other symptoms that are severe or concerning to you. Call 911 or call ahead to your local emergency facility: Notify the operator that you are seeking care for someone who has or may haveCOVID-19. Call ahead before visiting your doctor Call ahead. Many medical visits for routine care are being postponed or done by phone or telemedicine. If you have a medical appointment that cannot be postponed, call your doctor's office, and tell them you have or may have COVID-19. This will help the office protect themselves and other patients. Get tested If you have symptoms of COVID-19, get tested. While waiting for test results, you stay away from others, including staying apart from those living in your household. Self-tests are one of several options for testing for the virus that causes COVID-19 and may be more convenient than laboratory-based tests and point-of-care tests. Ask your healthcare provider or your local health department if you need help interpreting your test results. You can visit your state, tribal, local, and territorial health department's website to look for the latest local information on testing sites. If you are sick, wear a mask over your nose and mouth You should wear a mask over your nose and mouth if you must be around other people or animals, including pets (even at home). You don't need to wear the mask if you are alone. If you can't put on a mask (because of trouble breathing, for example), cover your coughs and sneezes in some other way. Try to stay at least 6 feet away from other people. This will help  protect the people around you. Masks should not be placed on young children under age 2 years, anyone who has trouble breathing, or anyone who is not able to remove the mask without help. Note: During the COVID-19 pandemic, medical grade facemasks are reserved forhealthcare workers and some first responders. Cover your coughs and sneezes Cover your mouth and nose with a tissue when you cough or sneeze. Throw away used tissues in a lined trash can. Immediately wash your hands with soap and water for at least 20 seconds. If soap and water are not available, clean your hands with an alcohol-based hand sanitizer that contains at least 60% alcohol. Clean your hands often Wash your hands often with soap and water for at least 20 seconds. This is especially important after blowing your nose, coughing, or sneezing; going to the bathroom; and before eating or preparing food. Use hand sanitizer if soap and water are not available. Use an alcohol-based hand sanitizer with at least 60% alcohol, covering all surfaces of your hands and rubbing them together until they feel dry. Soap and water are the best option, especially if hands are visibly dirty. Avoid touching your eyes, nose, and mouth with unwashed hands. Handwashing Tips Avoid sharing   personal household items Do not share dishes, drinking glasses, cups, eating utensils, towels, or bedding with other people in your home. Wash these items thoroughly after using them with soap and water or put in the dishwasher. Clean all "high-touch" surfaces every day Clean and disinfect high-touch surfaces in your "sick room" and bathroom; wear disposable gloves. Let someone else clean and disinfect surfaces in common areas, but you should clean your bedroom and bathroom, if possible. If a caregiver or other person needs to clean and disinfect a sick person's bedroom or bathroom, they should do so on an as-needed basis. The caregiver/other person should wear a mask and  disposable gloves prior to cleaning. They should wait as long as possible after the person who is sick has used the bathroom before coming in to clean and use the bathroom. High-touch surfaces include phones, remote controls, counters, tabletops, doorknobs, bathroom fixtures, toilets, keyboards, tablets, and bedside tables. Clean and disinfect areas that may have blood, stool, or body fluids on them. Use household cleaners and disinfectants. Clean the area or item with soap and water or another detergent if it is dirty. Then, use a household disinfectant. Be sure to follow the instructions on the label to ensure safe and effective use of the product. Many products recommend keeping the surface wet for several minutes to ensure germs are killed. Many also recommend precautions such as wearing gloves and making sure you have good ventilation during use of the product. Use a product from EPA's List N: Disinfectants for Coronavirus (COVID-19). Complete Disinfection Guidance When you can be around others after being sick with COVID-19 Deciding when you can be around others is different for different situations. Find out when you can safely end home isolation. For any additional questions about your care,contact your healthcare provider or state or local health department. 10/09/2020 Content source: National Center for Immunization and Respiratory Diseases (NCIRD), Division of Viral Diseases This information is not intended to replace advice given to you by your health care provider. Make sure you discuss any questions you have with your healthcare provider. Document Revised: 12/06/2020 Document Reviewed: 12/06/2020 Elsevier Patient Education  2022 Elsevier Inc.   You are being prescribed MOLNUPIRAVIR for COVID-19 infection.   Please call the pharmacy or go through the drive through vs going inside if you are picking up the mediation yourself to prevent further spread. If prescribed to a [facility]  affiliated pharmacy, a pharmacist will bring the medication out to your car.   ADMINISTRATION INSTRUCTIONS: Take with or without food. Swallow the tablets whole. Don't chew, crush, or break the medications because it might not work as well  For each dose of the medication, you should be taking FOUR tablets at one time, TWICE a day   Finish your full five-day course of Molnupiravir even if you feel better before you're done. Stopping this medication too early can make it less effective to prevent severe illness related to COVID19.    Molnupiravir is prescribed for YOU ONLY. Don't share it with others, even if they have similar symptoms as you. This medication might not be right for everyone.   Make sure to take steps to protect yourself and others while you're taking this medication in order to get well soon and to prevent others from getting sick with COVID-19.   **If you are of childbearing potential (any gender) - it is advised to not get pregnant while taking this medication and recommended that condoms are used for female partners the   next 3 months after taking the medication out of extreme caution    COMMON SIDE EFFECTS: Diarrhea Nausea  Dizziness    If your COVID-19 symptoms get worse, get medical help right away. Call 911 if you experience symptoms such as worsening cough, trouble breathing, chest pain that doesn't go away, confusion, a hard time staying awake, and pale or blue-colored skin. This medication won't prevent all COVID-19 cases from getting worse.   Can take to lessen severity: Vit C 500mg twice daily Quercertin 250-500mg twice daily Zinc 75-100mg daily Melatonin 3-6 mg at bedtime Vit D3 1000-2000 IU daily Aspirin 81 mg daily with food Optional: Famotidine 20mg daily Also can add tylenol/ibuprofen as needed for fevers and body aches May add Mucinex or Mucinex DM as needed for cough/congestion  10 Things You Can Do to Manage Your COVID-19 Symptoms at Home If  you have possible or confirmed COVID-19 Stay home except to get medical care. Monitor your symptoms carefully. If your symptoms get worse, call your healthcare provider immediately. Get rest and stay hydrated. If you have a medical appointment, call the healthcare provider ahead of time and tell them that you have or may have COVID-19. For medical emergencies, call 911 and notify the dispatch personnel that you have or may have COVID-19. Cover your cough and sneezes with a tissue or use the inside of your elbow. Wash your hands often with soap and water for at least 20 seconds or clean your hands with an alcohol-based hand sanitizer that contains at least 60% alcohol. As much as possible, stay in a specific room and away from other people in your home. Also, you should use a separate bathroom, if available. If you need to be around other people in or outside of the home, wear a mask. Avoid sharing personal items with other people in your household, like dishes, towels, and bedding. Clean all surfaces that are touched often, like counters, tabletops, and doorknobs. Use household cleaning sprays or wipes according to the label instructions. cdc.gov/coronavirus 05/17/2020 This information is not intended to replace advice given to you by your health care provider. Make sure you discuss any questions you have with your healthcare provider. Document Revised: 12/06/2020 Document Reviewed: 12/06/2020 Elsevier Patient Education  2022 Elsevier Inc.  

## 2021-06-09 NOTE — Progress Notes (Signed)
Virtual Visit Consent   Ashley Craig, you are scheduled for a virtual visit with a [facility] provider today.     Just as with appointments in the office, your consent must be obtained to participate.  Your consent will be active for this visit and any virtual visit you may have with one of our providers in the next 365 days.     If you have a MyChart account, a copy of this consent can be sent to you electronically.  All virtual visits are billed to your insurance company just like a traditional visit in the office.    As this is a virtual visit, video technology does not allow for your provider to perform a traditional examination.  This may limit your provider's ability to fully assess your condition.  If your provider identifies any concerns that need to be evaluated in person or the need to arrange testing (such as labs, EKG, etc.), we will make arrangements to do so.     Although advances in technology are sophisticated, we cannot ensure that it will always work on either your end or our end.  If the connection with a video visit is poor, the visit may have to be switched to a telephone visit.  With either a video or telephone visit, we are not always able to ensure that we have a secure connection.     I need to obtain your verbal consent now.   Are you willing to proceed with your visit today?    Ashley Craig has provided verbal consent on 06/09/2021 for a virtual visit (video or telephone).   Margaretann Loveless, PA-C   Date: 06/09/2021 1:58 PM   Virtual Visit via Video Note   I, Margaretann Loveless, connected with  Ashley Craig  ([id number], 11-19-1950) on 06/09/21 at  2:15 PM EDT by a video-enabled telemedicine application and verified that I am speaking with the correct person using two identifiers.  Location: Patient: Virtual Visit Location Patient: Home Provider: Virtual Visit Location Provider: Home Office   I discussed the limitations of evaluation and management by  telemedicine and the availability of in person appointments. The patient expressed understanding and agreed to proceed.    History of Present Illness: Ashley Craig is a 70 y.o. who identifies as a female who was assigned female at birth, and is being seen today for covid 19, tested positive today, 06/09/21.  HPI: URI  This is a new problem. The current episode started yesterday. The problem has been unchanged. There has been no fever (99.4-99.6). Associated symptoms include congestion, ear pain, headaches, nausea, rhinorrhea, sinus pain and a sore throat. Pertinent negatives include no coughing, plugged ear sensation or sneezing. Associated symptoms comments: Fatigue, chills, post nasal drainge. She has tried nothing for the symptoms.     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   Seasonal allergic conjunctivitis 05/09/2018   Impingement syndrome of left ankle 07/12/2017   Diarrhea 05/21/2017   Gastroesophageal reflux disease with esophagitis 05/21/2017   History of colon polyps 05/21/2017   Orthostatic hypotension 04/08/2017   Chronic pain of left ankle 03/30/2017   Unilateral primary osteoarthritis, left knee 11/13/2016   Allergic dermatitis 08/12/2016   Chronic dryness of both eyes 06/02/2016   Hypermetropia of both eyes 06/02/2016   Open angle with borderline findings  and low glaucoma risk in both eyes 06/02/2016   Pinguecula of both eyes 06/02/2016   Presbyopia of both eyes 06/02/2016   Regular astigmatism of both eyes 06/02/2016   Cellulitis of right lower extremity 01/30/2016   Varicose veins of left lower extremity with pain 01/16/2016   Mild persistent asthma without complication 11/05/2015   Chronic venous insufficiency 08/07/2015   Spontaneous hematoma of lower leg 11/30/2014   Spinal stenosis of lumbar region 10/11/2014    Allergic rhinitis 04/27/2014   Adjustment disorder 04/27/2014   Benign essential hypertension 04/27/2014   Benign paroxysmal vertigo 04/27/2014   Chondrocostal junction syndrome 04/27/2014   Elevation of level of transaminase or lactic acid dehydrogenase (LDH) 04/27/2014   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   Overweight 04/27/2014   Spontaneous ecchymosis 04/27/2014   Tendonitis of shoulder 04/27/2014   Gastroesophageal reflux disease 03/27/2014   Anxiety 03/27/2014   Environmental allergies 03/27/2014   Hypercholesteremia 03/27/2014   Insomnia 03/27/2014   Vitamin D deficiency 03/27/2014    Allergies:  Allergies  Allergen Reactions   Other Anaphylaxis    Shellfish-Anaphylaxis   Codeine     nausea   Dog Epithelium    Medications:  Current Outpatient Medications:    albuterol (PROVENTIL) (2.5 MG/3ML) 0.083% nebulizer solution, TAKE 3 MLS BY NEBULIZATION EVERY 6 (SIX) HOURS AS NEEDED FOR WHEEZING OR SHORTNESS OF BREATH., Disp: 75 mL, Rfl: 1   albuterol (VENTOLIN HFA) 108 (90 Base) MCG/ACT inhaler, INHALE 2 PUFFS INTO THE LUNGS EVERY 4 (FOUR) HOURS AS NEEDED FOR WHEEZING OR SHORTNESS OF BREATH., Disp: 18 g, Rfl: 0   azelastine (ASTELIN) 0.1 % nasal spray, TWO SPRAYS IN EACH NOSTRIL TWICE A DAY IF NEEDED, Disp: 30 mL, Rfl: 5   clobetasol (TEMOVATE) 0.05 % external solution, Apply 1 application topically 2 (two) times daily as needed., Disp: , Rfl:    clonazePAM (KLONOPIN) 0.5 MG tablet, TAKE 1 TABLET BY MOUTH EVERY DAY AS NEEDED FOR ANXIETY, Disp: 30 tablet, Rfl: 2   diclofenac sodium (VOLTAREN) 1 % GEL, APPLY TO AFFECTED AREA 1-3 TIMES A DAY FOR 30 DAYS, Disp: , Rfl: 1   EPINEPHrine (EPIPEN 2-PAK) 0.3 mg/0.3 mL IJ SOAJ injection, Inject 0.3 mg into the muscle as needed for anaphylaxis., Disp: 2 each, Rfl: 1   fluticasone (FLONASE) 50 MCG/ACT nasal spray, 2 sprays per nostril  once daily if needed  for stuffy nose., Disp: 48 g, Rfl: 3   ibuprofen (ADVIL,MOTRIN) 200 MG tablet, Take  400 mg by mouth 3 (three) times daily as needed. Reported on 11/05/2015, Disp: , Rfl:    lamoTRIgine (LAMICTAL) 150 MG tablet, TAKE 2 TABLETS BY MOUTH DAILY., Disp: 180 tablet, Rfl: 0   loratadine (CLARITIN) 10 MG tablet, TAKE 1 TABLET BY MOUTH EVERY DAY, Disp: 90 tablet, Rfl: 1   Melatonin 3 MG TABS, Take 3 mg by mouth at bedtime. Reported on 11/05/2015, Disp: , Rfl:    montelukast (SINGULAIR) 10 MG tablet, Take 1 tablet (10 mg total) by mouth at bedtime., Disp: 90 tablet, Rfl: 3   NON FORMULARY, , Disp: , Rfl:    omeprazole (PRILOSEC) 20 MG capsule, TAKE 1 CAPSULE BY MOUTH EVERY DAY (Patient taking differently: Take 20 mg by mouth daily as needed.), Disp: 90 capsule, Rfl: 1   perindopril (ACEON) 2 MG tablet, Take 1 tablet (2 mg total) by mouth daily., Disp: 90 tablet, Rfl: 3   sertraline (ZOLOFT) 100 MG tablet, TAKE 2 TABLETS BY MOUTH EVERY DAY, Disp: 180 tablet, Rfl: 0  simvastatin (ZOCOR) 40 MG tablet, TAKE 1 TABLET BY MOUTH EVERYDAY AT BEDTIME, Disp: 30 tablet, Rfl: 0   temazepam (RESTORIL) 15 MG capsule, Take 1-2 caps po QHS, Disp: 30 capsule, Rfl: 0  Observations/Objective: Patient is well-developed, well-nourished in no acute distress.  Resting comfortably at home.  Head is normocephalic, atraumatic.  No labored breathing.  Speech is clear and coherent with logical content.  Patient is alert and oriented at baseline.    Assessment and Plan: There are no diagnoses linked to this encounter. - Continue OTC symptomatic management of choice - Will send OTC vitamins and supplement information through [facility] - Molnupiravir prescribed - Zofran and tessalon perles prescribed for symptomatic management - Patient enrolled in MyChart symptom monitoring - Push fluids - Rest as needed - Discussed return precautions and when to seek in-person evaluation, sent via [facility] as well  Follow Up Instructions: I discussed the assessment and treatment plan with the patient. The patient was provided an  opportunity to ask questions and all were answered. The patient agreed with the plan and demonstrated an understanding of the instructions.  A copy of instructions were sent to the patient via MyChart.  The patient was advised to call back or seek an in-person evaluation if the symptoms worsen or if the condition fails to improve as anticipated.  Time:  I spent 15 minutes with the patient via telehealth technology discussing the above problems/concerns.    Margaretann Loveless, PA-C

## 2021-06-10 ENCOUNTER — Other Ambulatory Visit: Payer: Self-pay | Admitting: Family Medicine

## 2021-06-10 ENCOUNTER — Ambulatory Visit: Payer: Medicare HMO | Admitting: Psychiatry

## 2021-06-15 ENCOUNTER — Encounter: Payer: Self-pay | Admitting: Family Medicine

## 2021-06-20 ENCOUNTER — Ambulatory Visit (INDEPENDENT_AMBULATORY_CARE_PROVIDER_SITE_OTHER): Payer: Medicare HMO | Admitting: *Deleted

## 2021-06-20 DIAGNOSIS — J309 Allergic rhinitis, unspecified: Secondary | ICD-10-CM

## 2021-06-23 ENCOUNTER — Encounter: Payer: Self-pay | Admitting: Family Medicine

## 2021-06-23 DIAGNOSIS — M19049 Primary osteoarthritis, unspecified hand: Secondary | ICD-10-CM

## 2021-06-23 NOTE — Progress Notes (Signed)
VIAL MADE. EXP 06-23-22 

## 2021-06-25 NOTE — Addendum Note (Signed)
Addended by: Abbe Amsterdam C on: 06/25/2021 08:21 PM   Modules accepted: Orders

## 2021-06-26 ENCOUNTER — Ambulatory Visit (INDEPENDENT_AMBULATORY_CARE_PROVIDER_SITE_OTHER): Payer: Medicare HMO

## 2021-06-26 DIAGNOSIS — J309 Allergic rhinitis, unspecified: Secondary | ICD-10-CM | POA: Diagnosis not present

## 2021-06-27 DIAGNOSIS — J3081 Allergic rhinitis due to animal (cat) (dog) hair and dander: Secondary | ICD-10-CM | POA: Diagnosis not present

## 2021-07-02 ENCOUNTER — Ambulatory Visit (INDEPENDENT_AMBULATORY_CARE_PROVIDER_SITE_OTHER): Payer: Medicare HMO

## 2021-07-02 DIAGNOSIS — J309 Allergic rhinitis, unspecified: Secondary | ICD-10-CM | POA: Diagnosis not present

## 2021-07-14 ENCOUNTER — Ambulatory Visit (INDEPENDENT_AMBULATORY_CARE_PROVIDER_SITE_OTHER): Payer: Medicare HMO

## 2021-07-14 DIAGNOSIS — J309 Allergic rhinitis, unspecified: Secondary | ICD-10-CM

## 2021-07-15 ENCOUNTER — Other Ambulatory Visit: Payer: Self-pay | Admitting: Psychiatry

## 2021-07-15 DIAGNOSIS — F411 Generalized anxiety disorder: Secondary | ICD-10-CM

## 2021-07-15 DIAGNOSIS — F32A Depression, unspecified: Secondary | ICD-10-CM

## 2021-07-16 ENCOUNTER — Encounter: Payer: Self-pay | Admitting: Psychiatry

## 2021-07-16 ENCOUNTER — Other Ambulatory Visit: Payer: Self-pay

## 2021-07-16 ENCOUNTER — Ambulatory Visit: Payer: Medicare HMO | Admitting: Psychiatry

## 2021-07-16 DIAGNOSIS — F32A Depression, unspecified: Secondary | ICD-10-CM

## 2021-07-16 DIAGNOSIS — G4700 Insomnia, unspecified: Secondary | ICD-10-CM

## 2021-07-16 DIAGNOSIS — F411 Generalized anxiety disorder: Secondary | ICD-10-CM

## 2021-07-16 MED ORDER — TEMAZEPAM 15 MG PO CAPS: ORAL_CAPSULE | ORAL | 0 refills | Status: DC

## 2021-07-16 MED ORDER — CLONAZEPAM 0.5 MG PO TABS: ORAL_TABLET | ORAL | 2 refills | Status: DC

## 2021-07-16 MED ORDER — LAMOTRIGINE 150 MG PO TABS: 300.0000 mg | ORAL_TABLET | Freq: Every day | ORAL | 0 refills | Status: DC

## 2021-07-16 MED ORDER — BUSPIRONE HCL 15 MG PO TABS: ORAL_TABLET | ORAL | 2 refills | Status: DC

## 2021-07-16 NOTE — Progress Notes (Signed)
Ashley Craig [id number] [birth year] 70 y.o.  Subjective:   Patient ID:  Ashley Craig is a 70 y.o. (DOB 1951-10-29) female.  Chief Complaint:  Chief Complaint  Patient presents with   Anxiety     HPI Ashley Craig presents to the office today for follow-up of anxiety, mood disturbance, and insomnia. She reports that she is waking up with severe anxiety- "nervous, jump up."  She describes feeling of dread in the morning. She reports that nervous feeling will come and go throughout the day. She notices multiple anxious thoughts and worries. She reports feeling "overwhelmed" without apparent trigger. She is going to the beach this weekend and this morning starting feeling anxious about it. She has had difficulty with concentration and be unable to focus due to anxious thoughts. She reports that she has been losing and misplacing things. Denies panic attacks. She reports that multiple pains are contributing to anxiety and stress. She reports intermittent pain.   She reports that she has not had significant depression. She reports that she has been sleeping well. Motivation is low. Energy is ok. Denies anhedonia. Denies SI.   Reports that brother is progressively getting worse with his cancer.   Niece is expecting a baby later this week.   Enjoying Bible study groups.   Has been taking less Klonopin prn. Takes Temazepam prn.    Past Psychiatric Medication Trials: Klonopin- Effective for anxiety. She reports that she has gone weeks without taking it and takes more of it during times of increased stress Lamictal- Has helped stabilize mood. Reports that she has been on higher and lower doses. Has taken at least one year.  Effexor XR- Severe discontinuation s/s.  Prozac Zoloft- Has taken long-term Temazepam- Has used prn when traveling  PHQ2-9    Flowsheet Row Clinical Support from 11/10/2019 in [facility] at [facility]  PHQ-2 Total Score 0         Review of Systems:  Review of Systems  Musculoskeletal:  Positive for arthralgias, back pain and neck pain. Negative for gait problem.       Pain in her left hand  Neurological:  Negative for tremors.  Psychiatric/Behavioral:         Please refer to HPI   Reports mild case of COVID  Medications: I have reviewed the patient's current medications.  Current Outpatient Medications  Medication Sig Dispense Refill   albuterol (PROVENTIL) (2.5 MG/3ML) 0.083% nebulizer solution TAKE 3 MLS BY NEBULIZATION EVERY 6 (SIX) HOURS AS NEEDED FOR WHEEZING OR SHORTNESS OF BREATH. 75 mL 1   azelastine (ASTELIN) 0.1 % nasal spray TWO SPRAYS IN EACH NOSTRIL TWICE A DAY IF NEEDED 30 mL 5   busPIRone (BUSPAR) 15 MG tablet Take 1/3 tablet p.o. twice daily for 1 week, then take 2/3 tablet p.o. twice daily for 1 week, then take 1 tablet p.o. twice daily 60 tablet 2   diclofenac sodium (VOLTAREN) 1 % GEL APPLY TO AFFECTED AREA 1-3 TIMES A DAY FOR 30 DAYS  1   fluticasone (FLONASE) 50 MCG/ACT nasal spray 2 sprays per nostril  once daily if needed  for stuffy nose. 48 g 3   loratadine (CLARITIN) 10 MG tablet TAKE 1 TABLET BY MOUTH EVERY DAY 90 tablet 1   montelukast (SINGULAIR) 10 MG tablet Take 1 tablet (10 mg total) by mouth at bedtime. 90 tablet 3   omeprazole (PRILOSEC) 20 MG capsule TAKE 1 CAPSULE BY MOUTH EVERY DAY (Patient taking differently: Take  20 mg by mouth daily as needed.) 90 capsule 1   sertraline (ZOLOFT) 100 MG tablet TAKE 2 TABLETS BY MOUTH EVERY DAY 180 tablet 0   simvastatin (ZOCOR) 40 MG tablet TAKE 1 TABLET BY MOUTH EVERYDAY AT BEDTIME 30 tablet 3   albuterol (VENTOLIN HFA) 108 (90 Base) MCG/ACT inhaler INHALE 2 PUFFS INTO THE LUNGS EVERY 4 (FOUR) HOURS AS NEEDED FOR WHEEZING OR SHORTNESS OF BREATH. 18 g 0   benzonatate (TESSALON) 100 MG capsule Take 1 capsule (100 mg total) by mouth 3 (three) times daily as needed. (Patient not taking: Reported on 07/16/2021) 30 capsule 0   clobetasol  (TEMOVATE) 0.05 % external solution Apply 1 application topically 2 (two) times daily as needed.     clonazePAM (KLONOPIN) 0.5 MG tablet TAKE 1 TABLET BY MOUTH EVERY DAY AS NEEDED FOR ANXIETY 30 tablet 2   EPINEPHrine (EPIPEN 2-PAK) 0.3 mg/0.3 mL IJ SOAJ injection Inject 0.3 mg into the muscle as needed for anaphylaxis. 2 each 1   ibuprofen (ADVIL,MOTRIN) 200 MG tablet Take 400 mg by mouth 3 (three) times daily as needed. Reported on 11/05/2015     lamoTRIgine (LAMICTAL) 150 MG tablet Take 2 tablets (300 mg total) by mouth daily. 180 tablet 0   Melatonin 3 MG TABS Take 3 mg by mouth at bedtime. Reported on 11/05/2015 (Patient not taking: Reported on 07/16/2021)     NON FORMULARY      ondansetron (ZOFRAN) 4 MG tablet Take 1 tablet (4 mg total) by mouth every 8 (eight) hours as needed for nausea or vomiting. (Patient not taking: Reported on 07/16/2021) 20 tablet 0   perindopril (ACEON) 2 MG tablet Take 1 tablet (2 mg total) by mouth daily. 90 tablet 3   temazepam (RESTORIL) 15 MG capsule Take 1-2 caps po QHS prn 30 capsule 0   No current facility-administered medications for this visit.    Medication Side Effects: None  Allergies:  Allergies  Allergen Reactions   Other Anaphylaxis    Shellfish-Anaphylaxis   Codeine     nausea   Dog Epithelium     Past Medical History:  Diagnosis Date   Anxiety    takes Zoloft daily;takes Clonazepam daily as needed   Arthritis    Chronic back pain    stenosis   Diarrhea    occasionally   GERD (gastroesophageal reflux disease)    takes Omeprazole daily    History of blood clots 10 yrs ago   left calf after a bone break   History of bronchitis 2014   History of colon polyps    benign   Hyperlipidemia    takes Simvastatin daily   Hypertension    Insomnia    takes Melatonin nightly   Joint pain    Seasonal allergies    takes Claritin daily;Nasonex and ProAir as needed;Singulair daily   Weakness    numbness and tingling in right leg    Past  Medical History, Surgical history, Social history, and Family history were reviewed and updated as appropriate.   Please see review of systems for further details on the patient's review from today.   Objective:   Physical Exam:  There were no vitals taken for this visit.  Physical Exam Constitutional:      General: She is not in acute distress. Musculoskeletal:        General: No deformity.  Neurological:     Mental Status: She is alert and oriented to person, place, and time.  Coordination: Coordination normal.  Psychiatric:        Attention and Perception: Attention and perception normal. She does not perceive auditory or visual hallucinations.        Mood and Affect: Mood is anxious. Mood is not depressed. Affect is not labile, blunt, angry or inappropriate.        Speech: Speech normal.        Behavior: Behavior normal.        Thought Content: Thought content normal. Thought content is not paranoid or delusional. Thought content does not include homicidal or suicidal ideation. Thought content does not include homicidal or suicidal plan.        Cognition and Memory: Cognition and memory normal.        Judgment: Judgment normal.     Comments: Insight intact    Lab Review:     Component Value Date/Time   NA 137 02/15/2020 1624   K 4.2 02/15/2020 1624   CL 104 02/15/2020 1624   CO2 26 02/15/2020 1624   GLUCOSE 94 02/15/2020 1624   BUN 14 02/15/2020 1624   CREATININE 0.78 02/15/2020 1624   CALCIUM 8.4 02/15/2020 1624   PROT 6.5 02/15/2020 1624   ALBUMIN 4.3 02/15/2020 1624   AST 17 02/15/2020 1624   ALT 18 02/15/2020 1624   ALKPHOS 121 (H) 02/15/2020 1624   BILITOT 0.3 02/15/2020 1624   GFRNONAA >90 10/03/2014 1027   GFRAA >90 10/03/2014 1027       Component Value Date/Time   WBC 7.5 02/15/2020 1624   RBC 4.52 02/15/2020 1624   HGB 13.3 02/15/2020 1624   HCT 39.5 02/15/2020 1624   PLT 272.0 02/15/2020 1624   MCV 87.4 02/15/2020 1624   MCH 29.2 10/03/2014  1027   MCHC 33.8 02/15/2020 1624   RDW 13.2 02/15/2020 1624    No results found for: POCLITH, LITHIUM   No results found for: PHENYTOIN, PHENOBARB, VALPROATE, CBMZ   .res Assessment: Plan:    Pt seen for 30 minutes and time spent discussing possible treatment options for anxiety. Discussed potential benefits, risks, and side effects of BuSpar.  Patient agrees to trial of BuSpar.  Will start BuSpar 15 mg 1/3 tablet twice daily for 1 week, then increase to 2/3 tablet twice daily for 1 week, then increase to 1 tablet twice daily for anxiety. Will continue Sertraline 200 mg po qd for anxiety and depression.  Will continue Lamictal 300 mg po qd for mood s/s.  Will continue Klonopin prn anxiety.  Continue Temazepam prn insomnia.  Pt to follow-up in approximately 8 weeks or sooner if clinically indicated.  Patient advised to contact office with any questions, adverse effects, or acute worsening in signs and symptoms.   Ashley Craig was seen today for anxiety.  Diagnoses and all orders for this visit:  Generalized anxiety disorder -     busPIRone (BUSPAR) 15 MG tablet; Take 1/3 tablet p.o. twice daily for 1 week, then take 2/3 tablet p.o. twice daily for 1 week, then take 1 tablet p.o. twice daily -     clonazePAM (KLONOPIN) 0.5 MG tablet; TAKE 1 TABLET BY MOUTH EVERY DAY AS NEEDED FOR ANXIETY  Depression, unspecified depression type -     lamoTRIgine (LAMICTAL) 150 MG tablet; Take 2 tablets (300 mg total) by mouth daily.  Insomnia, unspecified type -     temazepam (RESTORIL) 15 MG capsule; Take 1-2 caps po QHS prn    Please see After Visit Summary for patient specific instructions.  Future Appointments  Date Time Provider Department Center  09/22/2021 12:45 PM Corie Chiquito, PMHNP [facility] None    No orders of the defined types were placed in this encounter.   -------------------------------

## 2021-07-24 ENCOUNTER — Ambulatory Visit (INDEPENDENT_AMBULATORY_CARE_PROVIDER_SITE_OTHER): Payer: Medicare HMO

## 2021-07-24 DIAGNOSIS — J309 Allergic rhinitis, unspecified: Secondary | ICD-10-CM

## 2021-07-25 ENCOUNTER — Other Ambulatory Visit: Payer: Self-pay | Admitting: Family Medicine

## 2021-07-25 DIAGNOSIS — Z1231 Encounter for screening mammogram for malignant neoplasm of breast: Secondary | ICD-10-CM

## 2021-07-29 ENCOUNTER — Ambulatory Visit (INDEPENDENT_AMBULATORY_CARE_PROVIDER_SITE_OTHER): Payer: Medicare HMO

## 2021-07-29 DIAGNOSIS — J309 Allergic rhinitis, unspecified: Secondary | ICD-10-CM | POA: Diagnosis not present

## 2021-08-04 ENCOUNTER — Ambulatory Visit (INDEPENDENT_AMBULATORY_CARE_PROVIDER_SITE_OTHER): Payer: Medicare HMO

## 2021-08-04 DIAGNOSIS — J309 Allergic rhinitis, unspecified: Secondary | ICD-10-CM | POA: Diagnosis not present

## 2021-08-06 ENCOUNTER — Ambulatory Visit: Payer: Medicare HMO

## 2021-08-08 ENCOUNTER — Other Ambulatory Visit: Payer: Self-pay | Admitting: Psychiatry

## 2021-08-08 DIAGNOSIS — F411 Generalized anxiety disorder: Secondary | ICD-10-CM

## 2021-08-12 ENCOUNTER — Ambulatory Visit (INDEPENDENT_AMBULATORY_CARE_PROVIDER_SITE_OTHER): Payer: Medicare HMO

## 2021-08-12 DIAGNOSIS — J309 Allergic rhinitis, unspecified: Secondary | ICD-10-CM | POA: Diagnosis not present

## 2021-08-19 ENCOUNTER — Ambulatory Visit (INDEPENDENT_AMBULATORY_CARE_PROVIDER_SITE_OTHER): Payer: Medicare HMO

## 2021-08-19 DIAGNOSIS — J309 Allergic rhinitis, unspecified: Secondary | ICD-10-CM

## 2021-08-27 ENCOUNTER — Other Ambulatory Visit: Payer: Self-pay | Admitting: Rehabilitation

## 2021-08-27 DIAGNOSIS — M5412 Radiculopathy, cervical region: Secondary | ICD-10-CM

## 2021-08-29 ENCOUNTER — Ambulatory Visit (INDEPENDENT_AMBULATORY_CARE_PROVIDER_SITE_OTHER): Payer: Medicare HMO

## 2021-08-29 DIAGNOSIS — J309 Allergic rhinitis, unspecified: Secondary | ICD-10-CM | POA: Diagnosis not present

## 2021-09-02 ENCOUNTER — Ambulatory Visit (INDEPENDENT_AMBULATORY_CARE_PROVIDER_SITE_OTHER): Payer: Medicare HMO

## 2021-09-02 DIAGNOSIS — J309 Allergic rhinitis, unspecified: Secondary | ICD-10-CM | POA: Diagnosis not present

## 2021-09-10 ENCOUNTER — Ambulatory Visit
Admission: RE | Admit: 2021-09-10 | Discharge: 2021-09-10 | Disposition: A | Discharge status: Home / Self Care | Payer: Medicare HMO | Source: Ambulatory Visit | Attending: Family Medicine | Admitting: Family Medicine

## 2021-09-10 ENCOUNTER — Other Ambulatory Visit: Payer: Self-pay

## 2021-09-10 DIAGNOSIS — Z1231 Encounter for screening mammogram for malignant neoplasm of breast: Secondary | ICD-10-CM

## 2021-09-11 ENCOUNTER — Ambulatory Visit (INDEPENDENT_AMBULATORY_CARE_PROVIDER_SITE_OTHER): Payer: Medicare HMO

## 2021-09-11 DIAGNOSIS — J309 Allergic rhinitis, unspecified: Secondary | ICD-10-CM

## 2021-09-12 ENCOUNTER — Other Ambulatory Visit: Payer: Self-pay | Admitting: Family Medicine

## 2021-09-12 DIAGNOSIS — R928 Other abnormal and inconclusive findings on diagnostic imaging of breast: Secondary | ICD-10-CM

## 2021-09-13 ENCOUNTER — Other Ambulatory Visit: Payer: Self-pay

## 2021-09-13 ENCOUNTER — Ambulatory Visit
Admission: RE | Admit: 2021-09-13 | Discharge: 2021-09-13 | Disposition: A | Discharge status: Home / Self Care | Payer: Medicare HMO | Source: Ambulatory Visit | Attending: Rehabilitation | Admitting: Rehabilitation

## 2021-09-13 DIAGNOSIS — M5412 Radiculopathy, cervical region: Secondary | ICD-10-CM

## 2021-09-17 ENCOUNTER — Ambulatory Visit (INDEPENDENT_AMBULATORY_CARE_PROVIDER_SITE_OTHER): Payer: Medicare HMO

## 2021-09-17 DIAGNOSIS — J309 Allergic rhinitis, unspecified: Secondary | ICD-10-CM | POA: Diagnosis not present

## 2021-09-21 ENCOUNTER — Other Ambulatory Visit: Payer: Self-pay | Admitting: Family Medicine

## 2021-09-22 ENCOUNTER — Ambulatory Visit: Payer: Medicare HMO | Admitting: Psychiatry

## 2021-09-22 ENCOUNTER — Other Ambulatory Visit: Payer: Self-pay

## 2021-09-22 ENCOUNTER — Encounter: Payer: Self-pay | Admitting: Psychiatry

## 2021-09-22 DIAGNOSIS — F411 Generalized anxiety disorder: Secondary | ICD-10-CM

## 2021-09-22 DIAGNOSIS — F32A Depression, unspecified: Secondary | ICD-10-CM | POA: Diagnosis not present

## 2021-09-22 DIAGNOSIS — G4700 Insomnia, unspecified: Secondary | ICD-10-CM

## 2021-09-22 MED ORDER — TEMAZEPAM 15 MG PO CAPS: ORAL_CAPSULE | ORAL | 0 refills | Status: DC

## 2021-09-22 MED ORDER — CLONAZEPAM 0.5 MG PO TABS: ORAL_TABLET | ORAL | 2 refills | Status: DC

## 2021-09-22 NOTE — Progress Notes (Signed)
Ashley Craig [id number] 1951/09/03 70 y.o.  Subjective:   Patient ID:  Ashley Craig is a 70 y.o. (DOB 22-Sep-1951) female.  Chief Complaint:  Chief Complaint  Patient presents with   Other    Grief   Follow-up    Mood disturbance, anxiety.     HPI Ashley Craig presents to the office today for follow-up of anxiety and mood disturbance. Her brother passed away on August 27, 2021 with metastatic cancer. She was with her brother when he took his last breath. Brother was 7.5 years older. Erasmo Score was scheduled for 10 am. Reports that they missed the funeral due to great-niece needing to go to the ER. Reports that funeral home buried brother in the wrong cemetery plot. She reports that her family found humor in the situation. She reports "the emotions just flood" at times, such as the night before the funeral. Has been grieving the loss of her brother and reports that this loss has affected her more than any other loss. Denies depression- "it's just grief." Sleeping ok other than when she has pain. Appetite has been ok. She notices some procrastination. Will put off paper work and writing a check. She reports adequate motivation towards hygiene and chores. She reports "I'm very apathetic." Reports concentration "comes and goes."   She reports that Buspar has been beneficial for her anxiety. She is using Klonopin prn more and this is effective. She reports that she has not needed Klonopin prn in the last few days. Denies panic attacks.   Reports one incident of fleeting SI.  She reports that she has had multiple health issues recently.   Sister-in-law plans to get a dog.   Past Psychiatric Medication Trials: Klonopin- Effective for anxiety. She reports that she has gone weeks without taking it and takes more of it during times of increased stress Lamictal- Has helped stabilize mood. Reports that she has been on higher and lower doses. Has taken at least one year.  Effexor XR- Severe  discontinuation s/s.  Prozac Zoloft- Has taken long-term Temazepam- Has used prn when traveling Gabapentin- Difficulty with balance. Took for pain. Buspar    PHQ2-9    Flowsheet Row Clinical Support from 11/10/2019 in [facility] at [facility]  PHQ-2 Total Score 0        Review of Systems:  Review of Systems  Musculoskeletal:  Positive for arthralgias, back pain, neck pain and neck stiffness. Negative for gait problem.       Has injection scheduled for January. Reports injuring herself when picking something up recently without realizing it was heavy. Knee pain  Neurological:  Negative for tremors.  Psychiatric/Behavioral:         Please refer to HPI   Medications: I have reviewed the patient's current medications.  Current Outpatient Medications  Medication Sig Dispense Refill   fluticasone (FLONASE) 50 MCG/ACT nasal spray 2 sprays per nostril  once daily if needed  for stuffy nose. 48 g 3   gabapentin (NEURONTIN) 300 MG capsule Take by mouth.     ibuprofen (ADVIL,MOTRIN) 200 MG tablet Take 400 mg by mouth 3 (three) times daily as needed. Reported on 11/05/2015     lamoTRIgine (LAMICTAL) 150 MG tablet Take 2 tablets (300 mg total) by mouth daily. 180 tablet 0   loratadine (CLARITIN) 10 MG tablet TAKE 1 TABLET BY MOUTH EVERY DAY 90 tablet 1   Melatonin 3 MG TABS Take 3 mg by mouth at bedtime. Reported on 11/05/2015  montelukast (SINGULAIR) 10 MG tablet Take 1 tablet (10 mg total) by mouth at bedtime. 90 tablet 3   perindopril (ACEON) 2 MG tablet Take 1 tablet (2 mg total) by mouth daily. 90 tablet 3   sertraline (ZOLOFT) 100 MG tablet TAKE 2 TABLETS BY MOUTH EVERY DAY 180 tablet 0   albuterol (PROVENTIL) (2.5 MG/3ML) 0.083% nebulizer solution TAKE 3 MLS BY NEBULIZATION EVERY 6 (SIX) HOURS AS NEEDED FOR WHEEZING OR SHORTNESS OF BREATH. 75 mL 1   albuterol (VENTOLIN HFA) 108 (90 Base) MCG/ACT inhaler INHALE 2 PUFFS INTO THE LUNGS EVERY 4 (FOUR) HOURS AS  NEEDED FOR WHEEZING OR SHORTNESS OF BREATH. 18 g 0   azelastine (ASTELIN) 0.1 % nasal spray TWO SPRAYS IN EACH NOSTRIL TWICE A DAY IF NEEDED 30 mL 5   busPIRone (BUSPAR) 15 MG tablet Take 2 tablets (30 mg total) by mouth daily. 180 tablet 0   [START ON 10/20/2021] clonazePAM (KLONOPIN) 0.5 MG tablet TAKE 1 TABLET BY MOUTH EVERY DAY AS NEEDED FOR ANXIETY 30 tablet 2   diclofenac sodium (VOLTAREN) 1 % GEL APPLY TO AFFECTED AREA 1-3 TIMES A DAY FOR 30 DAYS  1   EPINEPHrine (EPIPEN 2-PAK) 0.3 mg/0.3 mL IJ SOAJ injection Inject 0.3 mg into the muscle as needed for anaphylaxis. 2 each 1   simvastatin (ZOCOR) 40 MG tablet TAKE 1 TABLET BY MOUTH EVERYDAY AT BEDTIME 90 tablet 1   temazepam (RESTORIL) 15 MG capsule Take 1-2 caps po QHS prn 30 capsule 0   No current facility-administered medications for this visit.    Medication Side Effects: None  Allergies:  Allergies  Allergen Reactions   Other Anaphylaxis    Shellfish-Anaphylaxis   Codeine     nausea   Dog Epithelium     Past Medical History:  Diagnosis Date   Anxiety    takes Zoloft daily;takes Clonazepam daily as needed   Arthritis    Chronic back pain    stenosis   Diarrhea    occasionally   GERD (gastroesophageal reflux disease)    takes Omeprazole daily    History of blood clots 10 yrs ago   left calf after a bone break   History of bronchitis 2014   History of colon polyps    benign   Hyperlipidemia    takes Simvastatin daily   Hypertension    Insomnia    takes Melatonin nightly   Joint pain    Seasonal allergies    takes Claritin daily;Nasonex and ProAir as needed;Singulair daily   Weakness    numbness and tingling in right leg    Past Medical History, Surgical history, Social history, and Family history were reviewed and updated as appropriate.   Please see review of systems for further details on the patient's review from today.   Objective:   Physical Exam:  There were no vitals taken for this  visit.  Physical Exam Constitutional:      General: She is not in acute distress. Musculoskeletal:        General: No deformity.  Neurological:     Mental Status: She is alert and oriented to person, place, and time.     Coordination: Coordination normal.  Psychiatric:        Attention and Perception: Attention and perception normal. She does not perceive auditory or visual hallucinations.        Mood and Affect: Mood normal. Mood is not anxious or depressed. Affect is not labile, blunt, angry or inappropriate.  Speech: Speech normal.        Behavior: Behavior normal.        Thought Content: Thought content normal. Thought content is not paranoid or delusional. Thought content does not include homicidal or suicidal ideation. Thought content does not include homicidal or suicidal plan.        Cognition and Memory: Cognition and memory normal.        Judgment: Judgment normal.     Comments: Insight intact    Lab Review:     Component Value Date/Time   NA 137 02/15/2020 1624   K 4.2 02/15/2020 1624   CL 104 02/15/2020 1624   CO2 26 02/15/2020 1624   GLUCOSE 94 02/15/2020 1624   BUN 14 02/15/2020 1624   CREATININE 0.78 02/15/2020 1624   CALCIUM 8.4 02/15/2020 1624   PROT 6.5 02/15/2020 1624   ALBUMIN 4.3 02/15/2020 1624   AST 17 02/15/2020 1624   ALT 18 02/15/2020 1624   ALKPHOS 121 (H) 02/15/2020 1624   BILITOT 0.3 02/15/2020 1624   GFRNONAA >90 10/03/2014 1027   GFRAA >90 10/03/2014 1027       Component Value Date/Time   WBC 7.5 02/15/2020 1624   RBC 4.52 02/15/2020 1624   HGB 13.3 02/15/2020 1624   HCT 39.5 02/15/2020 1624   PLT 272.0 02/15/2020 1624   MCV 87.4 02/15/2020 1624   MCH 29.2 10/03/2014 1027   MCHC 33.8 02/15/2020 1624   RDW 13.2 02/15/2020 1624    No results found for: POCLITH, LITHIUM   No results found for: PHENYTOIN, PHENOBARB, VALPROATE, CBMZ   .res Assessment: Plan:   Pt seen for 30 minutes and time spent counseling pt regarding  recent stressors and losses. She reports that recent s/s are entirely grief related and does not seem to have depression. She declines grief counseling. She reports that Buspar has been  helpful for her anxiety and she would like to continue Buspar 15 mg po BID for anxiety.  Continue Klonopin 0.5 mg po qd prn anxiety.  Continue Lamictal 300 mg po qd for mood s/s.  Continue Sertraline 200 mg po qd for anxiety and depression.  Continue Temazepam 15 mg 1-2 caps po QHS prn insomnia.  Pt to follow-up in 4 months or sooner if clinically indicated.  Patient advised to contact office with any questions, adverse effects, or acute worsening in signs and symptoms.   Ashley Craig was seen today for other and follow-up.  Diagnoses and all orders for this visit:  Depression, unspecified depression type  Insomnia, unspecified type -     temazepam (RESTORIL) 15 MG capsule; Take 1-2 caps po QHS prn  Generalized anxiety disorder -     clonazePAM (KLONOPIN) 0.5 MG tablet; TAKE 1 TABLET BY MOUTH EVERY DAY AS NEEDED FOR ANXIETY    Please see After Visit Summary for patient specific instructions.  Future Appointments  Date Time Provider Department Center  10/09/2021 12:50 PM [facility] [place] [facility]  10/09/2021  1:00 PM [facility] [place] [facility]  01/19/2022 12:45 PM Corie Chiquito, PMHNP [facility] None    No orders of the defined types were placed in this encounter.   -------------------------------

## 2021-09-26 ENCOUNTER — Encounter: Payer: Self-pay | Admitting: Family Medicine

## 2021-09-28 ENCOUNTER — Emergency Department (HOSPITAL_BASED_OUTPATIENT_CLINIC_OR_DEPARTMENT_OTHER): Payer: Medicare HMO

## 2021-09-28 ENCOUNTER — Other Ambulatory Visit: Payer: Self-pay

## 2021-09-28 ENCOUNTER — Emergency Department (HOSPITAL_BASED_OUTPATIENT_CLINIC_OR_DEPARTMENT_OTHER)
Admission: EM | Admit: 2021-09-28 | Discharge: 2021-09-28 | Disposition: A | Discharge status: Home / Self Care | Payer: Medicare HMO | Attending: Emergency Medicine | Admitting: Emergency Medicine

## 2021-09-28 ENCOUNTER — Encounter (HOSPITAL_BASED_OUTPATIENT_CLINIC_OR_DEPARTMENT_OTHER): Payer: Self-pay | Admitting: *Deleted

## 2021-09-28 DIAGNOSIS — J45909 Unspecified asthma, uncomplicated: Secondary | ICD-10-CM | POA: Diagnosis not present

## 2021-09-28 DIAGNOSIS — S93402A Sprain of unspecified ligament of left ankle, initial encounter: Secondary | ICD-10-CM | POA: Diagnosis not present

## 2021-09-28 DIAGNOSIS — W010XXA Fall on same level from slipping, tripping and stumbling without subsequent striking against object, initial encounter: Secondary | ICD-10-CM | POA: Insufficient documentation

## 2021-09-28 DIAGNOSIS — Y9301 Activity, walking, marching and hiking: Secondary | ICD-10-CM | POA: Diagnosis not present

## 2021-09-28 DIAGNOSIS — I10 Essential (primary) hypertension: Secondary | ICD-10-CM | POA: Diagnosis not present

## 2021-09-28 DIAGNOSIS — S99912A Unspecified injury of left ankle, initial encounter: Secondary | ICD-10-CM | POA: Diagnosis present

## 2021-09-28 MED ORDER — OXYCODONE-ACETAMINOPHEN 5-325 MG PO TABS: 1.0000 | ORAL_TABLET | Freq: Four times a day (QID) | ORAL | 0 refills | Status: DC | PRN

## 2021-09-28 MED ORDER — DICLOFENAC SODIUM 1 % EX GEL: 2.0000 g | Freq: Four times a day (QID) | CUTANEOUS | 0 refills | Status: DC | PRN

## 2021-09-28 MED ORDER — OXYCODONE-ACETAMINOPHEN 5-325 MG PO TABS
1.0000 | ORAL_TABLET | ORAL | Status: AC | PRN
  Administered 2021-09-28 (×2): 1 via ORAL
  Filled 2021-09-28 (×2): qty 1

## 2021-09-28 MED ORDER — ONDANSETRON 4 MG PO TBDP
4.0000 mg | ORAL_TABLET | Freq: Once | ORAL | Status: AC
  Administered 2021-09-28: 16:00:00 4 mg via ORAL
  Filled 2021-09-28: qty 1

## 2021-09-28 NOTE — Discharge Instructions (Addendum)
As we discussed, you do not have any broken or dislocated bones in your foot or ankle, but you do have an ankle sprain. There is always a chance that a small fracture did not appear on today's x-ray. Please read through the included information about routine injury care (RICE = rest, ice, compression, elevation), and take over-the-counter pain medicine according to label instructions.  If you do not have any reason to avoid ibuprofen, you can also consider taking ibuprofen 600 mg 3 times a day with meals, but do this for no more than 5 days as it may cause to some stomach discomfort over time.  Use crutches if provided and you may bear weight as tolerated.  Follow-up is recommended with the orthopedic surgeon or with your regular doctor.

## 2021-09-28 NOTE — ED Notes (Signed)
Attempted to p/u for imaging but needs meds in order to tolerate xrays.  RN to call when ready.

## 2021-09-28 NOTE — ED Notes (Signed)
Pt in xray

## 2021-09-28 NOTE — ED Triage Notes (Addendum)
States she fell on a wet floor sustained injury to Left Ankle, arrived via EMS with Splint in place, CMS WNL. Pain increases with extension/ flexion of left foot. Ice and elevation implemented upon arrival to ED Room 7

## 2021-09-28 NOTE — ED Notes (Signed)
ED Provider at bedside. 

## 2021-09-28 NOTE — ED Provider Notes (Signed)
Emergency Department Provider Note   I have reviewed the triage vital signs and the nursing notes.   HISTORY  Chief Complaint Ankle Pain (Arrived via EMS injury to Left ankle)   HPI TANEASHA FUQUA is a 70 y.o. female presents to the ED by EMS after a fall. She was walking on her porch when she slipped and fell. She has chronic issues with the left knee but is mainly having left ankle and calf pain after the fall. She denies head trauma. No LOC. No syncope. No SOB. No numbness/weakness in the leg. Pain is moderate to severe and worse with movement.    Past Medical History:  Diagnosis Date   Anxiety    takes Zoloft daily;takes Clonazepam daily as needed   Arthritis    Chronic back pain    stenosis   Diarrhea    occasionally   GERD (gastroesophageal reflux disease)    takes Omeprazole daily    History of blood clots 10 yrs ago   left calf after a bone break   History of bronchitis 2014   History of colon polyps    benign   Hyperlipidemia    takes Simvastatin daily   Hypertension    Insomnia    takes Melatonin nightly   Joint pain    Seasonal allergies    takes Claritin daily;Nasonex and ProAir as needed;Singulair daily   Weakness    numbness and tingling in right leg    Patient Active Problem List   Diagnosis Date Noted   Allergic rhinitis due to animal hair and dander 09/25/2019   Anaphylactic shock due to adverse food reaction 05/11/2018   Seasonal allergic conjunctivitis 05/09/2018   Impingement syndrome of left ankle 07/12/2017   Diarrhea 05/21/2017   Gastroesophageal reflux disease with esophagitis 05/21/2017   History of colon polyps 05/21/2017   Orthostatic hypotension 04/08/2017   Chronic pain of left ankle 03/30/2017   Unilateral primary osteoarthritis, left knee 11/13/2016   Allergic dermatitis 08/12/2016   Chronic dryness of both eyes 06/02/2016   Hypermetropia of both eyes 06/02/2016   Open angle with borderline findings and low glaucoma risk  in both eyes 06/02/2016   Pinguecula of both eyes 06/02/2016   Presbyopia of both eyes 06/02/2016   Regular astigmatism of both eyes 06/02/2016   Cellulitis of right lower extremity 01/30/2016   Varicose veins of left lower extremity with pain 01/16/2016   Mild persistent asthma without complication 11/05/2015   Chronic venous insufficiency 08/07/2015   Spontaneous hematoma of lower leg 11/30/2014   Spinal stenosis of lumbar region 10/11/2014   Allergic rhinitis 04/27/2014   Adjustment disorder 04/27/2014   Benign essential hypertension 04/27/2014   Benign paroxysmal vertigo 04/27/2014   Chondrocostal junction syndrome 04/27/2014   Elevation of level of transaminase or lactic acid dehydrogenase (LDH) 04/27/2014   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   Overweight 04/27/2014   Spontaneous ecchymosis 04/27/2014   Tendonitis of shoulder 04/27/2014   Gastroesophageal reflux disease 03/27/2014   Anxiety 03/27/2014   Environmental allergies 03/27/2014   Hypercholesteremia 03/27/2014   Insomnia 03/27/2014   Vitamin D deficiency 03/27/2014    Past Surgical History:  Procedure Laterality Date   ANAL FISSURE REPAIR     ANKLE SURGERY Left 09/2017   colonosocpy     D&C of bladder  65yrs ago   ESOPHAGOGASTRODUODENOSCOPY     with ED   TONSILLECTOMY     WRIST SURGERY Right    with plate    Allergies Other,  Codeine, and Dog epithelium  Family History  Problem Relation Age of Onset   Cancer Mother    Retinitis pigmentosa Father    Depression Father    Anxiety disorder Sister    Melanoma Brother    Anxiety disorder Brother    Anorexia nervosa Brother    Leukemia Niece    Anorexia nervosa Cousin    Allergic rhinitis Neg Hx    Angioedema Neg Hx    Asthma Neg Hx    Eczema Neg Hx    Immunodeficiency Neg Hx    Urticaria Neg Hx    Breast cancer Neg Hx     Social History Social History   Tobacco Use   Smoking status: Never   Smokeless tobacco: Never  Vaping Use   Vaping  Use: Never used  Substance Use Topics   Alcohol use: Yes    Comment: 1-2 glasses of wine daily   Drug use: No    Review of Systems  Constitutional: No fever/chills Eyes: No visual changes. ENT: No sore throat. Cardiovascular: Denies chest pain. Respiratory: Denies shortness of breath. Gastrointestinal: No abdominal pain.  No nausea, no vomiting.  No diarrhea.  No constipation. Genitourinary: Negative for dysuria. Musculoskeletal: Negative for back pain. Positive left ankle and lower leg pain.  Skin: Negative for rash. Neurological: Negative for headaches, focal weakness or numbness.  10-point ROS otherwise negative.  ____________________________________________   PHYSICAL EXAM:  VITAL SIGNS: ED Triage Vitals  Enc Vitals Group     BP 09/28/21 1553 (!) 153/77     Pulse Rate 09/28/21 1553 65     Resp 09/28/21 1553 18     Temp 09/28/21 1553 98 F (36.7 C)     Temp Source 09/28/21 1553 Oral     SpO2 09/28/21 1550 99 %     Weight 09/28/21 1554 190 lb (86.2 kg)     Height 09/28/21 1554 5\' 7"  (1.702 m)   Constitutional: Alert and oriented. Well appearing and in no acute distress. Eyes: Conjunctivae are normal.  Head: Atraumatic. Nose: No congestion/rhinnorhea. Mouth/Throat: Mucous membranes are moist. Neck: No stridor.   Cardiovascular: Normal rate, regular rhythm. Good peripheral circulation. Grossly normal heart sounds.   Respiratory: Normal respiratory effort.  No retractions. Lungs CTAB. Gastrointestinal: Soft and nontender. No distention.  Musculoskeletal: No tenderness or bruising over the left knee. Normal ROM of the knee and hip. Lateral ankle swelling with some bruising. No laceration. Limited ROM of the left ankle.  Neurologic:  Normal speech and language. No gross focal neurologic deficits are appreciated.  Skin:  Skin is warm, dry and intact. No rash noted.  ____________________________________________  RADIOLOGY  DG Tibia/Fibula Left  Result Date:  09/28/2021 CLINICAL DATA:  09/30/2021 at home.  Left leg pain. EXAM: LEFT TIBIA AND FIBULA - 2 VIEW COMPARISON:  None. FINDINGS: Imaging extended from the distal tibial diaphysis to the knee. No fracture. No bone lesion. Knee joint is normally aligned. Soft tissues are unremarkable. IMPRESSION: No fracture or dislocation. Electronically Signed   By: Larey Seat M.D.   On: 09/28/2021 17:10   DG Ankle Complete Left  Result Date: 09/28/2021 CLINICAL DATA:  Status post fall EXAM: LEFT ANKLE COMPLETE - 3+ VIEW COMPARISON:  None. FINDINGS: There is no evidence of fracture, dislocation, or joint effusion. There is no evidence of arthropathy or other focal bone abnormality. Soft tissues are unremarkable. IMPRESSION: No acute displaced fracture or dislocation. Electronically Signed   By: 09/30/2021 M.D.   On: 09/28/2021 17:04  CT Ankle Left Wo Contrast  Result Date: 09/28/2021 CLINICAL DATA:  Ankle fracture. EXAM: CT OF THE LEFT ANKLE WITHOUT CONTRAST TECHNIQUE: Multidetector CT imaging of the left ankle was performed according to the standard protocol. Multiplanar CT image reconstructions were also generated. COMPARISON:  Left ankle x-ray 09/28/2021. FINDINGS: Bones/Joint/Cartilage There is a tiny acute nondisplaced avulsion fracture through the anterior tip of the lateral malleolus. No other fractures are visualized. There is no dislocation. Joint spaces are maintained. Ligaments Suboptimally assessed by CT. Muscles and Tendons Within normal limits. Soft tissues Subcutaneous edema overlying the calcaneus. IMPRESSION: 1. Tiny nondisplaced avulsion fracture tip of the lateral malleolus. 2. Subcutaneous edema overlying the calcaneus. Electronically Signed   By: Darliss Cheney M.D.   On: 09/28/2021 19:28    ____________________________________________   PROCEDURES  Procedure(s) performed:   Procedures  None ____________________________________________   INITIAL IMPRESSION / ASSESSMENT AND PLAN / ED  COURSE  Pertinent labs & imaging results that were available during my care of the patient were reviewed by me and considered in my medical decision making (see chart for details).   Patient presents to the ED with ankle pain after a mechanical fall. Neurovascularly intact here. No evidence of septic joint or open fracture.   Differential includes fracture vs dislocation vs sprain.    Plain films reviewed without fracture or dislocation but ROM is significantly reduced and pain moderate to severe with difficulty placing with air cast. CT imaging reviewed. Avulsion fracture noted. Patient splinted and provided crutches. Ortho contact information provided. [place] drug database reviewed.    ____________________________________________  FINAL CLINICAL IMPRESSION(S) / ED DIAGNOSES  Final diagnoses:  Sprain of left ankle, unspecified ligament, initial encounter     MEDICATIONS GIVEN DURING THIS VISIT:  Medications  oxyCODONE-acetaminophen (PERCOCET/ROXICET) 5-325 MG per tablet 1 tablet (1 tablet Oral Given 09/28/21 1951)  ondansetron (ZOFRAN-ODT) disintegrating tablet 4 mg (4 mg Oral Given 09/28/21 1609)     NEW OUTPATIENT MEDICATIONS STARTED DURING THIS VISIT:  Discharge Medication List as of 09/28/2021  5:16 PM     START taking these medications   Details  diclofenac Sodium (VOLTAREN) 1 % GEL Apply 2 g topically 4 (four) times daily as needed., Starting Sun 09/28/2021, Normal    oxyCODONE-acetaminophen (PERCOCET/ROXICET) 5-325 MG tablet Take 1 tablet by mouth every 6 (six) hours as needed for severe pain., Starting Sun 09/28/2021, Normal        Note:  This document was prepared using Dragon voice recognition software and may include unintentional dictation errors.  Alona Bene, MD, [facility] Emergency Medicine    Deema Juncaj, Arlyss Repress, MD 09/29/21 1240

## 2021-10-01 ENCOUNTER — Other Ambulatory Visit: Payer: Self-pay

## 2021-10-01 ENCOUNTER — Ambulatory Visit (HOSPITAL_COMMUNITY)
Admission: RE | Admit: 2021-10-01 | Discharge: 2021-10-01 | Disposition: A | Discharge status: Home / Self Care | Payer: Medicare HMO | Source: Ambulatory Visit | Attending: Orthopaedic Surgery | Admitting: Orthopaedic Surgery

## 2021-10-01 ENCOUNTER — Other Ambulatory Visit (HOSPITAL_COMMUNITY): Payer: Self-pay | Admitting: Orthopaedic Surgery

## 2021-10-01 DIAGNOSIS — M7989 Other specified soft tissue disorders: Secondary | ICD-10-CM | POA: Diagnosis not present

## 2021-10-01 DIAGNOSIS — M79662 Pain in left lower leg: Secondary | ICD-10-CM | POA: Diagnosis present

## 2021-10-07 DIAGNOSIS — J3081 Allergic rhinitis due to animal (cat) (dog) hair and dander: Secondary | ICD-10-CM

## 2021-10-07 NOTE — Progress Notes (Signed)
VIAL MADE. EXP 10-07-22

## 2021-10-09 ENCOUNTER — Telehealth: Payer: Self-pay

## 2021-10-09 ENCOUNTER — Other Ambulatory Visit: Payer: Medicare HMO

## 2021-10-09 NOTE — Telephone Encounter (Signed)
Patient calling letting us know she has a fractured her foot that's why she hasn't come in for her injection. Told patient to call office a few days before coming in for her injection(s) so that will let us know if we need to mix her vial(s) down.

## 2021-10-15 ENCOUNTER — Other Ambulatory Visit: Payer: Self-pay | Admitting: Rehabilitation

## 2021-10-15 DIAGNOSIS — M5416 Radiculopathy, lumbar region: Secondary | ICD-10-CM

## 2021-10-15 DIAGNOSIS — M4326 Fusion of spine, lumbar region: Secondary | ICD-10-CM

## 2021-10-29 ENCOUNTER — Other Ambulatory Visit: Payer: Self-pay | Admitting: Family Medicine

## 2021-11-07 ENCOUNTER — Other Ambulatory Visit: Payer: Self-pay | Admitting: Psychiatry

## 2021-11-07 DIAGNOSIS — F411 Generalized anxiety disorder: Secondary | ICD-10-CM

## 2021-11-11 ENCOUNTER — Ambulatory Visit: Payer: Medicare HMO

## 2021-11-11 ENCOUNTER — Ambulatory Visit
Admission: RE | Admit: 2021-11-11 | Discharge: 2021-11-11 | Disposition: A | Discharge status: Home / Self Care | Payer: Medicare HMO | Source: Ambulatory Visit | Attending: Family Medicine | Admitting: Family Medicine

## 2021-11-11 DIAGNOSIS — R928 Other abnormal and inconclusive findings on diagnostic imaging of breast: Secondary | ICD-10-CM

## 2021-11-12 ENCOUNTER — Ambulatory Visit
Admission: RE | Admit: 2021-11-12 | Discharge: 2021-11-12 | Disposition: A | Discharge status: Home / Self Care | Payer: Medicare HMO | Source: Ambulatory Visit | Attending: Rehabilitation | Admitting: Rehabilitation

## 2021-11-12 DIAGNOSIS — M5416 Radiculopathy, lumbar region: Secondary | ICD-10-CM

## 2021-11-12 DIAGNOSIS — M4326 Fusion of spine, lumbar region: Secondary | ICD-10-CM

## 2021-11-12 MED ORDER — GADOBENATE DIMEGLUMINE 529 MG/ML IV SOLN
18.0000 mL | Freq: Once | INTRAVENOUS | Status: AC | PRN
  Administered 2021-11-12: 18 mL via INTRAVENOUS

## 2021-11-17 ENCOUNTER — Other Ambulatory Visit: Payer: Self-pay | Admitting: Psychiatry

## 2021-11-17 DIAGNOSIS — F411 Generalized anxiety disorder: Secondary | ICD-10-CM

## 2021-11-17 DIAGNOSIS — F32A Depression, unspecified: Secondary | ICD-10-CM

## 2021-11-19 ENCOUNTER — Other Ambulatory Visit: Payer: Medicare HMO

## 2021-12-06 ENCOUNTER — Other Ambulatory Visit: Payer: Self-pay | Admitting: Family Medicine

## 2022-01-03 ENCOUNTER — Other Ambulatory Visit: Payer: Self-pay | Admitting: Family Medicine

## 2022-01-03 DIAGNOSIS — I10 Essential (primary) hypertension: Secondary | ICD-10-CM

## 2022-01-05 ENCOUNTER — Encounter: Payer: Self-pay | Admitting: Family Medicine

## 2022-01-05 NOTE — Telephone Encounter (Signed)
Perindopril on back order- pharmacy requesting change to benazepril. See pended.  ?

## 2022-01-19 ENCOUNTER — Ambulatory Visit: Payer: Medicare HMO | Admitting: Psychiatry

## 2022-01-19 ENCOUNTER — Encounter: Payer: Self-pay | Admitting: Psychiatry

## 2022-01-19 ENCOUNTER — Other Ambulatory Visit: Payer: Self-pay

## 2022-01-19 DIAGNOSIS — F32A Depression, unspecified: Secondary | ICD-10-CM

## 2022-01-19 DIAGNOSIS — F411 Generalized anxiety disorder: Secondary | ICD-10-CM

## 2022-01-19 MED ORDER — LAMOTRIGINE 150 MG PO TABS: 300.0000 mg | ORAL_TABLET | Freq: Every day | ORAL | 1 refills | Status: DC

## 2022-01-19 MED ORDER — CLONAZEPAM 0.5 MG PO TABS: ORAL_TABLET | ORAL | 2 refills | Status: DC

## 2022-01-19 MED ORDER — SERTRALINE HCL 100 MG PO TABS: 200.0000 mg | ORAL_TABLET | Freq: Every day | ORAL | 1 refills | Status: DC

## 2022-01-19 MED ORDER — BUSPIRONE HCL 15 MG PO TABS: 30.0000 mg | ORAL_TABLET | Freq: Every day | ORAL | 1 refills | Status: DC

## 2022-01-19 NOTE — Progress Notes (Signed)
Ashley Craig ?[id number] ?12/12/1950 ?71 y.o. ? ?Subjective:  ? ?Patient ID:  Ashley Craig is a 71 y.o. (DOB 04/17/1951) female. ? ?Chief Complaint:  ?Chief Complaint  ?Patient presents with  ? Follow-up  ?  Depression, anxiety, insomnia  ? ? ?HPI ?Ashley Craig presents to the office today for follow-up of mood disturbance, anxiety, and insomnia. gShe had to have another spinal fusion. She reports that she was told that her surgery went well. Her twin sister, Ashley Craig, has been staying with her to help with recovery over the last several weeks. She reports that prior to surgery she was unsteady and had some falls. Also had to have fluid removed from knee. She reports that friends have been supportive. She reports that she is no longer taking pain medication daily. PT is coming twice a week to her home.  ? ?She reports that she has "2 meltdowns." On one occasion she cried because she felt "guilty" that sister was needing to stay with her for recovery. She reports that anxiety has been ok. Denies depressed mood other than these 2 events. Sleeping well. She reports, "I'm worn out." Energy has been very low- "I force myself." Motivation has been good and is having to to make sure she does not overdo it. Appetite has been ok. She reports that she has not eaten as much since surgery and food tastes different. She reports difficulty with concentration. Denies SI.  ? ? ?Past Psychiatric Medication Trials: ?Klonopin- Effective for anxiety. She reports that she has gone weeks without taking it and takes more of it during times of increased stress ?Lamictal- Has helped stabilize mood. Reports that she has been on higher and lower doses. Has taken at least one year.  ?Effexor XR- Severe discontinuation s/s.  ?Prozac ?Zoloft- Has taken long-term ?Temazepam- Has used prn when traveling ?Gabapentin- Difficulty with balance. Took for pain. ?Buspar ? ?PHQ2-9   ? ?Flowsheet Row Clinical Support from 11/10/2019 in [facility]  ?PHQ-2 Total Score 0  ? ?  ? ?Flowsheet Row ED from 09/28/2021 in [facility] EMERGENCY DEPARTMENT  ?C-SSRS RISK CATEGORY No Risk  ? ?  ?  ? ?Review of Systems:  ?Review of Systems  ?Gastrointestinal:  Negative for constipation.  ?Musculoskeletal:  Positive for back pain and gait problem.  ?Neurological:   ?     Has had occ headaches  ?Psychiatric/Behavioral:    ?     Please refer to HPI  ? ?Medications: I have reviewed the patient's current medications. ? ?Current Outpatient Medications  ?Medication Sig Dispense Refill  ? acetaminophen (ACETAMINOPHEN EXTRA STRENGTH) 500 MG tablet Take 500 mg by mouth every 6 (six) hours as needed.    ? albuterol (PROVENTIL) (2.5 MG/3ML) 0.083% nebulizer solution TAKE 3 MLS BY NEBULIZATION EVERY 6 (SIX) HOURS AS NEEDED FOR WHEEZING OR SHORTNESS OF BREATH. 75 mL 1  ? albuterol (VENTOLIN HFA) 108 (90 Base) MCG/ACT inhaler INHALE 2 PUFFS INTO THE LUNGS EVERY 4 HOURS AS NEEDED FOR WHEEZE OR FOR SHORTNESS OF BREATH 18 g 1  ? azelastine (ASTELIN) 0.1 % nasal spray TWO SPRAYS IN EACH NOSTRIL TWICE A DAY IF NEEDED 30 mL 5  ? Calcium Carb-Cholecalciferol (CALCIUM 600+D) 600-10 MG-MCG TABS Take by mouth.    ? diclofenac Sodium (VOLTAREN) 1 % GEL Apply 2 g topically 4 (four) times daily as needed. 100 g 0  ? lisinopril (ZESTRIL) 5 MG tablet Take 1 tablet (5 mg total)  by mouth daily. 30 tablet 3  ? loratadine (CLARITIN) 10 MG tablet TAKE 1 TABLET BY MOUTH EVERY DAY 90 tablet 1  ? montelukast (SINGULAIR) 10 MG tablet Take 1 tablet (10 mg total) by mouth at bedtime. 90 tablet 3  ? oxyCODONE-acetaminophen (PERCOCET/ROXICET) 5-325 MG tablet Take 1 tablet by mouth every 6 (six) hours as needed for severe pain. 12 tablet 0  ? simvastatin (ZOCOR) 40 MG tablet TAKE 1 TABLET BY MOUTH EVERYDAY AT BEDTIME 90 tablet 1  ? busPIRone (BUSPAR) 15 MG tablet Take 2 tablets (30 mg total) by mouth daily. 180 tablet 1  ? [START ON 03/16/2022] clonazePAM (KLONOPIN) 0.5 MG  tablet TAKE 1 TABLET BY MOUTH EVERY DAY AS NEEDED FOR ANXIETY 30 tablet 2  ? EPINEPHrine (EPIPEN 2-PAK) 0.3 mg/0.3 mL IJ SOAJ injection Inject 0.3 mg into the muscle as needed for anaphylaxis. 2 each 1  ? fluticasone (FLONASE) 50 MCG/ACT nasal spray 2 sprays per nostril  once daily if needed  for stuffy nose. (Patient not taking: Reported on 01/19/2022) 48 g 3  ? ibuprofen (ADVIL,MOTRIN) 200 MG tablet Take 400 mg by mouth 3 (three) times daily as needed. Reported on 11/05/2015 (Patient not taking: Reported on 01/19/2022)    ? lamoTRIgine (LAMICTAL) 150 MG tablet Take 2 tablets (300 mg total) by mouth daily. 180 tablet 1  ? Melatonin 3 MG TABS Take 3 mg by mouth at bedtime. Reported on 11/05/2015 (Patient not taking: Reported on 01/19/2022)    ? sertraline (ZOLOFT) 100 MG tablet Take 2 tablets (200 mg total) by mouth daily. 180 tablet 1  ? temazepam (RESTORIL) 15 MG capsule Take 1-2 caps po QHS prn 30 capsule 0  ? ?No current facility-administered medications for this visit.  ? ? ?Medication Side Effects: None ? ?Allergies:  ?Allergies  ?Allergen Reactions  ? Other Anaphylaxis  ?  Shellfish-Anaphylaxis  ? Codeine   ?  nausea  ? Dog Epithelium   ? ? ?Past Medical History:  ?Diagnosis Date  ? Anxiety   ? takes Zoloft daily;takes Clonazepam daily as needed  ? Arthritis   ? Chronic back pain   ? stenosis  ? Diarrhea   ? occasionally  ? GERD (gastroesophageal reflux disease)   ? takes Omeprazole daily   ? History of blood clots 10 yrs ago  ? left calf after a bone break  ? History of bronchitis 2014  ? History of colon polyps   ? benign  ? Hyperlipidemia   ? takes Simvastatin daily  ? Hypertension   ? Insomnia   ? takes Melatonin nightly  ? Joint pain   ? Seasonal allergies   ? takes Claritin daily;Nasonex and ProAir as needed;Singulair daily  ? Weakness   ? numbness and tingling in right leg  ? ? ?Past Medical History, Surgical history, Social history, and Family history were reviewed and updated as appropriate.  ? ?Please see  review of systems for further details on the patient's review from today.  ? ?Objective:  ? ?Physical Exam:  ?There were no vitals taken for this visit. ? ?Physical Exam ?Constitutional:   ?   General: She is not in acute distress. ?Musculoskeletal:     ?   General: No deformity.  ?Neurological:  ?   Mental Status: She is alert and oriented to person, place, and time.  ?   Coordination: Coordination normal.  ?Psychiatric:     ?   Attention and Perception: Attention and perception normal. She does not perceive  auditory or visual hallucinations.     ?   Mood and Affect: Mood normal. Mood is not anxious or depressed. Affect is not labile, blunt, angry or inappropriate.     ?   Speech: Speech normal.     ?   Behavior: Behavior normal.     ?   Thought Content: Thought content normal. Thought content is not paranoid or delusional. Thought content does not include homicidal or suicidal ideation. Thought content does not include homicidal or suicidal plan.     ?   Cognition and Memory: Cognition and memory normal.     ?   Judgment: Judgment normal.  ?   Comments: Insight intact  ? ? ?Lab Review:  ?   ?Component Value Date/Time  ? NA 137 02/15/2020 1624  ? K 4.2 02/15/2020 1624  ? CL 104 02/15/2020 1624  ? CO2 26 02/15/2020 1624  ? GLUCOSE 94 02/15/2020 1624  ? BUN 14 02/15/2020 1624  ? CREATININE 0.78 02/15/2020 1624  ? CALCIUM 8.4 02/15/2020 1624  ? PROT 6.5 02/15/2020 1624  ? ALBUMIN 4.3 02/15/2020 1624  ? AST 17 02/15/2020 1624  ? ALT 18 02/15/2020 1624  ? ALKPHOS 121 (H) 02/15/2020 1624  ? BILITOT 0.3 02/15/2020 1624  ? GFRNONAA >90 10/03/2014 1027  ? GFRAA >90 10/03/2014 1027  ? ? ?   ?Component Value Date/Time  ? WBC 7.5 02/15/2020 1624  ? RBC 4.52 02/15/2020 1624  ? HGB 13.3 02/15/2020 1624  ? HCT 39.5 02/15/2020 1624  ? PLT 272.0 02/15/2020 1624  ? MCV 87.4 02/15/2020 1624  ? MCH 29.2 10/03/2014 1027  ? MCHC 33.8 02/15/2020 1624  ? RDW 13.2 02/15/2020 1624  ? ? ?No results found for: POCLITH, LITHIUM  ? ?No results  found for: PHENYTOIN, PHENOBARB, VALPROATE, CBMZ  ? ?.res ?Assessment: Plan:   ?Pt seen for 30 minutes and time spent discussing recent changes in medical/surgical history and recent stressors. Discus

## 2022-01-30 ENCOUNTER — Other Ambulatory Visit: Payer: Self-pay | Admitting: Family Medicine

## 2022-01-30 DIAGNOSIS — I10 Essential (primary) hypertension: Secondary | ICD-10-CM

## 2022-02-09 ENCOUNTER — Other Ambulatory Visit: Payer: Self-pay | Admitting: Family Medicine

## 2022-03-17 NOTE — Progress Notes (Signed)
[place] at [facility] [address] [place], [place] [address] [phone]  Date:  03/19/2022   Name:  Ashley Craig   DOB:  July 19, 1951   MRN:  [id number]  PCP:  Darreld Mclean, MD    Chief Complaint: Annual Exam (Concerns/ questions: /1. Dexa done. 2. Wants to be sure to have labs. 3. OTC vitamins. 4. Colonoscopy scheduled for 01/2023 5. Place on scalp)   History of Present Illness:  Ashley Craig is a 71 y.o. very pleasant female patient who presents with the following:  Patient seen today for physical exam Most recent visit with myself was in February of last year, virtual visit for sinusitis  History of allergies, GERD, vertigo, HTN, Fuch's corneal dystrophy, likely benign elevation of alk phos  She is seen by Thayer Headings at [facility] for anxiety Most recent with her ophthalmologist, Dr. Antionette Fairy last month- stable  She also had lumbar spine surgery in February per Dr. Sherlyn Lick- Discharge Diagnosis: s/p revision L3-S1 laminectomy and PSF with L3-4 TLIF and removal and replacement of hardware.  She is following up with them today She also has a spinal cord stimulator She broke a bone in her left foot in a fall in December, and she has end stage knee arthritis. The plan is to have a knee replacement per Dr Mardelle Matte coming up She is also seeing Dr Lucia Gaskins for her foot; she has some numbness and nerve dis-function in her foot  Recommend COVID-19 booster Mammogram up-to-date-screening abnormal in November, she had follow-up diagnostic in January which was normal Colon cancer screening up-to-date Can update bone density- order for her today  Can update blood work today Shingrix, pneumonia is complete  Her brother died in the fall at age 2- he had melanoma which came back  Patient Active Problem List   Diagnosis Date Noted   Allergic rhinitis due to animal hair and dander 09/25/2019   Anaphylactic shock due to  adverse food reaction 05/11/2018   Seasonal allergic conjunctivitis 05/09/2018   Impingement syndrome of left ankle 07/12/2017   Diarrhea 05/21/2017   Gastroesophageal reflux disease with esophagitis 05/21/2017   History of colon polyps 05/21/2017   Orthostatic hypotension 04/08/2017   Chronic pain of left ankle 03/30/2017   Unilateral primary osteoarthritis, left knee 11/13/2016   Allergic dermatitis 08/12/2016   Chronic dryness of both eyes 06/02/2016   Hypermetropia of both eyes 06/02/2016   Open angle with borderline findings and low glaucoma risk in both eyes 06/02/2016   Pinguecula of both eyes 06/02/2016   Presbyopia of both eyes 06/02/2016   Regular astigmatism of both eyes 06/02/2016   Cellulitis of right lower extremity 01/30/2016   Varicose veins of left lower extremity with pain 01/16/2016   Mild persistent asthma without complication 23/53/6144   Chronic venous insufficiency 08/07/2015   Spontaneous hematoma of lower leg 11/30/2014   Spinal stenosis of lumbar region 10/11/2014   Allergic rhinitis 04/27/2014   Adjustment disorder 04/27/2014   Benign essential hypertension 04/27/2014   Benign paroxysmal vertigo 04/27/2014   Chondrocostal junction syndrome 04/27/2014   Elevation of level of transaminase or lactic acid dehydrogenase (LDH) 04/27/2014   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   Overweight 04/27/2014   Spontaneous ecchymosis 04/27/2014   Tendonitis of shoulder 04/27/2014   Gastroesophageal reflux disease 03/27/2014   Anxiety 03/27/2014   Environmental allergies 03/27/2014   Hypercholesteremia 03/27/2014   Insomnia 03/27/2014   Vitamin D deficiency 03/27/2014  Past Medical History:  Diagnosis Date   Anxiety    takes Zoloft daily;takes Clonazepam daily as needed   Arthritis    Chronic back pain    stenosis   Diarrhea    occasionally   GERD (gastroesophageal reflux disease)    takes Omeprazole daily    History of blood clots 10 yrs ago   left calf  after a bone break   History of bronchitis 2014   History of colon polyps    benign   Hyperlipidemia    takes Simvastatin daily   Hypertension    Insomnia    takes Melatonin nightly   Joint pain    Seasonal allergies    takes Claritin daily;Nasonex and ProAir as needed;Singulair daily   Weakness    numbness and tingling in right leg    Past Surgical History:  Procedure Laterality Date   ANAL FISSURE REPAIR     ANKLE SURGERY Left 09/2017   colonosocpy     D&C of bladder  34yr ago   ESOPHAGOGASTRODUODENOSCOPY     with ED   TONSILLECTOMY     WRIST SURGERY Right    with plate    Social History   Tobacco Use   Smoking status: Never   Smokeless tobacco: Never  Vaping Use   Vaping Use: Never used  Substance Use Topics   Alcohol use: Yes    Comment: 1-2 glasses of wine daily   Drug use: No    Family History  Problem Relation Age of Onset   Cancer Mother    Retinitis pigmentosa Father    Depression Father    Anxiety disorder Sister    Melanoma Brother    Anxiety disorder Brother    Anorexia nervosa Brother    Leukemia Niece    Anorexia nervosa Cousin    Allergic rhinitis Neg Hx    Angioedema Neg Hx    Asthma Neg Hx    Eczema Neg Hx    Immunodeficiency Neg Hx    Urticaria Neg Hx    Breast cancer Neg Hx     Allergies  Allergen Reactions   Other Anaphylaxis    Shellfish-Anaphylaxis   Codeine     nausea   Dog Epithelium     Medication list has been reviewed and updated.  Current Outpatient Medications on File Prior to Visit  Medication Sig Dispense Refill   acetaminophen (TYLENOL) 500 MG tablet Take 500 mg by mouth every 6 (six) hours as needed.     albuterol (PROVENTIL) (2.5 MG/3ML) 0.083% nebulizer solution TAKE 3 MLS BY NEBULIZATION EVERY 6 (SIX) HOURS AS NEEDED FOR WHEEZING OR SHORTNESS OF BREATH. 75 mL 1   albuterol (VENTOLIN HFA) 108 (90 Base) MCG/ACT inhaler INHALE 2 PUFFS INTO THE LUNGS EVERY 4 HOURS AS NEEDED FOR WHEEZE OR FOR SHORTNESS OF  BREATH 18 g 1   azelastine (ASTELIN) 0.1 % nasal spray TWO SPRAYS IN EACH NOSTRIL TWICE A DAY IF NEEDED 30 mL 5   busPIRone (BUSPAR) 15 MG tablet Take 2 tablets (30 mg total) by mouth daily. 180 tablet 1   Calcium Carb-Cholecalciferol 600-10 MG-MCG TABS Take by mouth.     clonazePAM (KLONOPIN) 0.5 MG tablet TAKE 1 TABLET BY MOUTH EVERY DAY AS NEEDED FOR ANXIETY 30 tablet 2   EPINEPHrine (EPIPEN 2-PAK) 0.3 mg/0.3 mL IJ SOAJ injection Inject 0.3 mg into the muscle as needed for anaphylaxis. 2 each 1   lamoTRIgine (LAMICTAL) 150 MG tablet Take 2 tablets (300 mg total) by  mouth daily. 180 tablet 1   lisinopril (ZESTRIL) 5 MG tablet TAKE 1 TABLET (5 MG TOTAL) BY MOUTH DAILY. 90 tablet 1   loratadine (CLARITIN) 10 MG tablet TAKE 1 TABLET BY MOUTH EVERY DAY 90 tablet 1   Melatonin 3 MG TABS Take 3 mg by mouth at bedtime. Reported on 11/05/2015     montelukast (SINGULAIR) 10 MG tablet Take 1 tablet (10 mg total) by mouth at bedtime. 90 tablet 3   omeprazole (PRILOSEC) 20 MG capsule TAKE 1 CAPSULE BY MOUTH EVERY DAY 90 capsule 1   sertraline (ZOLOFT) 100 MG tablet Take 2 tablets (200 mg total) by mouth daily. 180 tablet 1   simvastatin (ZOCOR) 40 MG tablet TAKE 1 TABLET BY MOUTH EVERYDAY AT BEDTIME 90 tablet 1   temazepam (RESTORIL) 15 MG capsule Take 1-2 caps po QHS prn 30 capsule 0   No current facility-administered medications on file prior to visit.    Review of Systems:  As per HPI- otherwise negative.   Physical Examination: Vitals:   03/19/22 1007  BP: 110/76  Pulse: 67  Resp: 18  Temp: 98.2 F (36.8 C)  SpO2: 97%   Vitals:   03/19/22 1007  Weight: 191 lb (86.6 kg)  Height: '5\' 7"'  (1.702 m)   Body mass index is 29.91 kg/m. Ideal Body Weight: Weight in (lb) to have BMI = 25: 159.3  GEN: no acute distress.  Overweight, looks well.  She is still having some difficulty getting around due to her joint issues HEENT: Atraumatic, Normocephalic.  Bilateral TM wnl, oropharynx normal.   PEERL,EOMI.   Ears and Nose: No external deformity. CV: RRR, No M/G/R. No JVD. No thrill. No extra heart sounds. PULM: CTA B, no wheezes, crackles, rhonchi. No retractions. No resp. distress. No accessory muscle use. ABD: S, NT, ND No rebound. No HSM. EXTR: No c/c/e PSYCH: Normally interactive. Conversant.    Assessment and Plan: Physical exam  Screening for deficiency anemia - Plan: CBC  Hypercholesteremia - Plan: Lipid panel  Vitamin D deficiency  Benign essential hypertension - Plan: CBC, Comprehensive metabolic panel  Screening for diabetes mellitus - Plan: Comprehensive metabolic panel, Hemoglobin A1c  Screening for thyroid disorder - Plan: TSH  Fatigue, unspecified type - Plan: TSH  Estrogen deficiency - Plan: DG Bone Density   Physical exam today.  Encouraged healthy diet and exercise routine Ordered bone density Blood pressure looks good on minimal lisinopril Will plan further follow- up pending labs.  Signed Lamar Blinks, MD Received labs as below, message to patient  Results for orders placed or performed in visit on 03/19/22  CBC  Result Value Ref Range   WBC 6.4 4.0 - 10.5 K/uL   RBC 4.89 3.87 - 5.11 Mil/uL   Platelets 308.0 150.0 - 400.0 K/uL   Hemoglobin 13.5 12.0 - 15.0 g/dL   HCT 41.4 36.0 - 46.0 %   MCV 84.7 78.0 - 100.0 fl   MCHC 32.7 30.0 - 36.0 g/dL   RDW 14.5 11.5 - 15.5 %  Comprehensive metabolic panel  Result Value Ref Range   Sodium 135 135 - 145 mEq/L   Potassium 4.3 3.5 - 5.1 mEq/L   Chloride 102 96 - 112 mEq/L   CO2 25 19 - 32 mEq/L   Glucose, Bld 87 70 - 99 mg/dL   BUN 16 6 - 23 mg/dL   Creatinine, Ser 0.76 0.40 - 1.20 mg/dL   Total Bilirubin 0.3 0.2 - 1.2 mg/dL   Alkaline Phosphatase 134 (H)  39 - 117 U/L   AST 14 0 - 37 U/L   ALT 14 0 - 35 U/L   Total Protein 6.6 6.0 - 8.3 g/dL   Albumin 4.4 3.5 - 5.2 g/dL   GFR 80.40 >60.00 mL/min   Calcium 9.0 8.4 - 10.5 mg/dL  Hemoglobin A1c  Result Value Ref Range   Hgb A1c MFr  Bld 5.5 4.6 - 6.5 %  Lipid panel  Result Value Ref Range   Cholesterol 238 (H) 0 - 200 mg/dL   Triglycerides 172.0 (H) 0.0 - 149.0 mg/dL   HDL 94.70 >39.00 mg/dL   VLDL 34.4 0.0 - 40.0 mg/dL   LDL Cholesterol 108 (H) 0 - 99 mg/dL   Total CHOL/HDL Ratio 3    NonHDL 142.82   TSH  Result Value Ref Range   TSH 1.00 0.35 - 5.50 uIU/mL

## 2022-03-17 NOTE — Patient Instructions (Addendum)
It was great to see you again today, I will be in touch with your labs as soon as possible. Consider getting a COVID-19 booster if not done in the last 6 months We ordered your bone density today- complete at your convenience

## 2022-03-19 ENCOUNTER — Ambulatory Visit (INDEPENDENT_AMBULATORY_CARE_PROVIDER_SITE_OTHER): Payer: Medicare HMO | Admitting: Family Medicine

## 2022-03-19 ENCOUNTER — Encounter: Payer: Self-pay | Admitting: Family Medicine

## 2022-03-19 VITALS — BP 110/76 | HR 67 | Temp 98.2°F | Resp 18 | Ht 67.0 in | Wt 191.0 lb

## 2022-03-19 DIAGNOSIS — R5383 Other fatigue: Secondary | ICD-10-CM | POA: Diagnosis not present

## 2022-03-19 DIAGNOSIS — Z131 Encounter for screening for diabetes mellitus: Secondary | ICD-10-CM

## 2022-03-19 DIAGNOSIS — I10 Essential (primary) hypertension: Secondary | ICD-10-CM | POA: Diagnosis not present

## 2022-03-19 DIAGNOSIS — E559 Vitamin D deficiency, unspecified: Secondary | ICD-10-CM | POA: Diagnosis not present

## 2022-03-19 DIAGNOSIS — Z130 Encounter for screening for diseases of the blood and blood-forming organs and certain disorders involving the immune mechanism: Secondary | ICD-10-CM

## 2022-03-19 DIAGNOSIS — Z1329 Encounter for screening for other suspected endocrine disorder: Secondary | ICD-10-CM

## 2022-03-19 DIAGNOSIS — E2839 Other primary ovarian failure: Secondary | ICD-10-CM

## 2022-03-19 DIAGNOSIS — E7800 Pure hypercholesterolemia, unspecified: Secondary | ICD-10-CM

## 2022-03-19 DIAGNOSIS — Z0000 Encounter for general adult medical examination without abnormal findings: Secondary | ICD-10-CM

## 2022-03-19 LAB — LIPID PANEL
Cholesterol: 238 mg/dL — ABNORMAL HIGH (ref 0–200)
HDL: 94.7 mg/dL (ref >39.00)
LDL Cholesterol: 108 mg/dL — ABNORMAL HIGH (ref 0–99)
NonHDL: 142.82
Total CHOL/HDL Ratio: 3
Triglycerides: 172 mg/dL — ABNORMAL HIGH (ref 0.0–149.0)
VLDL: 34.4 mg/dL (ref 0.0–40.0)

## 2022-03-19 LAB — COMPREHENSIVE METABOLIC PANEL
ALT: 14 U/L (ref 0–35)
AST: 14 U/L (ref 0–37)
Albumin: 4.4 g/dL (ref 3.5–5.2)
Alkaline Phosphatase: 134 U/L — ABNORMAL HIGH (ref 39–117)
BUN: 16 mg/dL (ref 6–23)
CO2: 25 mEq/L (ref 19–32)
Calcium: 9 mg/dL (ref 8.4–10.5)
Chloride: 102 mEq/L (ref 96–112)
Creatinine, Ser: 0.76 mg/dL (ref 0.40–1.20)
GFR: 80.4 mL/min (ref >60.00)
Glucose, Bld: 87 mg/dL (ref 70–99)
Potassium: 4.3 mEq/L (ref 3.5–5.1)
Sodium: 135 mEq/L (ref 135–145)
Total Bilirubin: 0.3 mg/dL (ref 0.2–1.2)
Total Protein: 6.6 g/dL (ref 6.0–8.3)

## 2022-03-19 LAB — CBC
HCT: 41.4 % (ref 36.0–46.0)
Hemoglobin: 13.5 g/dL (ref 12.0–15.0)
MCHC: 32.7 g/dL (ref 30.0–36.0)
MCV: 84.7 fl (ref 78.0–100.0)
Platelets: 308 10*3/uL (ref 150.0–400.0)
RBC: 4.89 Mil/uL (ref 3.87–5.11)
RDW: 14.5 % (ref 11.5–15.5)
WBC: 6.4 10*3/uL (ref 4.0–10.5)

## 2022-03-19 LAB — HEMOGLOBIN A1C: Hgb A1c MFr Bld: 5.5 % (ref 4.6–6.5)

## 2022-03-19 LAB — TSH: TSH: 1 u[IU]/mL (ref 0.35–5.50)

## 2022-03-20 ENCOUNTER — Encounter: Payer: Self-pay | Admitting: Family Medicine

## 2022-03-20 DIAGNOSIS — R748 Abnormal levels of other serum enzymes: Secondary | ICD-10-CM

## 2022-03-23 ENCOUNTER — Other Ambulatory Visit (HOSPITAL_BASED_OUTPATIENT_CLINIC_OR_DEPARTMENT_OTHER): Payer: Medicare HMO

## 2022-03-23 ENCOUNTER — Encounter (HOSPITAL_BASED_OUTPATIENT_CLINIC_OR_DEPARTMENT_OTHER): Payer: Self-pay

## 2022-03-25 ENCOUNTER — Other Ambulatory Visit: Payer: Self-pay | Admitting: Family Medicine

## 2022-03-31 ENCOUNTER — Ambulatory Visit (HOSPITAL_BASED_OUTPATIENT_CLINIC_OR_DEPARTMENT_OTHER)
Admission: RE | Admit: 2022-03-31 | Discharge: 2022-03-31 | Disposition: A | Discharge status: Home / Self Care | Payer: Medicare HMO | Source: Ambulatory Visit | Attending: Family Medicine | Admitting: Family Medicine

## 2022-03-31 DIAGNOSIS — E2839 Other primary ovarian failure: Secondary | ICD-10-CM | POA: Insufficient documentation

## 2022-04-01 ENCOUNTER — Encounter: Payer: Self-pay | Admitting: Family Medicine

## 2022-04-01 DIAGNOSIS — M8580 Other specified disorders of bone density and structure, unspecified site: Secondary | ICD-10-CM

## 2022-04-02 ENCOUNTER — Telehealth: Payer: Medicare HMO | Admitting: Physician Assistant

## 2022-04-02 ENCOUNTER — Encounter: Payer: Self-pay | Admitting: Family Medicine

## 2022-04-02 DIAGNOSIS — B9689 Other specified bacterial agents as the cause of diseases classified elsewhere: Secondary | ICD-10-CM | POA: Diagnosis not present

## 2022-04-02 DIAGNOSIS — J208 Acute bronchitis due to other specified organisms: Secondary | ICD-10-CM

## 2022-04-02 MED ORDER — ALENDRONATE SODIUM 70 MG PO TABS: 70.0000 mg | ORAL_TABLET | ORAL | 11 refills | Status: DC

## 2022-04-02 NOTE — Progress Notes (Signed)
We are sorry that you are not feeling well.  Here is how we plan to help!  Based on your presentation I believe you most likely have A cough due to bacteria.  When patients have a fever or/and a productive cough with a change in color or increased sputum production, we are concerned about bacterial bronchitis.  If left untreated it can progress to pneumonia.  If your symptoms do not improve with your treatment plan it is important that you contact your provider.   I have prescribed Doxycycline 100 mg twice a day for 7 days     In addition you may use A prescription cough medication called Tessalon Perles 100mg . You may take 1-2 capsules every 8 hours as needed for your cough. I have also sent in a 5-day burst of prednisone to take as directed.   From your responses in the eVisit questionnaire you describe inflammation in the upper respiratory tract which is causing a significant cough.  This is commonly called Bronchitis and has four common causes:   Allergies Viral Infections Acid Reflux Bacterial Infection Allergies, viruses and acid reflux are treated by controlling symptoms or eliminating the cause. An example might be a cough caused by taking certain blood pressure medications. You stop the cough by changing the medication. Another example might be a cough caused by acid reflux. Controlling the reflux helps control the cough.  USE OF BRONCHODILATOR ("RESCUE") INHALERS: There is a risk from using your bronchodilator too frequently.  The risk is that over-reliance on a medication which only relaxes the muscles surrounding the breathing tubes can reduce the effectiveness of medications prescribed to reduce swelling and congestion of the tubes themselves.  Although you feel brief relief from the bronchodilator inhaler, your asthma may actually be worsening with the tubes becoming more swollen and filled with mucus.  This can delay other crucial treatments, such as oral steroid medications. If you  need to use a bronchodilator inhaler daily, several times per day, you should discuss this with your provider.  There are probably better treatments that could be used to keep your asthma under control.     HOME CARE Only take medications as instructed by your medical team. Complete the entire course of an antibiotic. Drink plenty of fluids and get plenty of rest. Avoid close contacts especially the very young and the elderly Cover your mouth if you cough or cough into your sleeve. Always remember to wash your hands A steam or ultrasonic humidifier can help congestion.   GET HELP RIGHT AWAY IF: You develop worsening fever. You become short of breath You cough up blood. Your symptoms persist after you have completed your treatment plan MAKE SURE YOU  Understand these instructions. Will watch your condition. Will get help right away if you are not doing well or get worse.    Thank you for choosing an e-visit.  Your e-visit answers were reviewed by a board certified advanced clinical practitioner to complete your personal care plan. Depending upon the condition, your plan could have included both over the counter or prescription medications.  Please review your pharmacy choice. Make sure the pharmacy is open so you can pick up prescription now. If there is a problem, you may contact your provider through and have the prescription routed to another pharmacy.  Your safety is important to [employer]. If you have drug allergies check your prescription carefully.   For the next 24 hours you can use MyChart to ask questions about  today's visit, request a non-urgent call back, or ask for a work or school excuse. You will get an email in the next two days asking about your experience. I hope that your e-visit has been valuable and will speed your recovery.

## 2022-04-02 NOTE — Progress Notes (Signed)
I have spent 5 minutes in review of e-visit questionnaire, review and updating patient chart, medical decision making and response to patient.   Shadman Tozzi Cody Marquite Attwood, PA-C    

## 2022-04-03 ENCOUNTER — Other Ambulatory Visit: Payer: Self-pay | Admitting: Physician Assistant

## 2022-04-03 MED ORDER — BENZONATATE 100 MG PO CAPS: 100.0000 mg | ORAL_CAPSULE | Freq: Three times a day (TID) | ORAL | 0 refills | Status: DC | PRN

## 2022-04-03 MED ORDER — DOXYCYCLINE HYCLATE 100 MG PO TABS: 100.0000 mg | ORAL_TABLET | Freq: Two times a day (BID) | ORAL | 0 refills | Status: DC

## 2022-04-03 MED ORDER — PREDNISONE 20 MG PO TABS: 40.0000 mg | ORAL_TABLET | Freq: Every day | ORAL | 0 refills | Status: AC

## 2022-04-03 NOTE — Progress Notes (Signed)
Patient states her prednisone and tessalon pearls have not been sent to the pharmacy yet per her E-Visit yesterday. She would like them to be sent to:  [id number] [place], [place] - [address] [place] [address]  Phone:  [phone]  Fax:  [phone]

## 2022-04-16 ENCOUNTER — Other Ambulatory Visit: Payer: Self-pay | Admitting: Family

## 2022-05-14 ENCOUNTER — Other Ambulatory Visit: Payer: Self-pay | Admitting: Family

## 2022-05-18 ENCOUNTER — Ambulatory Visit: Payer: Medicare HMO | Admitting: Psychiatry

## 2022-05-18 ENCOUNTER — Encounter: Payer: Self-pay | Admitting: Psychiatry

## 2022-05-18 DIAGNOSIS — F411 Generalized anxiety disorder: Secondary | ICD-10-CM | POA: Diagnosis not present

## 2022-05-18 DIAGNOSIS — F32A Depression, unspecified: Secondary | ICD-10-CM

## 2022-05-18 DIAGNOSIS — G4700 Insomnia, unspecified: Secondary | ICD-10-CM | POA: Diagnosis not present

## 2022-05-18 MED ORDER — BUSPIRONE HCL 15 MG PO TABS: 30.0000 mg | ORAL_TABLET | Freq: Every day | ORAL | 1 refills | Status: DC

## 2022-05-18 MED ORDER — CLONAZEPAM 0.5 MG PO TABS: ORAL_TABLET | ORAL | 2 refills | Status: DC

## 2022-05-18 NOTE — Progress Notes (Signed)
Ashley Craig [id number] 1951/07/30 71 y.o.  Subjective:   Patient ID:  Ashley Craig is a 71 y.o. (DOB [birth year]) female.  Chief Complaint:  Chief Complaint  Patient presents with   Follow-up    Anxiety, mood disturbance, insomnia    HPI Ashley Craig presents to the office today for follow-up of mood disturbance, anxiety, and insomnia. She reports that she is "aggravated" with back pain. She reports that her pain is gradually improving and that her follow-up visits are going well. She reports that she will need a knee replacement and also foot surgery. Sister would be able to stay and take care of her. She has some upcoming trips planned and is unsure if she will be able to stay in the car for that long. She denies depressed mood. She reports getting "aggravated" that she cannot do certain things that she used to do, such as pulling weeds or lifting objects. She is fearful about falling, particularly since her knee and foot have caused her to fall. She denies excessive anxiety. Sleeping well. Energy and motivation have been good. She would like to walk when it is not as hot. Appetite has been good. Concentration has been good. Denies SI.   Continues to grieve loss of brother in September. Today is brother's birthday.   Has been taking Klonopin prn on occasion when she feels like she is about to come out of her skin. Klonopin last filled 05/06/22. Has not needed Temazepam recently and it was last filled 09/22/21.  Past Psychiatric Medication Trials: Klonopin- Effective for anxiety. She reports that she has gone weeks without taking it and takes more of it during times of increased stress Lamictal- Has helped stabilize mood. Reports that she has been on higher and lower doses. Has taken at least one year.  Effexor XR- Severe discontinuation s/s.  Prozac Zoloft- Has taken long-term Temazepam- Has used prn when traveling Gabapentin- Difficulty with balance. Took for pain. Buspar     PHQ2-9    Flowsheet Row Clinical Support from 11/10/2019 in [facility]  PHQ-2 Total Score 0      Flowsheet Row ED from 09/28/2021 in [facility]  C-SSRS RISK CATEGORY No Risk        Review of Systems:  Review of Systems  Musculoskeletal:  Positive for back pain. Negative for gait problem.  Skin:  Negative for rash.  Neurological:        Tremor in left hand  Psychiatric/Behavioral:         Please refer to HPI    Medications: I have reviewed the patient's current medications.  Current Outpatient Medications  Medication Sig Dispense Refill   acetaminophen (TYLENOL) 500 MG tablet Take 500 mg by mouth every 6 (six) hours as needed.     albuterol (PROVENTIL) (2.5 MG/3ML) 0.083% nebulizer solution TAKE 3 MLS BY NEBULIZATION EVERY 6 (SIX) HOURS AS NEEDED FOR WHEEZING OR SHORTNESS OF BREATH. 75 mL 1   albuterol (VENTOLIN HFA) 108 (90 Base) MCG/ACT inhaler INHALE 2 PUFFS INTO THE LUNGS EVERY 4 HOURS AS NEEDED FOR WHEEZE OR FOR SHORTNESS OF BREATH 18 g 1   alendronate (FOSAMAX) 70 MG tablet Take 1 tablet (70 mg total) by mouth every 7 (seven) days. Take with a full glass of water on an empty stomach. 4 tablet 11   azelastine (ASTELIN) 0.1 % nasal spray TWO SPRAYS IN EACH NOSTRIL TWICE A DAY IF NEEDED 30 mL 5  benzonatate (TESSALON) 100 MG capsule Take 1 capsule (100 mg total) by mouth 3 (three) times daily as needed for cough. 30 capsule 0   busPIRone (BUSPAR) 15 MG tablet Take 2 tablets (30 mg total) by mouth daily. 180 tablet 1   Calcium Carb-Cholecalciferol 600-10 MG-MCG TABS Take by mouth.     [START ON 07/01/2022] clonazePAM (KLONOPIN) 0.5 MG tablet TAKE 1 TABLET BY MOUTH EVERY DAY AS NEEDED FOR ANXIETY 30 tablet 2   doxycycline (VIBRA-TABS) 100 MG tablet Take 1 tablet (100 mg total) by mouth 2 (two) times daily. 14 tablet 0   EPINEPHrine (EPIPEN 2-PAK) 0.3 mg/0.3 mL IJ SOAJ injection Inject 0.3 mg into the  muscle as needed for anaphylaxis. 2 each 1   lamoTRIgine (LAMICTAL) 150 MG tablet Take 2 tablets (300 mg total) by mouth daily. 180 tablet 1   lisinopril (ZESTRIL) 5 MG tablet TAKE 1 TABLET (5 MG TOTAL) BY MOUTH DAILY. 90 tablet 1   loratadine (CLARITIN) 10 MG tablet TAKE 1 TABLET BY MOUTH EVERY DAY 90 tablet 1   Melatonin 3 MG TABS Take 3 mg by mouth at bedtime. Reported on 11/05/2015     montelukast (SINGULAIR) 10 MG tablet TAKE 1 TABLET BY MOUTH EVERYDAY AT BEDTIME 30 tablet 0   omeprazole (PRILOSEC) 20 MG capsule TAKE 1 CAPSULE BY MOUTH EVERY DAY 90 capsule 1   sertraline (ZOLOFT) 100 MG tablet Take 2 tablets (200 mg total) by mouth daily. 180 tablet 1   simvastatin (ZOCOR) 40 MG tablet TAKE 1 TABLET BY MOUTH EVERYDAY AT BEDTIME 90 tablet 1   temazepam (RESTORIL) 15 MG capsule Take 1-2 caps po QHS prn 30 capsule 0   No current facility-administered medications for this visit.    Medication Side Effects: None  Allergies:  Allergies  Allergen Reactions   Other Anaphylaxis    Shellfish-Anaphylaxis   Codeine     nausea   Dog Epithelium     Past Medical History:  Diagnosis Date   Anxiety    takes Zoloft daily;takes Clonazepam daily as needed   Arthritis    Chronic back pain    stenosis   Diarrhea    occasionally   GERD (gastroesophageal reflux disease)    takes Omeprazole daily    History of blood clots 10 yrs ago   left calf after a bone break   History of bronchitis 2014   History of colon polyps    benign   Hyperlipidemia    takes Simvastatin daily   Hypertension    Insomnia    takes Melatonin nightly   Joint pain    Seasonal allergies    takes Claritin daily;Nasonex and ProAir as needed;Singulair daily   Weakness    numbness and tingling in right leg    Past Medical History, Surgical history, Social history, and Family history were reviewed and updated as appropriate.   Please see review of systems for further details on the patient's review from today.    Objective:   Physical Exam:  There were no vitals taken for this visit.  Physical Exam Constitutional:      General: She is not in acute distress. Musculoskeletal:        General: No deformity.  Neurological:     Mental Status: She is alert and oriented to person, place, and time.     Coordination: Coordination normal.  Psychiatric:        Attention and Perception: Attention and perception normal. She does not perceive auditory or visual  hallucinations.        Mood and Affect: Mood normal. Mood is not anxious or depressed. Affect is not labile, blunt, angry or inappropriate.        Speech: Speech normal.        Behavior: Behavior normal.        Thought Content: Thought content normal. Thought content is not paranoid or delusional. Thought content does not include homicidal or suicidal ideation. Thought content does not include homicidal or suicidal plan.        Cognition and Memory: Cognition and memory normal.        Judgment: Judgment normal.     Comments: Insight intact     Lab Review:     Component Value Date/Time   NA 135 03/19/2022 1044   K 4.3 03/19/2022 1044   CL 102 03/19/2022 1044   CO2 25 03/19/2022 1044   GLUCOSE 87 03/19/2022 1044   BUN 16 03/19/2022 1044   CREATININE 0.76 03/19/2022 1044   CALCIUM 9.0 03/19/2022 1044   PROT 6.6 03/19/2022 1044   ALBUMIN 4.4 03/19/2022 1044   AST 14 03/19/2022 1044   ALT 14 03/19/2022 1044   ALKPHOS 134 (H) 03/19/2022 1044   BILITOT 0.3 03/19/2022 1044   GFRNONAA >90 10/03/2014 1027   GFRAA >90 10/03/2014 1027       Component Value Date/Time   WBC 6.4 03/19/2022 1044   RBC 4.89 03/19/2022 1044   HGB 13.5 03/19/2022 1044   HCT 41.4 03/19/2022 1044   PLT 308.0 03/19/2022 1044   MCV 84.7 03/19/2022 1044   MCH 29.2 10/03/2014 1027   MCHC 32.7 03/19/2022 1044   RDW 14.5 03/19/2022 1044    No results found for: "POCLITH", "LITHIUM"   No results found for: "PHENYTOIN", "PHENOBARB", "VALPROATE", "CBMZ"    .res Assessment: Plan:    Pt seen for 30 minutes and time spent discussing response to treatment and recent stressors with medical issues. She reports that overall her mood and anxiety symptoms are well controlled at this time. Will continue current plan of care without changes at this time.  Continue Buspar 30 mg po BID for anxiety. Continue Lamictal 300 mg po qd for mood symptoms.  Continue Sertraline 200 mg po qd for anxiety and depression.  Continue Klonopin 0.5 mg po qd prn anxiety.  She continues to have Temazepam remaining from script last filled in November 2022 and typically takes Temazepam prn only when traveling.  Pt to follow-up in 4 months or sooner if clinically indicated.  Patient advised to contact office with any questions, adverse effects, or acute worsening in signs and symptoms.   Ashley Craig was seen today for follow-up.  Diagnoses and all orders for this visit:  Generalized anxiety disorder -     busPIRone (BUSPAR) 15 MG tablet; Take 2 tablets (30 mg total) by mouth daily. -     clonazePAM (KLONOPIN) 0.5 MG tablet; TAKE 1 TABLET BY MOUTH EVERY DAY AS NEEDED FOR ANXIETY  Depression, unspecified depression type  Insomnia, unspecified type     Please see After Visit Summary for patient specific instructions.  Future Appointments  Date Time Provider Department Center  09/14/2022 12:45 PM Corie Chiquito, PMHNP [facility] None     No orders of the defined types were placed in this encounter.   -------------------------------

## 2022-05-22 ENCOUNTER — Other Ambulatory Visit: Payer: Self-pay | Admitting: Orthopedic Surgery

## 2022-05-22 DIAGNOSIS — M48062 Spinal stenosis, lumbar region with neurogenic claudication: Secondary | ICD-10-CM

## 2022-05-28 ENCOUNTER — Ambulatory Visit
Admission: RE | Admit: 2022-05-28 | Discharge: 2022-05-28 | Disposition: A | Discharge status: Home / Self Care | Payer: Medicare HMO | Source: Ambulatory Visit | Attending: Orthopedic Surgery | Admitting: Orthopedic Surgery

## 2022-05-28 DIAGNOSIS — M48062 Spinal stenosis, lumbar region with neurogenic claudication: Secondary | ICD-10-CM

## 2022-05-28 MED ORDER — ONDANSETRON HCL 4 MG/2ML IJ SOLN: 4.0000 mg | Freq: Once | INTRAMUSCULAR | Status: DC | PRN

## 2022-05-28 MED ORDER — DIAZEPAM 5 MG PO TABS: 5.0000 mg | ORAL_TABLET | Freq: Once | ORAL | Status: DC

## 2022-05-28 MED ORDER — IOPAMIDOL (ISOVUE-M 200) INJECTION 41%
18.0000 mL | Freq: Once | INTRAMUSCULAR | Status: AC
  Administered 2022-05-28: 18 mL via INTRATHECAL

## 2022-05-28 MED ORDER — MEPERIDINE HCL 50 MG/ML IJ SOLN: 50.0000 mg | Freq: Once | INTRAMUSCULAR | Status: DC | PRN

## 2022-05-28 NOTE — Discharge Instructions (Signed)

## 2022-06-01 ENCOUNTER — Encounter: Payer: Self-pay | Admitting: Family Medicine

## 2022-06-01 MED ORDER — ALBUTEROL SULFATE HFA 108 (90 BASE) MCG/ACT IN AERS
2.0000 | INHALATION_SPRAY | RESPIRATORY_TRACT | 9 refills | Status: DC | PRN
Start: 2022-06-01 — End: 2023-04-01

## 2022-06-01 MED ORDER — MONTELUKAST SODIUM 10 MG PO TABS
10.0000 mg | ORAL_TABLET | Freq: Every day | ORAL | 3 refills | Status: DC
Start: 2022-06-01 — End: 2023-05-18

## 2022-06-01 MED ORDER — AZELASTINE HCL 0.1 % NA SOLN: NASAL | 99 refills | Status: DC

## 2022-06-01 NOTE — Telephone Encounter (Signed)
Patient requesting that I fill her allergy medications as her allergist has retired  Would you consider treating me for allergies. Dr. Beaulah Dinning was my doctor until his retirement. Since his retirement, I have been unable to get a doctor from his former practice.  I am no longer taking shots. All I need is to fill these prescriptions which I have been taking for years.  Will you help me?           HYDROCHLORIDE  1. MONTELUKAST SOD 10 MG        2. ABUTEROL SULFATE HFA INHALATION AEROSOL as needed     3. AZELASTINE HYDROCHLORIDE NASAL SPRAY 0.1%  as needed.Marland KitchenMarland KitchenTHANKS FOR YOUR CONSIDERATION.....Ashley Craig

## 2022-08-06 ENCOUNTER — Other Ambulatory Visit: Payer: Self-pay | Admitting: Psychiatry

## 2022-08-06 DIAGNOSIS — F411 Generalized anxiety disorder: Secondary | ICD-10-CM

## 2022-08-06 DIAGNOSIS — F32A Depression, unspecified: Secondary | ICD-10-CM

## 2022-08-24 ENCOUNTER — Encounter: Payer: Self-pay | Admitting: Family Medicine

## 2022-08-25 MED ORDER — EPINEPHRINE 0.3 MG/0.3ML IJ SOAJ: 0.3000 mg | INTRAMUSCULAR | 1 refills | Status: DC | PRN

## 2022-09-14 ENCOUNTER — Encounter: Payer: Self-pay | Admitting: Psychiatry

## 2022-09-14 ENCOUNTER — Ambulatory Visit: Payer: Medicare HMO | Admitting: Psychiatry

## 2022-09-14 DIAGNOSIS — G4700 Insomnia, unspecified: Secondary | ICD-10-CM

## 2022-09-14 DIAGNOSIS — F32A Depression, unspecified: Secondary | ICD-10-CM

## 2022-09-14 DIAGNOSIS — F411 Generalized anxiety disorder: Secondary | ICD-10-CM

## 2022-09-14 MED ORDER — BUSPIRONE HCL 15 MG PO TABS
30.0000 mg | ORAL_TABLET | Freq: Every day | ORAL | 1 refills | Status: DC
Start: 2022-09-14 — End: 2023-05-13

## 2022-09-14 NOTE — Progress Notes (Unsigned)
Ashley Craig [id number] 1950-11-15 71 y.o.  Subjective:   Patient ID:  Ashley Craig is a 71 y.o. (DOB 1951-08-25) female.  Chief Complaint: No chief complaint on file.   HPI Ashley Craig presents to the office today for follow-up of depression, anxiety, and insomnia. She reports getting "discouraged" with different health issues. She has some anxiety in response to health issues and physical limitations- "I don't know what my future is." She reports that recovery from back surgery has taken longer than expected.  She reports that she has continued to grieve the loss of her brother who died in 07/24/2021.   She reports that she has had some depression "but it's short lived." She reports anxiety at times with feeling "overwhelmed." She reports that she is less anxious when she is busy. Energy seems ok and "I want to do," but is limited by physical issues. She reports that her concentration has been disrupted and has not been able to recall what she has read. She has to be intentional about focusing with driving. Appetite has been good. Denies SI.   She just learned that family are coming to visit her the day after Thanksgiving. She is looking forward to family coming.   She has a new oven. She has done some painting and got a generator.   She has been active in [employer] and attends church. 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   Klonopin last filled 06/08/22.   Past Psychiatric Medication Trials: Klonopin- Effective for anxiety. She reports that she has gone weeks without taking it and takes more of it during times of increased stress Lamictal- Has helped stabilize mood. Reports that she has been on higher and lower doses. Has taken at least one year.  Effexor XR- Severe discontinuation s/s.  Prozac Zoloft- Has taken long-term Temazepam- Has used prn when traveling Gabapentin- Difficulty with balance. Took for pain. Buspar  PHQ2-9     Flowsheet Row Clinical Support from 11/10/2019 in [facility]  PHQ-2 Total Score 0      Flowsheet Row ED from 09/28/2021 in [facility]  C-SSRS RISK CATEGORY No Risk        Review of Systems:  Review of Systems  Musculoskeletal:  Positive for back pain. Negative for gait problem.       Difficulty going from a sitting to a standing position. She has an apt on Thurs to see specialist.  Psychiatric/Behavioral:         Please refer to HPI    Medications: I have reviewed the patient's current medications.  Current Outpatient Medications  Medication Sig Dispense Refill   lamoTRIgine (LAMICTAL) 150 MG tablet TAKE 2 TABLETS BY MOUTH DAILY. 180 tablet 0   sertraline (ZOLOFT) 100 MG tablet TAKE 2 TABLETS BY MOUTH EVERY DAY 180 tablet 0   acetaminophen (TYLENOL) 500 MG tablet Take 500 mg by mouth every 6 (six) hours as needed.     albuterol (PROVENTIL) (2.5 MG/3ML) 0.083% nebulizer solution TAKE 3 MLS BY NEBULIZATION EVERY 6 (SIX) HOURS AS NEEDED FOR WHEEZING OR SHORTNESS OF BREATH. 75 mL 1   albuterol (VENTOLIN HFA) 108 (90 Base) MCG/ACT inhaler Inhale 2 puffs into the lungs every 4 (four) hours as needed for wheezing or shortness of breath. 18 g 9   alendronate (FOSAMAX) 70 MG tablet Take 1 tablet (70 mg total) by mouth every 7 (seven) days. Take  with a full glass of water on an empty stomach. 4 tablet 11   azelastine (ASTELIN) 0.1 % nasal spray TWO SPRAYS IN EACH NOSTRIL TWICE A DAY IF NEEDED 30 mL PRN   benzonatate (TESSALON) 100 MG capsule Take 1 capsule (100 mg total) by mouth 3 (three) times daily as needed for cough. 30 capsule 0   busPIRone (BUSPAR) 15 MG tablet Take 2 tablets (30 mg total) by mouth daily. 180 tablet 1   Calcium Carb-Cholecalciferol 600-10 MG-MCG TABS Take by mouth.     clonazePAM (KLONOPIN) 0.5 MG tablet TAKE 1 TABLET BY MOUTH EVERY DAY AS NEEDED FOR ANXIETY 30 tablet 2   doxycycline  (VIBRA-TABS) 100 MG tablet Take 1 tablet (100 mg total) by mouth 2 (two) times daily. 14 tablet 0   EPINEPHrine (EPIPEN 2-PAK) 0.3 mg/0.3 mL IJ SOAJ injection Inject 0.3 mg into the muscle as needed for anaphylaxis. 2 each 1   lisinopril (ZESTRIL) 5 MG tablet TAKE 1 TABLET (5 MG TOTAL) BY MOUTH DAILY. 90 tablet 1   loratadine (CLARITIN) 10 MG tablet TAKE 1 TABLET BY MOUTH EVERY DAY 90 tablet 1   Melatonin 3 MG TABS Take 3 mg by mouth at bedtime. Reported on 11/05/2015     montelukast (SINGULAIR) 10 MG tablet Take 1 tablet (10 mg total) by mouth daily. 90 tablet 3   omeprazole (PRILOSEC) 20 MG capsule TAKE 1 CAPSULE BY MOUTH EVERY DAY 90 capsule 1   simvastatin (ZOCOR) 40 MG tablet TAKE 1 TABLET BY MOUTH EVERYDAY AT BEDTIME 90 tablet 1   temazepam (RESTORIL) 15 MG capsule Take 1-2 caps po QHS prn 30 capsule 0   No current facility-administered medications for this visit.    Medication Side Effects: None  Allergies:  Allergies  Allergen Reactions   Other Anaphylaxis    Shellfish-Anaphylaxis   Codeine     nausea   Dog Epithelium     Past Medical History:  Diagnosis Date   Anxiety    takes Zoloft daily;takes Clonazepam daily as needed   Arthritis    Chronic back pain    stenosis   Diarrhea    occasionally   GERD (gastroesophageal reflux disease)    takes Omeprazole daily    History of blood clots 10 yrs ago   left calf after a bone break   History of bronchitis 2014   History of colon polyps    benign   Hyperlipidemia    takes Simvastatin daily   Hypertension    Insomnia    takes Melatonin nightly   Joint pain    Seasonal allergies    takes Claritin daily;Nasonex and ProAir as needed;Singulair daily   Weakness    numbness and tingling in right leg    Past Medical History, Surgical history, Social history, and Family history were reviewed and updated as appropriate.   Please see review of systems for further details on the patient's review from today.   Objective:    Physical Exam:  There were no vitals taken for this visit.  Physical Exam  Lab Review:     Component Value Date/Time   NA 135 03/19/2022 1044   K 4.3 03/19/2022 1044   CL 102 03/19/2022 1044   CO2 25 03/19/2022 1044   GLUCOSE 87 03/19/2022 1044   BUN 16 03/19/2022 1044   CREATININE 0.76 03/19/2022 1044   CALCIUM 9.0 03/19/2022 1044   PROT 6.6 03/19/2022 1044   ALBUMIN 4.4 03/19/2022 1044   AST 14 03/19/2022 1044  ALT 14 03/19/2022 1044   ALKPHOS 134 (H) 03/19/2022 1044   BILITOT 0.3 03/19/2022 1044   GFRNONAA >90 10/03/2014 1027   GFRAA >90 10/03/2014 1027       Component Value Date/Time   WBC 6.4 03/19/2022 1044   RBC 4.89 03/19/2022 1044   HGB 13.5 03/19/2022 1044   HCT 41.4 03/19/2022 1044   PLT 308.0 03/19/2022 1044   MCV 84.7 03/19/2022 1044   MCH 29.2 10/03/2014 1027   MCHC 32.7 03/19/2022 1044   RDW 14.5 03/19/2022 1044    No results found for: "POCLITH", "LITHIUM"   No results found for: "PHENYTOIN", "PHENOBARB", "VALPROATE", "CBMZ"   .res Assessment: Plan:    There are no diagnoses linked to this encounter.   Please see After Visit Summary for patient specific instructions.  Future Appointments  Date Time Provider Department Center  09/14/2022  2:15 PM Corie Chiquito, PMHNP CP-CP None    No orders of the defined types were placed in this encounter.   -------------------------------

## 2022-09-27 ENCOUNTER — Other Ambulatory Visit: Payer: Self-pay | Admitting: Family Medicine

## 2022-09-27 DIAGNOSIS — I10 Essential (primary) hypertension: Secondary | ICD-10-CM

## 2022-10-15 ENCOUNTER — Other Ambulatory Visit: Payer: Self-pay | Admitting: Family Medicine

## 2022-10-15 DIAGNOSIS — Z1231 Encounter for screening mammogram for malignant neoplasm of breast: Secondary | ICD-10-CM

## 2022-10-24 ENCOUNTER — Other Ambulatory Visit: Payer: Self-pay | Admitting: Family Medicine

## 2022-10-24 DIAGNOSIS — I10 Essential (primary) hypertension: Secondary | ICD-10-CM

## 2022-11-04 NOTE — Progress Notes (Unsigned)
[place] at [facility] [address], [place], [place] [address] [phone]  Date:  11/05/2022   Name:  Ashley Craig   DOB:  11-11-1950   MRN:  [id number]  PCP:  Darreld Mclean, MD    Chief Complaint: No chief complaint on file.   History of Present Illness:  Ashley Craig is a 72 y.o. very pleasant female patient who presents with the following:  Virtual visit today for concern of illness Patient location is home, my location is office.  Patient identity confirmed with 2 factors, she gives consent for virtual visit today.  The patient myself are present on the call today  Most recent visit with myself was in May for her physical History of allergies, GERD, vertigo, HTN, Fuch's corneal dystrophy, likely benign elevation of alk phos, lumbar spine disease status post surgery in February of this year, spinal cord stimulator Patient Active Problem List   Diagnosis Date Noted   Allergic rhinitis due to animal hair and dander 09/25/2019   Anaphylactic shock due to adverse food reaction 05/11/2018   Seasonal allergic conjunctivitis 05/09/2018   Impingement syndrome of left ankle 07/12/2017   Diarrhea 05/21/2017   Gastroesophageal reflux disease with esophagitis 05/21/2017   History of colon polyps 05/21/2017   Orthostatic hypotension 04/08/2017   Chronic pain of left ankle 03/30/2017   Unilateral primary osteoarthritis, left knee 11/13/2016   Allergic dermatitis 08/12/2016   Chronic dryness of both eyes 06/02/2016   Hypermetropia of both eyes 06/02/2016   Open angle with borderline findings and low glaucoma risk in both eyes 06/02/2016   Pinguecula of both eyes 06/02/2016   Presbyopia of both eyes 06/02/2016   Regular astigmatism of both eyes 06/02/2016   Cellulitis of right lower extremity 01/30/2016   Varicose veins of left lower extremity with pain 01/16/2016   Mild persistent asthma without complication 14/97/0263    Chronic venous insufficiency 08/07/2015   Spontaneous hematoma of lower leg 11/30/2014   Spinal stenosis of lumbar region 10/11/2014   Allergic rhinitis 04/27/2014   Adjustment disorder 04/27/2014   Benign essential hypertension 04/27/2014   Benign paroxysmal vertigo 04/27/2014   Chondrocostal junction syndrome 04/27/2014   Elevation of level of transaminase or lactic acid dehydrogenase (LDH) 04/27/2014   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   Overweight 04/27/2014   Spontaneous ecchymosis 04/27/2014   Tendonitis of shoulder 04/27/2014   Gastroesophageal reflux disease 03/27/2014   Anxiety 03/27/2014   Environmental allergies 03/27/2014   Hypercholesteremia 03/27/2014   Insomnia 03/27/2014   Vitamin D deficiency 03/27/2014    Past Medical History:  Diagnosis Date   Anxiety    takes Zoloft daily;takes Clonazepam daily as needed   Arthritis    Chronic back pain    stenosis   Diarrhea    occasionally   GERD (gastroesophageal reflux disease)    takes Omeprazole daily    History of blood clots 10 yrs ago   left calf after a bone break   History of bronchitis 2014   History of colon polyps    benign   Hyperlipidemia    takes Simvastatin daily   Hypertension    Insomnia    takes Melatonin nightly   Joint pain    Seasonal allergies    takes Claritin daily;Nasonex and ProAir as needed;Singulair daily   Weakness    numbness and tingling in right leg    Past Surgical History:  Procedure Laterality Date  ANAL FISSURE REPAIR     ANKLE SURGERY Left 09/2017   colonosocpy     D&C of bladder  54yr ago   ESOPHAGOGASTRODUODENOSCOPY     with ED   TONSILLECTOMY     WRIST SURGERY Right    with plate    Social History   Tobacco Use   Smoking status: Never   Smokeless tobacco: Never  Vaping Use   Vaping Use: Never used  Substance Use Topics   Alcohol use: Yes    Comment: 1-2 glasses of wine daily   Drug use: No    Family History  Problem Relation Age of Onset    Cancer Mother    Retinitis pigmentosa Father    Depression Father    Anxiety disorder Sister    Melanoma Brother    Anxiety disorder Brother    Anorexia nervosa Brother    Leukemia Niece    Anorexia nervosa Cousin    Allergic rhinitis Neg Hx    Angioedema Neg Hx    Asthma Neg Hx    Eczema Neg Hx    Immunodeficiency Neg Hx    Urticaria Neg Hx    Breast cancer Neg Hx     Allergies  Allergen Reactions   Other Anaphylaxis    Shellfish-Anaphylaxis   Codeine     nausea   Dog Epithelium     Medication list has been reviewed and updated.  Current Outpatient Medications on File Prior to Visit  Medication Sig Dispense Refill   lamoTRIgine (LAMICTAL) 150 MG tablet TAKE 2 TABLETS BY MOUTH DAILY. 180 tablet 0   sertraline (ZOLOFT) 100 MG tablet TAKE 2 TABLETS BY MOUTH EVERY DAY 180 tablet 0   acetaminophen (TYLENOL) 500 MG tablet Take 500 mg by mouth every 6 (six) hours as needed.     albuterol (PROVENTIL) (2.5 MG/3ML) 0.083% nebulizer solution TAKE 3 MLS BY NEBULIZATION EVERY 6 (SIX) HOURS AS NEEDED FOR WHEEZING OR SHORTNESS OF BREATH. 75 mL 1   albuterol (VENTOLIN HFA) 108 (90 Base) MCG/ACT inhaler Inhale 2 puffs into the lungs every 4 (four) hours as needed for wheezing or shortness of breath. 18 g 9   alendronate (FOSAMAX) 70 MG tablet Take 1 tablet (70 mg total) by mouth every 7 (seven) days. Take with a full glass of water on an empty stomach. 4 tablet 11   azelastine (ASTELIN) 0.1 % nasal spray TWO SPRAYS IN EACH NOSTRIL TWICE A DAY IF NEEDED 30 mL PRN   benzonatate (TESSALON) 100 MG capsule Take 1 capsule (100 mg total) by mouth 3 (three) times daily as needed for cough. 30 capsule 0   busPIRone (BUSPAR) 15 MG tablet Take 2 tablets (30 mg total) by mouth daily. 180 tablet 1   Calcium Carb-Cholecalciferol 600-10 MG-MCG TABS Take by mouth.     clonazePAM (KLONOPIN) 0.5 MG tablet TAKE 1 TABLET BY MOUTH EVERY DAY AS NEEDED FOR ANXIETY 30 tablet 2   doxycycline (VIBRA-TABS) 100 MG  tablet Take 1 tablet (100 mg total) by mouth 2 (two) times daily. 14 tablet 0   EPINEPHrine (EPIPEN 2-PAK) 0.3 mg/0.3 mL IJ SOAJ injection Inject 0.3 mg into the muscle as needed for anaphylaxis. 2 each 1   lisinopril (ZESTRIL) 5 MG tablet TAKE 1 TABLET (5 MG TOTAL) BY MOUTH DAILY. 90 tablet 1   loratadine (CLARITIN) 10 MG tablet TAKE 1 TABLET BY MOUTH EVERY DAY 90 tablet 1   Melatonin 3 MG TABS Take 3 mg by mouth at bedtime. Reported on  11/05/2015     montelukast (SINGULAIR) 10 MG tablet Take 1 tablet (10 mg total) by mouth daily. 90 tablet 3   omeprazole (PRILOSEC) 20 MG capsule TAKE 1 CAPSULE BY MOUTH EVERY DAY 90 capsule 1   simvastatin (ZOCOR) 40 MG tablet TAKE 1 TABLET BY MOUTH EVERYDAY AT BEDTIME 90 tablet 1   temazepam (RESTORIL) 15 MG capsule Take 1-2 caps po QHS prn 30 capsule 0   No current facility-administered medications on file prior to visit.    Review of Systems:  As per HPI- otherwise negative.   Physical Examination: There were no vitals filed for this visit. There were no vitals filed for this visit. There is no height or weight on file to calculate BMI. Ideal Body Weight:    ***  Assessment and Plan: ***  Signed Lamar Blinks, MD

## 2022-11-05 ENCOUNTER — Encounter: Payer: Self-pay | Admitting: Family Medicine

## 2022-11-05 ENCOUNTER — Telehealth (INDEPENDENT_AMBULATORY_CARE_PROVIDER_SITE_OTHER): Payer: Medicare HMO | Admitting: Family Medicine

## 2022-11-05 DIAGNOSIS — J019 Acute sinusitis, unspecified: Secondary | ICD-10-CM | POA: Diagnosis not present

## 2022-11-05 DIAGNOSIS — I10 Essential (primary) hypertension: Secondary | ICD-10-CM | POA: Diagnosis not present

## 2022-11-05 MED ORDER — AMOXICILLIN 500 MG PO CAPS: 1000.0000 mg | ORAL_CAPSULE | Freq: Two times a day (BID) | ORAL | 0 refills | Status: DC

## 2022-11-05 MED ORDER — LISINOPRIL 5 MG PO TABS: 5.0000 mg | ORAL_TABLET | Freq: Every day | ORAL | 3 refills | Status: DC

## 2022-11-07 ENCOUNTER — Emergency Department (HOSPITAL_BASED_OUTPATIENT_CLINIC_OR_DEPARTMENT_OTHER)
Admission: EM | Admit: 2022-11-07 | Discharge: 2022-11-07 | Disposition: A | Discharge status: Home / Self Care | Payer: Medicare HMO | Attending: Emergency Medicine | Admitting: Emergency Medicine

## 2022-11-07 ENCOUNTER — Emergency Department (HOSPITAL_BASED_OUTPATIENT_CLINIC_OR_DEPARTMENT_OTHER): Payer: Medicare HMO

## 2022-11-07 ENCOUNTER — Other Ambulatory Visit: Payer: Self-pay

## 2022-11-07 ENCOUNTER — Encounter (HOSPITAL_BASED_OUTPATIENT_CLINIC_OR_DEPARTMENT_OTHER): Payer: Self-pay | Admitting: Emergency Medicine

## 2022-11-07 DIAGNOSIS — W108XXA Fall (on) (from) other stairs and steps, initial encounter: Secondary | ICD-10-CM | POA: Insufficient documentation

## 2022-11-07 DIAGNOSIS — S0101XA Laceration without foreign body of scalp, initial encounter: Secondary | ICD-10-CM

## 2022-11-07 DIAGNOSIS — S0990XA Unspecified injury of head, initial encounter: Secondary | ICD-10-CM | POA: Diagnosis present

## 2022-11-07 MED ORDER — LIDOCAINE-EPINEPHRINE (PF) 2 %-1:200000 IJ SOLN
10.0000 mL | Freq: Once | INTRAMUSCULAR | Status: AC
  Administered 2022-11-07: 10 mL via INTRADERMAL
  Filled 2022-11-07: qty 20

## 2022-11-07 NOTE — ED Notes (Signed)
Wound care and washed hair around wound of matted blood with NS and peroxide.

## 2022-11-07 NOTE — ED Notes (Signed)
ED Provider at bedside. 

## 2022-11-07 NOTE — ED Triage Notes (Signed)
Pt fell backward onto cement surface just PTA; hit posterior head; no LOC, no thinners; hematoma and dried blood noted to posterior head; also feels that jaw is "off"

## 2022-11-07 NOTE — ED Provider Notes (Signed)
[facility] EMERGENCY DEPARTMENT Provider Note   CSN: [id number] Arrival date & time: 11/07/22  1720     History  Chief Complaint  Patient presents with   Head Injury    OSMARA DRUMMONDS is a 72 y.o. female.  72 yo F with a chief complaints of a fall.  The patient was on her front steps and she had turned quickly and lost her balance and tried to grab a rail which was wet and she fell and struck the back of her head.  She felt like her jaw was not fitting together range but does think that that is a bit better now.  She denies confusion denies vomiting denies chest pain abdominal pain back pain neck pain.  She has some bruising to her right hand and forearm that she tells me does not hurt.  Denies blood thinner use.   Head Injury      Home Medications Prior to Admission medications   Medication Sig Start Date End Date Taking? Authorizing Provider  lamoTRIgine (LAMICTAL) 150 MG tablet TAKE 2 TABLETS BY MOUTH DAILY. 08/06/22   Corie Chiquito, PMHNP  sertraline (ZOLOFT) 100 MG tablet TAKE 2 TABLETS BY MOUTH EVERY DAY 08/06/22   Corie Chiquito, PMHNP  acetaminophen (TYLENOL) 500 MG tablet Take 500 mg by mouth every 6 (six) hours as needed.    [provider]  albuterol (PROVENTIL) (2.5 MG/3ML) 0.083% nebulizer solution TAKE 3 MLS BY NEBULIZATION EVERY 6 (SIX) HOURS AS NEEDED FOR WHEEZING OR SHORTNESS OF BREATH. 04/15/21   Ambs, Norvel Richards, FNP  albuterol (VENTOLIN HFA) 108 (90 Base) MCG/ACT inhaler Inhale 2 puffs into the lungs every 4 (four) hours as needed for wheezing or shortness of breath. 06/01/22   Copland, Gwenlyn Found, MD  alendronate (FOSAMAX) 70 MG tablet Take 1 tablet (70 mg total) by mouth every 7 (seven) days. Take with a full glass of water on an empty stomach. 04/02/22   Copland, Gwenlyn Found, MD  amoxicillin (AMOXIL) 500 MG capsule Take 2 capsules (1,000 mg total) by mouth 2 (two) times daily. 11/05/22   Copland, Gwenlyn Found, MD  azelastine (ASTELIN) 0.1 % nasal spray  TWO SPRAYS IN EACH NOSTRIL TWICE A DAY IF NEEDED 06/01/22   Copland, Gwenlyn Found, MD  benzonatate (TESSALON) 100 MG capsule Take 1 capsule (100 mg total) by mouth 3 (three) times daily as needed for cough. 04/03/22   Waldon Merl, PA-C  busPIRone (BUSPAR) 15 MG tablet Take 2 tablets (30 mg total) by mouth daily. 09/14/22   Corie Chiquito, PMHNP  Calcium Carb-Cholecalciferol 600-10 MG-MCG TABS Take by mouth.    [provider]  clonazePAM (KLONOPIN) 0.5 MG tablet TAKE 1 TABLET BY MOUTH EVERY DAY AS NEEDED FOR ANXIETY 07/01/22   Corie Chiquito, PMHNP  EPINEPHrine (EPIPEN 2-PAK) 0.3 mg/0.3 mL IJ SOAJ injection Inject 0.3 mg into the muscle as needed for anaphylaxis. 08/25/22   Copland, Gwenlyn Found, MD  lisinopril (ZESTRIL) 5 MG tablet Take 1 tablet (5 mg total) by mouth daily. 11/05/22   Copland, Gwenlyn Found, MD  loratadine (CLARITIN) 10 MG tablet TAKE 1 TABLET BY MOUTH EVERY DAY 10/09/19   Ambs, Norvel Richards, FNP  Melatonin 3 MG TABS Take 3 mg by mouth at bedtime. Reported on 11/05/2015    [provider]  montelukast (SINGULAIR) 10 MG tablet Take 1 tablet (10 mg total) by mouth daily. 06/01/22   Copland, Gwenlyn Found, MD  omeprazole (PRILOSEC) 20 MG capsule TAKE 1 CAPSULE BY MOUTH  EVERY DAY 02/09/22   Hetty Blend, FNP  simvastatin (ZOCOR) 40 MG tablet TAKE 1 TABLET BY MOUTH EVERYDAY AT BEDTIME 10/27/22   Copland, Gwenlyn Found, MD  temazepam (RESTORIL) 15 MG capsule Take 1-2 caps po QHS prn 09/22/21   Corie Chiquito, PMHNP      Allergies    Other, Codeine, and Dog epithelium    Review of Systems   Review of Systems  Physical Exam Updated Vital Signs BP 138/77 (BP Location: Right Arm)   Pulse 70   Temp 98.3 F (36.8 C) (Oral)   Resp 20   Ht 5\' 7"  (1.702 m)   Wt 86.2 kg   SpO2 97%   BMI 29.76 kg/m  Physical Exam Vitals and nursing note reviewed.  Constitutional:      General: She is not in acute distress.    Appearance: She is well-developed. She is not diaphoretic.  HENT:     Head:  Normocephalic.     Comments: Hematoma to the left occiput, laceration approximately 2.6 cm. Eyes:     Pupils: Pupils are equal, round, and reactive to light.  Cardiovascular:     Rate and Rhythm: Normal rate and regular rhythm.     Heart sounds: No murmur heard.    No friction rub. No gallop.  Pulmonary:     Effort: Pulmonary effort is normal.     Breath sounds: No wheezing or rales.  Abdominal:     General: There is no distension.     Palpations: Abdomen is soft.     Tenderness: There is no abdominal tenderness.  Musculoskeletal:        General: No tenderness.     Cervical back: Normal range of motion and neck supple.     Comments: There is a skin tear to the volar aspect of the right forearm.  Approximately dime sized.  There is some bruising about the fifth metacarpal on the right without obvious bony tenderness.  There is some scattered abrasion to the right forearm proximal to the skin tear.  No obvious pain at the elbow or the humerus.  No midline spinal tenderness step-offs or deformities.  Palpated from head to toe without any other noted areas of bony tenderness.  Skin:    General: Skin is warm and dry.  Neurological:     Mental Status: She is alert and oriented to person, place, and time.  Psychiatric:        Behavior: Behavior normal.     ED Results / Procedures / Treatments   Labs (all labs ordered are listed, but only abnormal results are displayed) Labs Reviewed - No data to display  EKG None  Radiology CT Maxillofacial Wo Contrast  Result Date: 11/07/2022 CLINICAL DATA:  Trauma EXAM: CT MAXILLOFACIAL WITHOUT CONTRAST TECHNIQUE: Multidetector CT imaging of the maxillofacial structures was performed. Multiplanar CT image reconstructions were also generated. RADIATION DOSE REDUCTION: This exam was performed according to the departmental dose-optimization program which includes automated exposure control, adjustment of the mA and/or kV according to patient size and/or  use of iterative reconstruction technique. COMPARISON:  None Available. FINDINGS: No acute osseous abnormalities are identified fracture, dislocation subluxation. Orbital contents unremarkable. Mucoperiosteal thickening consistent with chronic ethmoid and sphenoid sinusitis. There are air-fluid levels in the maxillary sinuses consistent with acute sinusitis. IMPRESSION: 1. No acute traumatic abnormalities. 2. Acute on chronic sinusitis. Electronically Signed   By: 01/06/2023 M.D.   On: 11/07/2022 18:37   CT Head Wo Contrast  Addendum  Date: 11/07/2022   ADDENDUM REPORT: 11/07/2022 18:35 ADDENDUM: Left occipital scalp soft tissue swelling noted consistent with cephalohematoma. Electronically Signed   By: Layla Maw M.D.   On: 11/07/2022 18:35   Result Date: 11/07/2022 CLINICAL DATA:  Head trauma. EXAM: CT HEAD WITHOUT CONTRAST TECHNIQUE: Contiguous axial images were obtained from the base of the skull through the vertex without intravenous contrast. RADIATION DOSE REDUCTION: This exam was performed according to the departmental dose-optimization program which includes automated exposure control, adjustment of the mA and/or kV according to patient size and/or use of iterative reconstruction technique. COMPARISON:  None Available. FINDINGS: Brain: There is periventricular white matter decreased attenuation consistent with small vessel ischemic changes. Ventricles, sulci and cisterns are prominent consistent with age related involutional changes. No acute intracranial hemorrhage, mass effect or shift. No hydrocephalus. Vascular: No hyperdense vessel or unexpected calcification. Skull: Normal. Negative for fracture or focal lesion. IMPRESSION: Atrophy and chronic small vessel ischemic changes. No acute intracranial process identified. Electronically Signed: By: Layla Maw M.D. On: 11/07/2022 18:21   CT Cervical Spine Wo Contrast  Result Date: 11/07/2022 CLINICAL DATA:  Trauma EXAM: CT CERVICAL  SPINE WITHOUT CONTRAST TECHNIQUE: Multidetector CT imaging of the cervical spine was performed without intravenous contrast. Multiplanar CT image reconstructions were also generated. RADIATION DOSE REDUCTION: This exam was performed according to the departmental dose-optimization program which includes automated exposure control, adjustment of the mA and/or kV according to patient size and/or use of iterative reconstruction technique. COMPARISON:  None Available. FINDINGS: Alignment: Normal. Skull base and vertebrae: No acute fracture. No primary bone lesion or focal pathologic process. Soft tissues and spinal canal: No prevertebral fluid or swelling. No visible canal hematoma. Disc levels: Disc space narrowing with marginal osteophyte formation noted C3-4 through C6-7. Neural foraminal encroachment noted on the basis of marginal osteophytes and uncovertebral joint hypertrophic changes on the left at C3-4 through C5-6 and on the right C4-5. 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 Upper chest: Negative. Other: None. IMPRESSION: Degenerative changes.  No acute traumatic abnormalities. Electronically Signed   By: Layla Maw M.D.   On: 11/07/2022 18:24    Procedures .Marland KitchenLaceration Repair  Date/Time: 11/07/2022 7:43 PM  Performed by: Melene Plan, DO Authorized by: Melene Plan, DO   Consent:    Consent obtained:  Verbal   Consent given by:  Patient   Risks, benefits, and alternatives were discussed: yes     Risks discussed:  Infection, poor cosmetic result, pain and poor wound healing   Alternatives discussed:  No treatment, delayed treatment and observation Universal protocol:    Procedure explained and questions answered to patient or proxy's satisfaction: yes     Patient identity confirmed:  Verbally with patient Anesthesia:    Anesthesia method:  Local infiltration   Local anesthetic:  Lidocaine 2% WITH epi Laceration details:    Location:  Scalp   Scalp location:  Occipital   Length (cm):  2.8 Pre-procedure  details:    Preparation:  Patient was prepped and draped in usual sterile fashion and imaging obtained to evaluate for foreign bodies Exploration:    Limited defect created (wound extended): no     Hemostasis achieved with:  Epinephrine and direct pressure   Imaging obtained comment:  CT   Imaging outcome: foreign body not noted     Wound exploration: entire depth of wound visualized     Wound extent: no underlying fracture     Contaminated: no   Treatment:    Area cleansed with:  Saline  Amount of cleaning:  Extensive   Irrigation solution:  Sterile saline   Irrigation volume:  50   Visualized foreign bodies/material removed: no     Debridement:  None   Undermining:  None   Scar revision: no   Skin repair:    Repair method:  Staples   Number of staples:  3 Approximation:    Approximation:  Close Repair type:    Repair type:  Simple Post-procedure details:    Dressing:  Open (no dressing)   Procedure completion:  Tolerated well, no immediate complications     Medications Ordered in ED Medications  lidocaine-EPINEPHrine (XYLOCAINE W/EPI) 2 %-1:200000 (PF) injection 10 mL (10 mLs Intradermal Given by Other 11/07/22 1845)    ED Course/ Medical Decision Making/ A&P                           Medical Decision Making Amount and/or Complexity of Data Reviewed Radiology: ordered.  Risk Prescription drug management.   72 yo F with a chief complaint of a fall.  Nonsyncopal by history.  Complaining mostly of head pain.  Obtain a CT of the head face and C-spine.  Repair wound at bedside.  CT of the head independently interpreted by me without intracranial hemorrhage.  Due to the face without obvious jaw fracture.  CT C-spine negative.  Discharged home.  PCP follow-up.  7:44 PM:  I have discussed the diagnosis/risks/treatment options with the patient.  Evaluation and diagnostic testing in the emergency department does not suggest an emergent condition requiring admission or  immediate intervention beyond what has been performed at this time.  They will follow up with PCP. We also discussed returning to the ED immediately if new or worsening sx occur. We discussed the sx which are most concerning (e.g., sudden worsening pain, fever, inability to tolerate by mouth, confusion, vomiting, redness, drainage) that necessitate immediate return. Medications administered to the patient during their visit and any new prescriptions provided to the patient are listed below.  Medications given during this visit Medications  lidocaine-EPINEPHrine (XYLOCAINE W/EPI) 2 %-1:200000 (PF) injection 10 mL (10 mLs Intradermal Given by Other 11/07/22 1845)     The patient appears reasonably screen and/or stabilized for discharge and I doubt any other medical condition or other [facility] requiring further screening, evaluation, or treatment in the ED at this time prior to discharge.          Final Clinical Impression(s) / ED Diagnoses Final diagnoses:  Laceration of scalp, initial encounter    Rx / DC Orders ED Discharge Orders     None         Deno Etienne, DO 11/07/22 1944

## 2022-11-07 NOTE — Discharge Instructions (Signed)
Return for redness drainage or if you get a fever.  The area was clear staples.  This typically removed between day 7 and 10.  This can be done here at urgent care at your family doctor's office.  The area can get wet but not fully immersed underwater.  No scrubbing.  If you really want to clean it you can apply a half-and-half hydrogen peroxide solution with water on a Q-tip.  You can apply an ointment a couple times a day this could be as simple as Vaseline but could also be an antibiotic ointment if you wish.

## 2022-11-10 ENCOUNTER — Telehealth: Payer: Self-pay

## 2022-11-10 NOTE — Telephone Encounter (Signed)
Transition Care Management Unsuccessful Follow-up Telephone Call  Date of discharge and from where:  [facility], 11/07/2022  Attempts:  1st Attempt  Reason for unsuccessful TCM follow-up call:  No answer/busy

## 2022-11-12 NOTE — Telephone Encounter (Signed)
Transition Care Management Unsuccessful Follow-up Telephone Call  Date of discharge and from where:  [facility], 11/07/2022  Attempts:  2nd Attempt  Reason for unsuccessful TCM follow-up call:  No answer/busy

## 2022-11-13 NOTE — Telephone Encounter (Signed)
Transition Care Management Unsuccessful Follow-up Telephone Call  Date of discharge and from where:  [facility], 11/07/2022  Attempts:  3rd Attempt  Reason for unsuccessful TCM follow-up call:  No answer/busy

## 2022-11-23 ENCOUNTER — Other Ambulatory Visit: Payer: Self-pay | Admitting: Family Medicine

## 2022-11-24 ENCOUNTER — Other Ambulatory Visit: Payer: Self-pay | Admitting: Psychiatry

## 2022-11-24 DIAGNOSIS — F32A Depression, unspecified: Secondary | ICD-10-CM

## 2022-12-09 ENCOUNTER — Other Ambulatory Visit: Payer: Self-pay | Admitting: Psychiatry

## 2022-12-09 DIAGNOSIS — F32A Depression, unspecified: Secondary | ICD-10-CM

## 2022-12-09 DIAGNOSIS — F411 Generalized anxiety disorder: Secondary | ICD-10-CM

## 2022-12-10 ENCOUNTER — Ambulatory Visit
Admission: RE | Admit: 2022-12-10 | Discharge: 2022-12-10 | Disposition: A | Discharge status: Home / Self Care | Payer: Medicare HMO | Source: Ambulatory Visit | Attending: Family Medicine | Admitting: Family Medicine

## 2022-12-10 DIAGNOSIS — Z1231 Encounter for screening mammogram for malignant neoplasm of breast: Secondary | ICD-10-CM

## 2022-12-22 ENCOUNTER — Other Ambulatory Visit: Payer: Self-pay | Admitting: Psychiatry

## 2022-12-22 DIAGNOSIS — F32A Depression, unspecified: Secondary | ICD-10-CM

## 2022-12-22 DIAGNOSIS — F411 Generalized anxiety disorder: Secondary | ICD-10-CM

## 2022-12-24 ENCOUNTER — Other Ambulatory Visit: Payer: Self-pay | Admitting: Psychiatry

## 2022-12-24 DIAGNOSIS — F411 Generalized anxiety disorder: Secondary | ICD-10-CM

## 2023-01-13 ENCOUNTER — Encounter: Payer: Self-pay | Admitting: Psychiatry

## 2023-01-13 ENCOUNTER — Ambulatory Visit: Payer: Medicare HMO | Admitting: Psychiatry

## 2023-01-13 DIAGNOSIS — G4700 Insomnia, unspecified: Secondary | ICD-10-CM

## 2023-01-13 DIAGNOSIS — F32A Depression, unspecified: Secondary | ICD-10-CM

## 2023-01-13 DIAGNOSIS — F411 Generalized anxiety disorder: Secondary | ICD-10-CM

## 2023-01-13 MED ORDER — CLONAZEPAM 0.5 MG PO TABS: ORAL_TABLET | ORAL | 1 refills | Status: DC

## 2023-01-13 NOTE — Progress Notes (Unsigned)
Ashley Craig [id number] [birth year] 72 y.o.  Subjective:   Patient ID:  Ashley Craig is a 72 y.o. (DOB 10/20/1951) female.  Chief Complaint:  Chief Complaint  Patient presents with   Follow-up    Depression, anxiety, and insomnia    HPI Ashley Craig presents to the office today for follow-up of depression, anxiety, and insomnia.   She has had 2 spinal injections with positive response. She reports that she has also had fluid drained from her knees. She is seeing a specialist this afternoon about toe surgery.   Denies depressed mood. She reports that she has had occasional anxiety upon awakening and then it dissipates. She reports that she is sleeping well and feeling tired at the end of the day. She reports that her energy and motivation are ok and then decreases later in the day. She reports that she occasionally puts some things off. She denies putting things off that are urgent or necessary. She reports that sometimes she forgets to look at her calendar. She reports that she has not needed Klonopin prn recently. She reports that her concentration is fair. She reports that she has been able to read and focus again. Appetite has been "fine." She reports that she tends to eat them same thing every night. Denies SI.   Family has been doing well. Traveled during [place]. She did not need Temazepam while traveling.   Klonopin last filled 12/25/22.  Temazepam last filled 09/22/21.  Past Psychiatric Medication Trials: Klonopin- Effective for anxiety. She reports that she has gone weeks without taking it and takes more of it during times of increased stress Lamictal- Has helped stabilize mood. Reports that she has been on higher and lower doses. Has taken at least one year.  Effexor XR- Severe discontinuation s/s.  Prozac Zoloft- Has taken long-term Temazepam- Has used prn when traveling Gabapentin- Difficulty with balance. Took for pain. Buspar   PHQ2-9    Flowsheet Row  Clinical Support from 11/10/2019 in [facility]  PHQ-2 Total Score 0      [place] ED from 11/07/2022 in [facility] Emergency Department at [facility] ED from 09/28/2021 in [facility] Emergency Department at [place] No Risk No Risk        Review of Systems:  Review of Systems  Gastrointestinal: Negative.   Musculoskeletal:  Positive for arthralgias and back pain. Negative for gait problem.       Pain in toes  Neurological:  Negative for tremors and headaches.  Psychiatric/Behavioral:         Please refer to HPI    Medications: I have reviewed the patient's current medications.  Current Outpatient Medications  Medication Sig Dispense Refill   acetaminophen (TYLENOL) 500 MG tablet Take 500 mg by mouth every 6 (six) hours as needed.     alendronate (FOSAMAX) 70 MG tablet Take 1 tablet (70 mg total) by mouth every 7 (seven) days. Take with a full glass of water on an empty stomach. 4 tablet 11   azelastine (ASTELIN) 0.1 % nasal spray TWO SPRAYS IN EACH NOSTRIL TWICE A DAY IF NEEDED 30 mL PRN   busPIRone (BUSPAR) 15 MG tablet Take 2 tablets (30 mg total) by mouth daily. 180 tablet 1   Calcium Carb-Cholecalciferol 600-10 MG-MCG TABS Take by mouth.     lamoTRIgine (LAMICTAL) 150 MG tablet TAKE 2 TABLETS BY MOUTH EVERY DAY 180 tablet 0  lisinopril (ZESTRIL) 5 MG tablet Take 1 tablet (5 mg total) by mouth daily. 90 tablet 3   loratadine (CLARITIN) 10 MG tablet TAKE 1 TABLET BY MOUTH EVERY DAY 90 tablet 1   Melatonin 3 MG TABS Take 3 mg by mouth at bedtime. Reported on 11/05/2015     montelukast (SINGULAIR) 10 MG tablet Take 1 tablet (10 mg total) by mouth daily. 90 tablet 3   omeprazole (PRILOSEC) 20 MG capsule TAKE 1 CAPSULE BY MOUTH EVERY DAY 90 capsule 1   sertraline (ZOLOFT) 100 MG tablet TAKE 2 TABLETS BY MOUTH EVERY DAY 180 tablet 0   simvastatin (ZOCOR) 40 MG tablet TAKE 1 TABLET BY MOUTH  EVERYDAY AT BEDTIME 90 tablet 1   albuterol (PROVENTIL) (2.5 MG/3ML) 0.083% nebulizer solution TAKE 3 MLS BY NEBULIZATION EVERY 6 (SIX) HOURS AS NEEDED FOR WHEEZING OR SHORTNESS OF BREATH. 75 mL 1   albuterol (VENTOLIN HFA) 108 (90 Base) MCG/ACT inhaler Inhale 2 puffs into the lungs every 4 (four) hours as needed for wheezing or shortness of breath. 18 g 9   amoxicillin (AMOXIL) 500 MG capsule Take 2 capsules (1,000 mg total) by mouth 2 (two) times daily. (Patient not taking: Reported on 01/13/2023) 40 capsule 0   benzonatate (TESSALON) 100 MG capsule Take 1 capsule (100 mg total) by mouth 3 (three) times daily as needed for cough. (Patient not taking: Reported on 01/13/2023) 30 capsule 0   [START ON 01/22/2023] clonazePAM (KLONOPIN) 0.5 MG tablet TAKE 1 TABLET BY MOUTH EVERY DAY AS NEEDED FOR ANXIETY 30 tablet 1   EPINEPHrine (EPIPEN 2-PAK) 0.3 mg/0.3 mL IJ SOAJ injection Inject 0.3 mg into the muscle as needed for anaphylaxis. 2 each 1   temazepam (RESTORIL) 15 MG capsule Take 1-2 caps po QHS prn (Patient not taking: Reported on 01/13/2023) 30 capsule 0   No current facility-administered medications for this visit.    Medication Side Effects: None  Allergies:  Allergies  Allergen Reactions   Other Anaphylaxis    Shellfish-Anaphylaxis   Codeine     nausea   Dog Epithelium     Past Medical History:  Diagnosis Date   Anxiety    takes Zoloft daily;takes Clonazepam daily as needed   Arthritis    Chronic back pain    stenosis   Diarrhea    occasionally   GERD (gastroesophageal reflux disease)    takes Omeprazole daily    History of blood clots 10 yrs ago   left calf after a bone break   History of bronchitis 2014   History of colon polyps    benign   Hyperlipidemia    takes Simvastatin daily   Hypertension    Insomnia    takes Melatonin nightly   Joint pain    Seasonal allergies    takes Claritin daily;Nasonex and ProAir as needed;Singulair daily   Weakness    numbness and  tingling in right leg    Past Medical History, Surgical history, Social history, and Family history were reviewed and updated as appropriate.   Please see review of systems for further details on the patient's review from today.   Objective:   Physical Exam:  There were no vitals taken for this visit.  Physical Exam Constitutional:      General: She is not in acute distress. Musculoskeletal:        General: No deformity.  Neurological:     Mental Status: She is alert and oriented to person, place, and time.  Coordination: Coordination normal.  Psychiatric:        Attention and Perception: Attention and perception normal. She does not perceive auditory or visual hallucinations.        Mood and Affect: Mood normal. Mood is not anxious or depressed. Affect is not labile, blunt, angry or inappropriate.        Speech: Speech normal.        Behavior: Behavior normal.        Thought Content: Thought content normal. Thought content is not paranoid or delusional. Thought content does not include homicidal or suicidal ideation. Thought content does not include homicidal or suicidal plan.        Cognition and Memory: Cognition and memory normal.        Judgment: Judgment normal.     Comments: Insight intact     Lab Review:     Component Value Date/Time   NA 135 03/19/2022 1044   K 4.3 03/19/2022 1044   CL 102 03/19/2022 1044   CO2 25 03/19/2022 1044   GLUCOSE 87 03/19/2022 1044   BUN 16 03/19/2022 1044   CREATININE 0.76 03/19/2022 1044   CALCIUM 9.0 03/19/2022 1044   PROT 6.6 03/19/2022 1044   ALBUMIN 4.4 03/19/2022 1044   AST 14 03/19/2022 1044   ALT 14 03/19/2022 1044   ALKPHOS 134 (H) 03/19/2022 1044   BILITOT 0.3 03/19/2022 1044   GFRNONAA >90 10/03/2014 1027   GFRAA >90 10/03/2014 1027       Component Value Date/Time   WBC 6.4 03/19/2022 1044   RBC 4.89 03/19/2022 1044   HGB 13.5 03/19/2022 1044   HCT 41.4 03/19/2022 1044   PLT 308.0 03/19/2022 1044   MCV 84.7  03/19/2022 1044   MCH 29.2 10/03/2014 1027   MCHC 32.7 03/19/2022 1044   RDW 14.5 03/19/2022 1044    No results found for: "POCLITH", "LITHIUM"   No results found for: "PHENYTOIN", "PHENOBARB", "VALPROATE", "CBMZ"   .res Assessment: Plan:   Pt seen for 25 minutes and time spent discussing that it may be more beneficial to take Buspar 15 mg twice daily instead of 30 mg in the morning since it has a short half-life and clears out quickly. Discussed that taking Buspar at night may potentially help prevent anxiety upon awakening. Pt reports that she will try changing Buspar to BID. Continue Buspar 30 mg po BID for anxiety. Continue Lamictal 300 mg po qd for mood symptoms.  Continue Sertraline 200 mg po qd for anxiety and depression.  Continue Klonopin 0.5 mg po qd prn anxiety. Will not send in Temazepam at this time due to pt not needing to take Temazepam prn recently. Pt to follow-up in 4 months or sooner if clinically indicated.  Patient advised to contact office with any questions, adverse effects, or acute worsening in signs and symptoms.   Ashley Craig was seen today for follow-up.  Diagnoses and all orders for this visit:  Depression, unspecified depression type  Generalized anxiety disorder -     clonazePAM (KLONOPIN) 0.5 MG tablet; TAKE 1 TABLET BY MOUTH EVERY DAY AS NEEDED FOR ANXIETY  Insomnia, unspecified type     Please see After Visit Summary for patient specific instructions.  Future Appointments  Date Time Provider [place]  05/17/2023  1:00 PM Thayer Headings, PMHNP CP-CP None     No orders of the defined types were placed in this encounter.   -------------------------------

## 2023-02-18 ENCOUNTER — Other Ambulatory Visit: Payer: Self-pay | Admitting: Family Medicine

## 2023-02-18 DIAGNOSIS — M8580 Other specified disorders of bone density and structure, unspecified site: Secondary | ICD-10-CM

## 2023-03-03 ENCOUNTER — Other Ambulatory Visit: Payer: Self-pay | Admitting: Psychiatry

## 2023-03-03 DIAGNOSIS — F32A Depression, unspecified: Secondary | ICD-10-CM

## 2023-03-03 DIAGNOSIS — F411 Generalized anxiety disorder: Secondary | ICD-10-CM

## 2023-03-20 ENCOUNTER — Other Ambulatory Visit: Payer: Self-pay | Admitting: Family Medicine

## 2023-03-20 ENCOUNTER — Other Ambulatory Visit: Payer: Self-pay | Admitting: Psychiatry

## 2023-03-20 DIAGNOSIS — F32A Depression, unspecified: Secondary | ICD-10-CM

## 2023-03-30 NOTE — Progress Notes (Unsigned)
Healthcare at [facility] [address] [place], [place] [address] [phone]  Date:  04/01/2023   Name:  Ashley Craig   DOB:  [birth year]   MRN:  [id number]  PCP:  Pearline Cables, MD    Chief Complaint: No chief complaint on file.   History of Present Illness:  Ashley Craig is a 72 y.o. very pleasant female patient who presents with the following:  Patient seen today for physical exam Most recent visit with myself was a virtual visit in January, we did her last physical about a year ago History of allergies, GERD, vertigo, HTN, Fuch's corneal dystrophy, likely benign elevation of alk phos  She had a lumbar operation in February 2023 per Dr. Valentino Nose and has a spinal cord stimulator She is seen by Corie Chiquito at [facility] for depression and anxiety Her ophthalmologist is Dr. Quinn Axe, most recent visit last month  Mammogram is up-to-date DEXA scan 1 year ago Colonoscopy completed this year Can update routine labs today  Fosamax weekly Lisinopril 5 mg BuSpar Clonazepam Lamictal Sertraline 200 Simvastatin Singulair Omeprazole Patient Active Problem List   Diagnosis Date Noted   Allergic rhinitis due to animal hair and dander 09/25/2019   Anaphylactic shock due to adverse food reaction 05/11/2018   Seasonal allergic conjunctivitis 05/09/2018   Impingement syndrome of left ankle 07/12/2017   Diarrhea 05/21/2017   Gastroesophageal reflux disease with esophagitis 05/21/2017   History of colon polyps 05/21/2017   Orthostatic hypotension 04/08/2017   Chronic pain of left ankle 03/30/2017   Unilateral primary osteoarthritis, left knee 11/13/2016   Allergic dermatitis 08/12/2016   Chronic dryness of both eyes 06/02/2016   Hypermetropia of both eyes 06/02/2016   Open angle with borderline findings and low glaucoma risk in both eyes 06/02/2016   Pinguecula of both eyes 06/02/2016   Presbyopia of both eyes  06/02/2016   Regular astigmatism of both eyes 06/02/2016   Cellulitis of right lower extremity 01/30/2016   Varicose veins of left lower extremity with pain 01/16/2016   Mild persistent asthma without complication 11/05/2015   Chronic venous insufficiency 08/07/2015   Spontaneous hematoma of lower leg 11/30/2014   Spinal stenosis of lumbar region 10/11/2014   Allergic rhinitis 04/27/2014   Adjustment disorder 04/27/2014   Benign essential hypertension 04/27/2014   Benign paroxysmal vertigo 04/27/2014   Chondrocostal junction syndrome 04/27/2014   Elevation of level of transaminase or lactic acid dehydrogenase (LDH) 04/27/2014   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   Overweight 04/27/2014   Spontaneous ecchymosis 04/27/2014   Tendonitis of shoulder 04/27/2014   Gastroesophageal reflux disease 03/27/2014   Anxiety 03/27/2014   Environmental allergies 03/27/2014   Hypercholesteremia 03/27/2014   Insomnia 03/27/2014   Vitamin D deficiency 03/27/2014    Past Medical History:  Diagnosis Date   Anxiety    takes Zoloft daily;takes Clonazepam daily as needed   Arthritis    Chronic back pain    stenosis   Diarrhea    occasionally   GERD (gastroesophageal reflux disease)    takes Omeprazole daily    History of blood clots 10 yrs ago   left calf after a bone break   History of bronchitis 2014   History of colon polyps    benign   Hyperlipidemia    takes Simvastatin daily   Hypertension    Insomnia    takes Melatonin nightly   Joint pain    Seasonal allergies  takes Claritin daily;Nasonex and ProAir as needed;Singulair daily   Weakness    numbness and tingling in right leg    Past Surgical History:  Procedure Laterality Date   ANAL FISSURE REPAIR     ANKLE SURGERY Left 09/2017   colonosocpy     D&C of bladder  13yrs ago   ESOPHAGOGASTRODUODENOSCOPY     with ED   TONSILLECTOMY     WRIST SURGERY Right    with plate    Social History   Tobacco Use   Smoking  status: Never   Smokeless tobacco: Never  Vaping Use   Vaping Use: Never used  Substance Use Topics   Alcohol use: Yes    Comment: 1-2 glasses of wine daily   Drug use: No    Family History  Problem Relation Age of Onset   Cancer Mother    Retinitis pigmentosa Father    Depression Father    Anxiety disorder Sister    Melanoma Brother    Anxiety disorder Brother    Anorexia nervosa Brother    Leukemia Niece    Anorexia nervosa Cousin    Allergic rhinitis Neg Hx    Angioedema Neg Hx    Asthma Neg Hx    Eczema Neg Hx    Immunodeficiency Neg Hx    Urticaria Neg Hx    Breast cancer Neg Hx     Allergies  Allergen Reactions   Other Anaphylaxis    Shellfish-Anaphylaxis   Codeine     nausea   Dog Epithelium     Medication list has been reviewed and updated.  Current Outpatient Medications on File Prior to Visit  Medication Sig Dispense Refill   acetaminophen (TYLENOL) 500 MG tablet Take 500 mg by mouth every 6 (six) hours as needed.     albuterol (PROVENTIL) (2.5 MG/3ML) 0.083% nebulizer solution TAKE 3 MLS BY NEBULIZATION EVERY 6 (SIX) HOURS AS NEEDED FOR WHEEZING OR SHORTNESS OF BREATH. 75 mL 1   albuterol (VENTOLIN HFA) 108 (90 Base) MCG/ACT inhaler Inhale 2 puffs into the lungs every 4 (four) hours as needed for wheezing or shortness of breath. 18 g 9   alendronate (FOSAMAX) 70 MG tablet TAKE 1 TABLET (70 MG TOTAL) BY MOUTH EVERY 7 DAYS WITH FULL GLASS WATER ON EMPTY STOMACH 12 tablet 3   amoxicillin (AMOXIL) 500 MG capsule Take 2 capsules (1,000 mg total) by mouth 2 (two) times daily. (Patient not taking: Reported on 01/13/2023) 40 capsule 0   azelastine (ASTELIN) 0.1 % nasal spray TWO SPRAYS IN EACH NOSTRIL TWICE A DAY IF NEEDED 30 mL PRN   benzonatate (TESSALON) 100 MG capsule Take 1 capsule (100 mg total) by mouth 3 (three) times daily as needed for cough. (Patient not taking: Reported on 01/13/2023) 30 capsule 0   busPIRone (BUSPAR) 15 MG tablet Take 2 tablets (30 mg  total) by mouth daily. 180 tablet 1   Calcium Carb-Cholecalciferol 600-10 MG-MCG TABS Take by mouth.     clonazePAM (KLONOPIN) 0.5 MG tablet TAKE 1 TABLET BY MOUTH EVERY DAY AS NEEDED FOR ANXIETY 30 tablet 1   EPINEPHrine (EPIPEN 2-PAK) 0.3 mg/0.3 mL IJ SOAJ injection Inject 0.3 mg into the muscle as needed for anaphylaxis. 2 each 1   lamoTRIgine (LAMICTAL) 150 MG tablet TAKE 2 TABLETS BY MOUTH EVERY DAY 180 tablet 0   lisinopril (ZESTRIL) 5 MG tablet Take 1 tablet (5 mg total) by mouth daily. 90 tablet 3   loratadine (CLARITIN) 10 MG tablet TAKE 1  TABLET BY MOUTH EVERY DAY 90 tablet 1   Melatonin 3 MG TABS Take 3 mg by mouth at bedtime. Reported on 11/05/2015     montelukast (SINGULAIR) 10 MG tablet Take 1 tablet (10 mg total) by mouth daily. 90 tablet 3   omeprazole (PRILOSEC) 20 MG capsule TAKE 1 CAPSULE BY MOUTH EVERY DAY 90 capsule 1   sertraline (ZOLOFT) 100 MG tablet TAKE 2 TABLETS BY MOUTH EVERY DAY 180 tablet 0   simvastatin (ZOCOR) 40 MG tablet TAKE 1 TABLET BY MOUTH EVERYDAY AT BEDTIME 90 tablet 1   temazepam (RESTORIL) 15 MG capsule Take 1-2 caps po QHS prn (Patient not taking: Reported on 01/13/2023) 30 capsule 0   No current facility-administered medications on file prior to visit.    Review of Systems:  As per HPI- otherwise negative.   Physical Examination: There were no vitals filed for this visit. There were no vitals filed for this visit. There is no height or weight on file to calculate BMI. Ideal Body Weight:    GEN: no acute distress. HEENT: Atraumatic, Normocephalic.  Ears and Nose: No external deformity. CV: RRR, No M/G/R. No JVD. No thrill. No extra heart sounds. PULM: CTA B, no wheezes, crackles, rhonchi. No retractions. No resp. distress. No accessory muscle use. ABD: S, NT, ND, +BS. No rebound. No HSM. EXTR: No c/c/e PSYCH: Normally interactive. Conversant.    Assessment and Plan: *** Physical exam today.  Encouraged healthy diet and exercise  routine Signed Abbe Amsterdam, MD

## 2023-03-30 NOTE — Patient Instructions (Signed)
It was very nice to see you again today, I will be in touch with your lab work

## 2023-04-01 ENCOUNTER — Encounter: Payer: Self-pay | Admitting: Family Medicine

## 2023-04-01 ENCOUNTER — Ambulatory Visit (INDEPENDENT_AMBULATORY_CARE_PROVIDER_SITE_OTHER): Payer: Medicare HMO | Admitting: Family Medicine

## 2023-04-01 VITALS — BP 122/80 | HR 69 | Temp 97.8°F | Resp 18 | Ht 67.0 in | Wt 193.2 lb

## 2023-04-01 DIAGNOSIS — E7800 Pure hypercholesterolemia, unspecified: Secondary | ICD-10-CM | POA: Diagnosis not present

## 2023-04-01 DIAGNOSIS — Z1329 Encounter for screening for other suspected endocrine disorder: Secondary | ICD-10-CM

## 2023-04-01 DIAGNOSIS — M8580 Other specified disorders of bone density and structure, unspecified site: Secondary | ICD-10-CM | POA: Diagnosis not present

## 2023-04-01 DIAGNOSIS — I10 Essential (primary) hypertension: Secondary | ICD-10-CM

## 2023-04-01 DIAGNOSIS — E559 Vitamin D deficiency, unspecified: Secondary | ICD-10-CM | POA: Diagnosis not present

## 2023-04-01 DIAGNOSIS — Z131 Encounter for screening for diabetes mellitus: Secondary | ICD-10-CM | POA: Diagnosis not present

## 2023-04-01 DIAGNOSIS — R748 Abnormal levels of other serum enzymes: Secondary | ICD-10-CM

## 2023-04-01 DIAGNOSIS — Z0000 Encounter for general adult medical examination without abnormal findings: Secondary | ICD-10-CM

## 2023-04-01 DIAGNOSIS — Z130 Encounter for screening for diseases of the blood and blood-forming organs and certain disorders involving the immune mechanism: Secondary | ICD-10-CM | POA: Diagnosis not present

## 2023-04-01 LAB — VITAMIN D 25 HYDROXY (VIT D DEFICIENCY, FRACTURES): VITD: 23.42 ng/mL — ABNORMAL LOW (ref 30.00–100.00)

## 2023-04-01 LAB — COMPREHENSIVE METABOLIC PANEL
ALT: 16 U/L (ref 0–35)
AST: 16 U/L (ref 0–37)
Albumin: 4.3 g/dL (ref 3.5–5.2)
Alkaline Phosphatase: 110 U/L (ref 39–117)
BUN: 18 mg/dL (ref 6–23)
CO2: 26 mEq/L (ref 19–32)
Calcium: 8.9 mg/dL (ref 8.4–10.5)
Chloride: 103 mEq/L (ref 96–112)
Creatinine, Ser: 0.8 mg/dL (ref 0.40–1.20)
GFR: 75.05 mL/min (ref >60.00)
Glucose, Bld: 85 mg/dL (ref 70–99)
Potassium: 5.1 mEq/L (ref 3.5–5.1)
Sodium: 139 mEq/L (ref 135–145)
Total Bilirubin: 0.4 mg/dL (ref 0.2–1.2)
Total Protein: 6.7 g/dL (ref 6.0–8.3)

## 2023-04-01 LAB — LIPID PANEL
Cholesterol: 217 mg/dL — ABNORMAL HIGH (ref 0–200)
HDL: 97.9 mg/dL (ref >39.00)
LDL Cholesterol: 101 mg/dL — ABNORMAL HIGH (ref 0–99)
NonHDL: 119.52
Total CHOL/HDL Ratio: 2
Triglycerides: 95 mg/dL (ref 0.0–149.0)
VLDL: 19 mg/dL (ref 0.0–40.0)

## 2023-04-01 LAB — CBC
HCT: 42.7 % (ref 36.0–46.0)
Hemoglobin: 13.7 g/dL (ref 12.0–15.0)
MCHC: 32.1 g/dL (ref 30.0–36.0)
MCV: 88.6 fl (ref 78.0–100.0)
Platelets: 300 10*3/uL (ref 150.0–400.0)
RBC: 4.82 Mil/uL (ref 3.87–5.11)
RDW: 13.8 % (ref 11.5–15.5)
WBC: 6.3 10*3/uL (ref 4.0–10.5)

## 2023-04-01 LAB — TSH: TSH: 0.99 u[IU]/mL (ref 0.35–5.50)

## 2023-04-01 LAB — HEMOGLOBIN A1C: Hgb A1c MFr Bld: 5.4 % (ref 4.6–6.5)

## 2023-04-10 ENCOUNTER — Other Ambulatory Visit: Payer: Self-pay | Admitting: Psychiatry

## 2023-04-10 DIAGNOSIS — G4700 Insomnia, unspecified: Secondary | ICD-10-CM

## 2023-04-19 ENCOUNTER — Ambulatory Visit (HOSPITAL_BASED_OUTPATIENT_CLINIC_OR_DEPARTMENT_OTHER)
Admission: RE | Admit: 2023-04-19 | Discharge: 2023-04-19 | Disposition: A | Discharge status: Home / Self Care | Payer: Self-pay | Source: Ambulatory Visit | Attending: Family Medicine | Admitting: Family Medicine

## 2023-04-19 DIAGNOSIS — E7800 Pure hypercholesterolemia, unspecified: Secondary | ICD-10-CM

## 2023-04-19 DIAGNOSIS — I10 Essential (primary) hypertension: Secondary | ICD-10-CM

## 2023-04-21 ENCOUNTER — Encounter: Payer: Self-pay | Admitting: Family Medicine

## 2023-04-26 ENCOUNTER — Encounter: Payer: Self-pay | Admitting: Family Medicine

## 2023-04-30 ENCOUNTER — Other Ambulatory Visit: Payer: Self-pay | Admitting: Family Medicine

## 2023-05-11 ENCOUNTER — Other Ambulatory Visit: Payer: Self-pay | Admitting: Family Medicine

## 2023-05-12 ENCOUNTER — Encounter: Payer: Self-pay | Admitting: Psychiatry

## 2023-05-12 ENCOUNTER — Ambulatory Visit: Payer: Medicare HMO | Admitting: Psychiatry

## 2023-05-12 VITALS — Wt 176.0 lb

## 2023-05-12 DIAGNOSIS — G4700 Insomnia, unspecified: Secondary | ICD-10-CM

## 2023-05-12 DIAGNOSIS — F32A Depression, unspecified: Secondary | ICD-10-CM | POA: Diagnosis not present

## 2023-05-12 DIAGNOSIS — F411 Generalized anxiety disorder: Secondary | ICD-10-CM | POA: Diagnosis not present

## 2023-05-12 NOTE — Progress Notes (Signed)
Ashley Craig [id number] 1951-07-01 72 y.o.  Subjective:   Patient ID:  Ashley Craig is a 72 y.o. (DOB 06-20-1951) female.  Chief Complaint:  Chief Complaint  Patient presents with   Follow-up    Depression, Anxiety, and Insomnia    HPI [name] presents to the office today for follow-up of depression, anxiety, and insomnia. She denies anxiety. Denies depressed mood. She reports that she has been busy with volunteer work and projects around [employer]. Energy and motivation have been good. Sleeping well. She reports that her concentration is "off and on, comes and goes." She reports that she is not procrastinating. She has intentionally lost 17 lbs with re-starting [employer] 2 months ago. She notices some improvement in back pain with weight loss. She reports appetite has been ok. Denies SI.   Enjoys her dog. Recently went to [place] to visit family. She is planning to go to the beach with family. Sister is awaiting knee replacement.  She has not needed Temazepam prn recently. She filled script recently in anticipation of upcoming travel. Rarely takes Klonopin prn.   Past Psychiatric Medication Trials: Klonopin- Effective for anxiety. She reports that she has gone weeks without taking it and takes more of it during times of increased stress Lamictal- Has helped stabilize mood. Reports that she has been on higher and lower doses. Has taken at least one year.  Effexor XR- Severe discontinuation s/s.  Prozac Zoloft- Has taken long-term Temazepam- Has used prn when traveling Gabapentin- Difficulty with balance. Took for pain. Buspar  PHQ2-9    Flowsheet Row Office Visit from 04/01/2023 in [facility] Primary Care at [facility] Clinical Support from 11/10/2019 in [facility] Primary Care at [facility]  PHQ-2 Total Score 2 0  PHQ-9 Total Score 2 --      Flowsheet Row ED from 11/07/2022 in [facility] Emergency Department at [facility] ED from 09/28/2021 in [facility] Emergency Department at [facility]  C-SSRS RISK CATEGORY No Risk No Risk        Review of Systems:  Review of Systems  Gastrointestinal: Negative.   Musculoskeletal:  Positive for arthralgias. Negative for gait problem.  Skin:  Negative for rash.  Neurological:  Negative for tremors.  Psychiatric/Behavioral:         Please refer to HPI    Medications: I have reviewed the patient's current medications.  Current Outpatient Medications  Medication Sig Dispense Refill   simvastatin (ZOCOR) 40 MG tablet TAKE 1 TABLET BY MOUTH EVERYDAY AT BEDTIME 90 tablet 1   albuterol (PROVENTIL) (2.5 MG/3ML) 0.083% nebulizer solution TAKE 3 MLS BY NEBULIZATION EVERY 6 (SIX) HOURS AS NEEDED FOR WHEEZING OR SHORTNESS OF BREATH. 75 mL 1   alendronate (FOSAMAX) 70 MG tablet TAKE 1 TABLET (70 MG TOTAL) BY MOUTH EVERY 7 DAYS WITH FULL GLASS WATER ON EMPTY STOMACH 12 tablet 3   azelastine (ASTELIN) 0.1 % nasal spray TWO SPRAYS IN EACH NOSTRIL TWICE A DAY IF NEEDED 30 mL PRN   busPIRone (BUSPAR) 15 MG tablet Take 2 tablets (30 mg total) by mouth daily. 180 tablet 1   Calcium Carb-Cholecalciferol 600-10 MG-MCG TABS Take by mouth.     clonazePAM (KLONOPIN) 0.5 MG tablet TAKE 1 TABLET BY MOUTH EVERY DAY AS NEEDED FOR ANXIETY 30 tablet 1   EPINEPHrine (EPIPEN 2-PAK) 0.3 mg/0.3 mL IJ SOAJ injection Inject 0.3 mg into the muscle as needed for anaphylaxis. 2 each 1  lamoTRIgine (LAMICTAL) 150 MG tablet TAKE 2 TABLETS BY MOUTH EVERY DAY 180 tablet 0   lisinopril (ZESTRIL) 5 MG tablet Take 1 tablet (5 mg total) by mouth daily. 90 tablet 3   loratadine (CLARITIN) 10 MG tablet TAKE 1 TABLET BY MOUTH EVERY DAY 90 tablet 1   Melatonin 3 MG TABS Take 3 mg by mouth at bedtime. Reported on 11/05/2015     montelukast (SINGULAIR) 10 MG tablet Take 1 tablet (10 mg total) by mouth daily. 90 tablet 3   omeprazole (PRILOSEC) 20 MG capsule TAKE 1 CAPSULE BY MOUTH EVERY DAY 90  capsule 1   sertraline (ZOLOFT) 100 MG tablet TAKE 2 TABLETS BY MOUTH EVERY DAY 180 tablet 0   temazepam (RESTORIL) 15 MG capsule TAKE 1 TO 2 CAPSULES BY MOUTH AT BEDTIME AS NEEDED 30 capsule 1   No current facility-administered medications for this visit.    Medication Side Effects: None  Allergies:  Allergies  Allergen Reactions   Other Anaphylaxis    Shellfish-Anaphylaxis   Shellfish Allergy Other (See Comments) and Swelling   Codeine     nausea   Dog Epithelium     Past Medical History:  Diagnosis Date   Anxiety    takes Zoloft daily;takes Clonazepam daily as needed   Arthritis    Chronic back pain    stenosis   Diarrhea    occasionally   GERD (gastroesophageal reflux disease)    takes Omeprazole daily    History of blood clots 10 yrs ago   left calf after a bone break   History of bronchitis 2014   History of colon polyps    benign   Hyperlipidemia    takes Simvastatin daily   Hypertension    Insomnia    takes Melatonin nightly   Joint pain    Seasonal allergies    takes Claritin daily;Nasonex and ProAir as needed;Singulair daily   Weakness    numbness and tingling in right leg    Past Medical History, Surgical history, Social history, and Family history were reviewed and updated as appropriate.   Please see review of systems for further details on the patient's review from today.   Objective:   Physical Exam:  Wt 176 lb (79.8 kg)   BMI 27.57 kg/m   Physical Exam Constitutional:      General: She is not in acute distress. Musculoskeletal:        General: No deformity.  Neurological:     Mental Status: She is alert and oriented to person, place, and time.     Coordination: Coordination normal.  Psychiatric:        Attention and Perception: Attention and perception normal. She does not perceive auditory or visual hallucinations.        Mood and Affect: Mood normal. Mood is not anxious or depressed. Affect is not labile, blunt, angry or  inappropriate.        Speech: Speech normal.        Behavior: Behavior normal.        Thought Content: Thought content normal. Thought content is not paranoid or delusional. Thought content does not include homicidal or suicidal ideation. Thought content does not include homicidal or suicidal plan.        Cognition and Memory: Cognition and memory normal.        Judgment: Judgment normal.     Comments: Insight intact     Lab Review:     Component Value Date/Time   NA  139 04/01/2023 1044   K 5.1 04/01/2023 1044   CL 103 04/01/2023 1044   CO2 26 04/01/2023 1044   GLUCOSE 85 04/01/2023 1044   BUN 18 04/01/2023 1044   CREATININE 0.80 04/01/2023 1044   CALCIUM 8.9 04/01/2023 1044   PROT 6.7 04/01/2023 1044   ALBUMIN 4.3 04/01/2023 1044   AST 16 04/01/2023 1044   ALT 16 04/01/2023 1044   ALKPHOS 110 04/01/2023 1044   BILITOT 0.4 04/01/2023 1044   GFRNONAA >90 10/03/2014 1027   GFRAA >90 10/03/2014 1027       Component Value Date/Time   WBC 6.3 04/01/2023 1044   RBC 4.82 04/01/2023 1044   HGB 13.7 04/01/2023 1044   HCT 42.7 04/01/2023 1044   PLT 300.0 04/01/2023 1044   MCV 88.6 04/01/2023 1044   MCH 29.2 10/03/2014 1027   MCHC 32.1 04/01/2023 1044   RDW 13.8 04/01/2023 1044    No results found for: "POCLITH", "LITHIUM"   No results found for: "PHENYTOIN", "PHENOBARB", "VALPROATE", "CBMZ"   .res Assessment: Plan:    Will continue current plan of care since pt reports that mood and anxiety symptoms have been well controlled.  Continue Sertraline 200 mg daily for anxiety and depression.  Will continue Buspar 30 mg daily for anxiety.  Continue Lamictal 300 mg daily for mood symptoms.  Will continue Klonopin prn anxiety. Pt has a refill on file and reports that she will contact office if she needs a refill prior to next appointment.  Continue Temazepam 15 mg po at bedtime prn insomnia while traveling.  Pt to follow-up in 4 months or sooner if clinically indicated.   Patient advised to contact office with any questions, adverse effects, or acute worsening in signs and symptoms.   Karilynn was seen today for follow-up.  Diagnoses and all orders for this visit:  Generalized anxiety disorder  Depression, unspecified depression type  Insomnia, unspecified type     Please see After Visit Summary for patient specific instructions.  Future Appointments  Date Time Provider Department Center  09/13/2023  1:30 PM Corie Chiquito, PMHNP [facility] None    No orders of the defined types were placed in this encounter.   -------------------------------

## 2023-05-13 ENCOUNTER — Other Ambulatory Visit: Payer: Self-pay | Admitting: Psychiatry

## 2023-05-13 DIAGNOSIS — F411 Generalized anxiety disorder: Secondary | ICD-10-CM

## 2023-05-17 ENCOUNTER — Ambulatory Visit: Payer: Medicare HMO | Admitting: Psychiatry

## 2023-05-18 ENCOUNTER — Other Ambulatory Visit: Payer: Self-pay | Admitting: Family Medicine

## 2023-05-30 ENCOUNTER — Other Ambulatory Visit: Payer: Self-pay | Admitting: Psychiatry

## 2023-05-30 DIAGNOSIS — F32A Depression, unspecified: Secondary | ICD-10-CM

## 2023-05-30 DIAGNOSIS — F411 Generalized anxiety disorder: Secondary | ICD-10-CM

## 2023-06-10 ENCOUNTER — Encounter: Payer: Self-pay | Admitting: Family Medicine

## 2023-07-26 ENCOUNTER — Other Ambulatory Visit: Payer: Self-pay | Admitting: Family Medicine

## 2023-07-26 ENCOUNTER — Encounter: Payer: Self-pay | Admitting: Family Medicine

## 2023-07-26 DIAGNOSIS — Z1231 Encounter for screening mammogram for malignant neoplasm of breast: Secondary | ICD-10-CM

## 2023-07-26 NOTE — Telephone Encounter (Signed)
Immunizations updated- is she due for Prevnar 20?

## 2023-08-22 ENCOUNTER — Other Ambulatory Visit: Payer: Self-pay | Admitting: Psychiatry

## 2023-08-22 ENCOUNTER — Other Ambulatory Visit: Payer: Self-pay | Admitting: Family Medicine

## 2023-08-22 DIAGNOSIS — F411 Generalized anxiety disorder: Secondary | ICD-10-CM

## 2023-08-22 DIAGNOSIS — F32A Depression, unspecified: Secondary | ICD-10-CM

## 2023-08-26 ENCOUNTER — Other Ambulatory Visit: Payer: Self-pay | Admitting: Psychiatry

## 2023-08-26 DIAGNOSIS — F32A Depression, unspecified: Secondary | ICD-10-CM

## 2023-08-26 DIAGNOSIS — F411 Generalized anxiety disorder: Secondary | ICD-10-CM

## 2023-09-13 ENCOUNTER — Telehealth: Payer: Medicare HMO | Admitting: Psychiatry

## 2023-09-15 ENCOUNTER — Encounter: Payer: Self-pay | Admitting: Psychiatry

## 2023-09-18 ENCOUNTER — Other Ambulatory Visit: Payer: Self-pay | Admitting: Family Medicine

## 2023-09-18 ENCOUNTER — Other Ambulatory Visit: Payer: Self-pay | Admitting: Psychiatry

## 2023-09-18 DIAGNOSIS — F32A Depression, unspecified: Secondary | ICD-10-CM

## 2023-09-18 DIAGNOSIS — F411 Generalized anxiety disorder: Secondary | ICD-10-CM

## 2023-09-27 ENCOUNTER — Ambulatory Visit: Payer: Medicare HMO | Admitting: Psychiatry

## 2023-10-28 ENCOUNTER — Encounter: Payer: Self-pay | Admitting: Family Medicine

## 2023-10-29 NOTE — Telephone Encounter (Signed)
Called the pt back and the pt will not be able to come in on 12/30 since she is leaving to go out of town. She has chosen to keep her pending OV.

## 2023-10-29 NOTE — Telephone Encounter (Signed)
Patient is needing to be seen sooner then the appt she has for jan 6th she would like a call back regarding this

## 2023-11-01 ENCOUNTER — Telehealth (INDEPENDENT_AMBULATORY_CARE_PROVIDER_SITE_OTHER): Payer: Medicare HMO | Admitting: Psychiatry

## 2023-11-01 ENCOUNTER — Encounter: Payer: Self-pay | Admitting: Psychiatry

## 2023-11-01 DIAGNOSIS — F32A Depression, unspecified: Secondary | ICD-10-CM

## 2023-11-01 DIAGNOSIS — G4700 Insomnia, unspecified: Secondary | ICD-10-CM

## 2023-11-01 DIAGNOSIS — F411 Generalized anxiety disorder: Secondary | ICD-10-CM

## 2023-11-01 MED ORDER — LAMOTRIGINE 150 MG PO TABS: 300.0000 mg | ORAL_TABLET | Freq: Every day | ORAL | 1 refills | Status: DC

## 2023-11-01 MED ORDER — CLONAZEPAM 0.5 MG PO TABS: ORAL_TABLET | ORAL | 3 refills | Status: DC

## 2023-11-01 MED ORDER — BUSPIRONE HCL 15 MG PO TABS: ORAL_TABLET | ORAL | 1 refills | Status: DC

## 2023-11-01 MED ORDER — TEMAZEPAM 15 MG PO CAPS: ORAL_CAPSULE | ORAL | 1 refills | Status: DC

## 2023-11-01 MED ORDER — SERTRALINE HCL 100 MG PO TABS: 200.0000 mg | ORAL_TABLET | Freq: Every day | ORAL | 1 refills | Status: DC
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
## 2023-11-06 NOTE — Progress Notes (Signed)
 [facility] [address], [address] [place], [place] [address] [phone]  Date:  11/08/2023   Name:  Ashley Craig   DOB:  26-Apr-1951   MRN:  [id number]  PCP:  Watt Harlene BROCKS, MD    Chief Complaint: Possible Pulsatile Tinnitus (Also due for labs)   History of Present Illness:  Ashley Craig is a 73 y.o. very pleasant female patient who presents with the following:  Patient seen today with concern about hearing her pulse in her ear Most recent visit with myself was in May History of allergies, GERD, vertigo, HTN, Fuch's corneal dystrophy, likely benign elevation of alk phos  No implanted device   She contacted me over the Zill[place] Year's holiday and I asked her to come in: Is 123/67 a good blood pressure for me? Checked it several times. I turned 72 in October and have lost 36 pounds. I have started hearing my heart beat in my left ear. I looked it up and read that it isn't heart beat but blood passing through veins. Should I scheduled an appointment? Actually, my right ear.   She has been losing weight intentionally using weight watchers-she is very pleased with her progress  She continues to note the pulsatile tinnitus on the right-she also feels like the ear is blocked.  She does note history of difficult to remove cerumen impaction It is coming and going somewhat- also may wax and wane She has noticed this for about 3-4 weeks Always just on the right No pain She does have sinus congestion issues She notes her vision is gradually getting worse - not sudden change The left ear is normal Her jaw does pop off and on   She notes she is having more intense night sweats recently-she thinks these are menopause symptoms, want to mention it Her weight is running 155- 159 at home  She notes her back pain is much better since she lost weight   Wt Readings from Last 3 Encounters:  11/08/23 165 lb (74.8 kg)  04/01/23 193 lb 3.2  oz (87.6 kg)  11/07/22 190 lb (86.2 kg)    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 risk in both eyes 06/02/2016   Pinguecula of both eyes 06/02/2016   Presbyopia of both eyes 06/02/2016   Regular astigmatism of both eyes 06/02/2016   Varicose veins of left lower extremity with pain 01/16/2016   Mild persistent asthma without complication 11/05/2015   Chronic venous insufficiency 08/07/2015   Spontaneous hematoma of lower leg 11/30/2014   Spinal stenosis of lumbar region 10/11/2014   Adjustment disorder 04/27/2014   Benign essential hypertension 04/27/2014   Benign paroxysmal vertigo 04/27/2014   Chondrocostal junction syndrome 04/27/2014   Elevation of level of transaminase or lactic acid dehydrogenase (LDH) 04/27/2014   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   Overweight 04/27/2014   Spontaneous ecchymosis 04/27/2014   Tendonitis of shoulder 04/27/2014  Anxiety 03/27/2014   Environmental allergies 03/27/2014   Hypercholesteremia 03/27/2014   Insomnia 03/27/2014   Vitamin D  deficiency 03/27/2014    Past Medical History:  Diagnosis Date   Anxiety    takes Zoloft  daily;takes Clonazepam  daily as needed   Arthritis    Chronic back pain    stenosis   Diarrhea    occasionally   GERD (gastroesophageal reflux disease)    takes Omeprazole  daily    History of blood clots 10 yrs ago   left calf after a bone break    History of bronchitis 2014   History of colon polyps    benign   Hyperlipidemia    takes Simvastatin  daily   Hypertension    Insomnia    takes Melatonin nightly   Joint pain    Seasonal allergies    takes Claritin  daily;Nasonex and ProAir  as needed;Singulair  daily   Weakness    numbness and tingling in right leg    Past Surgical History:  Procedure Laterality Date   ANAL FISSURE REPAIR     ANKLE SURGERY Left 09/2017   colonosocpy     D&C of bladder  44yrs ago   ESOPHAGOGASTRODUODENOSCOPY     with ED   TONSILLECTOMY     WRIST SURGERY Right    with plate    Social History   Tobacco Use   Smoking status: Never   Smokeless tobacco: Never  Vaping Use   Vaping status: Never Used  Substance Use Topics   Alcohol use: Yes    Comment: 1-2 glasses of wine daily   Drug use: No    Family History  Problem Relation Age of Onset   Cancer Mother    Retinitis pigmentosa Father    Depression Father    Anxiety disorder Sister    Melanoma Brother    Anxiety disorder Brother    Anorexia nervosa Brother    Leukemia Niece    Anorexia nervosa Cousin    Allergic rhinitis Neg Hx    Angioedema Neg Hx    Asthma Neg Hx    Eczema Neg Hx    Immunodeficiency Neg Hx    Urticaria Neg Hx    Breast cancer Neg Hx     Allergies  Allergen Reactions   Other Anaphylaxis    Shellfish-Anaphylaxis   Shellfish Allergy Other (See Comments) and Swelling   Codeine     nausea   Dog Epithelium     Medication list has been reviewed and updated.  Current Outpatient Medications on File Prior to Visit  Medication Sig Dispense Refill   albuterol  (PROVENTIL ) (2.5 MG/3ML) 0.083% nebulizer solution TAKE 3 MLS BY NEBULIZATION EVERY 6 (SIX) HOURS AS NEEDED FOR WHEEZING OR SHORTNESS OF BREATH. 75 mL 1   alendronate  (FOSAMAX ) 70 MG tablet TAKE 1 TABLET (70 MG TOTAL) BY MOUTH EVERY 7 DAYS WITH FULL GLASS WATER ON EMPTY STOMACH 12 tablet 3   Azelastine  HCl 137 MCG/SPRAY SOLN Place 2 sprays into both  nostrils 2 (two) times daily as needed. 90 mL 1   busPIRone  (BUSPAR ) 15 MG tablet Take 1 tablet twice daily for 3 days, then 1.5 tablets twice daily 270 tablet 1   Calcium Carb-Cholecalciferol 600-10 MG-MCG TABS Take by mouth.     clonazePAM  (KLONOPIN ) 0.5 MG tablet TAKE 1 TABLET BY MOUTH EVERY DAY AS NEEDED FOR ANXIETY 30 tablet 3   Cyanocobalamin (VITAMIN B 12 PO) Take by mouth.     EPINEPHrine  (EPIPEN  2-PAK) 0.3 mg/0.3 mL IJ SOAJ injection  Inject 0.3 mg into the muscle as needed for anaphylaxis. 2 each 1   lamoTRIgine  (LAMICTAL ) 150 MG tablet Take 2 tablets (300 mg total) by mouth daily. 180 tablet 1   lisinopril  (ZESTRIL ) 5 MG tablet Take 1 tablet (5 mg total) by mouth daily. 90 tablet 3   loratadine  (CLARITIN ) 10 MG tablet TAKE 1 TABLET BY MOUTH EVERY DAY 90 tablet 1   Melatonin 3 MG TABS Take 3 mg by mouth at bedtime. Reported on 11/05/2015     montelukast  (SINGULAIR ) 10 MG tablet TAKE 1 TABLET BY MOUTH EVERY DAY 90 tablet 3   Omega-3 Fatty Acids (FISH OIL PO) Take by mouth.     omeprazole  (PRILOSEC) 20 MG capsule TAKE 1 CAPSULE BY MOUTH EVERY DAY 90 capsule 1   sertraline  (ZOLOFT ) 100 MG tablet Take 2 tablets (200 mg total) by mouth daily. 180 tablet 1   simvastatin  (ZOCOR ) 40 MG tablet TAKE 1 TABLET BY MOUTH EVERYDAY AT BEDTIME 90 tablet 1   temazepam  (RESTORIL ) 15 MG capsule TAKE 1 TO 2 CAPSULES BY MOUTH AT BEDTIME AS NEEDED 30 capsule 1   vitamin E 180 MG (400 UNITS) capsule Take 400 Units by mouth daily.     No current facility-administered medications on file prior to visit.    Review of Systems:  As per HPI- otherwise negative.   Physical Examination: Vitals:   11/08/23 1524  BP: 112/72  Pulse: 62  Resp: 18  Temp: 98.2 F (36.8 C)  SpO2: 99%   Vitals:   11/08/23 1524  Weight: 165 lb (74.8 kg)  Height: 5' 7 (1.702 m)   Body mass index is 25.84 kg/m. Ideal Body Weight: Weight in (lb) to have BMI = 25: 159.3  GEN: no acute distress.  Normal weight, looks  well HEENT: Atraumatic, Normocephalic.  Ears and Nose: No external deformity. CV: RRR, No M/G/R. No JVD. No thrill. No extra heart sounds. PULM: CTA B, no wheezes, crackles, rhonchi. No retractions. No resp. distress. No accessory muscle use. EXTR: No c/c/e PSYCH: Normally interactive. Conversant.  Normal facial movement and strength.  Normal strength, sensation, DTR of all extremities. She has a mass of hard cerumen in the right ear canal.  We irrigated and tried to remove with a curette but she was having pain and the wax would not come out.  I asked her to use wax softening drops at home and we made appointment for Wednesday to try again.  It is possible this cerumen impaction is causing the tinnitus she has noted-however, if it does not resolve after removing the wax we will need to pursue further imaging  Assessment and Plan: Impacted cerumen of right ear  Pulsatile tinnitus  Patient seen today with right ear symptoms-she has no pulsatile tinnitus, on exam she has a cerumen impaction which we were not able to resolve today.  She will use wax softening drops at home and then come back on Wednesday.  Will reassess at that time Signed Harlene Schroeder, MD

## 2023-11-08 ENCOUNTER — Ambulatory Visit (INDEPENDENT_AMBULATORY_CARE_PROVIDER_SITE_OTHER): Payer: PPO | Admitting: Family Medicine

## 2023-11-08 VITALS — BP 112/72 | HR 62 | Temp 98.2°F | Resp 18 | Ht 67.0 in | Wt 165.0 lb

## 2023-11-08 DIAGNOSIS — H93A9 Pulsatile tinnitus, unspecified ear: Secondary | ICD-10-CM | POA: Diagnosis not present

## 2023-11-08 DIAGNOSIS — H6121 Impacted cerumen, right ear: Secondary | ICD-10-CM | POA: Diagnosis not present

## 2023-11-08 NOTE — Patient Instructions (Signed)
 Please see me at 11:40 on Wednesday to clean out your ear In the meantime get some wax softening drops like debrox and use them to soften the wax

## 2023-11-09 NOTE — Progress Notes (Signed)
 [facility] [address] [place], [place] [address] [phone]  Date:  11/10/2023   Name:  Ashley Craig   DOB:  31-Dec-1950   MRN:  [id number]  PCP:  Ashley Harlene BROCKS, MD    Chief Complaint: No chief complaint on file.   History of Present Illness:  Ashley Craig is a 73 y.o. very pleasant female patient who presents with the following:  Patient is seen today for recheck of cerumen impaction of the right ear.  I saw her 2 days ago and we were not able to get her earwax out.  I had her use over-the-counter wax remover drops and we will try again today Also, patient notes the pulsatile tinnitus that was bothering her seems to have resolved.  It improved 2 days ago, as of yesterday it has gone away.  She will let me know if it should return  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 risk in both eyes 06/02/2016   Pinguecula of both eyes 06/02/2016   Presbyopia of both eyes 06/02/2016   Regular astigmatism of both eyes 06/02/2016   Varicose veins of left lower extremity with pain 01/16/2016   Mild persistent asthma without complication 11/05/2015   Chronic venous insufficiency 08/07/2015   Spontaneous hematoma of lower leg 11/30/2014   Spinal stenosis of lumbar region 10/11/2014   Adjustment disorder 04/27/2014   Benign essential hypertension  04/27/2014   Benign paroxysmal vertigo 04/27/2014   Chondrocostal junction syndrome 04/27/2014   Elevation of level of transaminase or lactic acid dehydrogenase (LDH) 04/27/2014   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   Overweight 04/27/2014   Spontaneous ecchymosis 04/27/2014   Tendonitis of shoulder 04/27/2014   Anxiety 03/27/2014   Environmental allergies 03/27/2014   Hypercholesteremia 03/27/2014   Insomnia 03/27/2014   Vitamin D  deficiency 03/27/2014    Past Medical History:  Diagnosis Date   Anxiety    takes Zoloft  daily;takes Clonazepam  daily as needed   Arthritis    Chronic back pain    stenosis   Diarrhea    occasionally   GERD (gastroesophageal reflux disease)    takes Omeprazole  daily    History of blood clots 10 yrs ago   left calf after a bone break   History of bronchitis 2014   History of colon polyps    benign   Hyperlipidemia    takes Simvastatin  daily   Hypertension    Insomnia    takes Melatonin nightly   Joint pain    Seasonal allergies    takes Claritin  daily;Nasonex and ProAir  as needed;Singulair  daily   Weakness    numbness and tingling in right leg    Past Surgical History:  Procedure Laterality Date   ANAL FISSURE REPAIR     ANKLE SURGERY Left 09/2017   colonosocpy     D&C of bladder  31yrs ago   ESOPHAGOGASTRODUODENOSCOPY     with  ED   TONSILLECTOMY     WRIST SURGERY Right    with plate    Social History   Tobacco Use   Smoking status: Never   Smokeless tobacco: Never  Vaping Use   Vaping status: Never Used  Substance Use Topics   Alcohol use: Yes    Comment: 1-2 glasses of wine daily   Drug use: No    Family History  Problem Relation Age of Onset   Cancer Mother    Retinitis pigmentosa Father    Depression Father    Anxiety disorder Sister    Melanoma Brother    Anxiety disorder Brother    Anorexia nervosa Brother    Leukemia Niece    Anorexia nervosa Cousin    Allergic rhinitis Neg Hx    Angioedema Neg Hx     Asthma Neg Hx    Eczema Neg Hx    Immunodeficiency Neg Hx    Urticaria Neg Hx    Breast cancer Neg Hx     Allergies  Allergen Reactions   Other Anaphylaxis    Shellfish-Anaphylaxis   Shellfish Allergy Other (See Comments) and Swelling   Codeine     nausea   Dog Epithelium     Medication list has been reviewed and updated.  Current Outpatient Medications on File Prior to Visit  Medication Sig Dispense Refill   albuterol  (PROVENTIL ) (2.5 MG/3ML) 0.083% nebulizer solution TAKE 3 MLS BY NEBULIZATION EVERY 6 (SIX) HOURS AS NEEDED FOR WHEEZING OR SHORTNESS OF BREATH. 75 mL 1   alendronate  (FOSAMAX ) 70 MG tablet TAKE 1 TABLET (70 MG TOTAL) BY MOUTH EVERY 7 DAYS WITH FULL GLASS WATER ON EMPTY STOMACH 12 tablet 3   Azelastine  HCl 137 MCG/SPRAY SOLN Place 2 sprays into both nostrils 2 (two) times daily as needed. 90 mL 1   busPIRone  (BUSPAR ) 15 MG tablet Take 1 tablet twice daily for 3 days, then 1.5 tablets twice daily 270 tablet 1   Calcium Carb-Cholecalciferol 600-10 MG-MCG TABS Take by mouth.     clonazePAM  (KLONOPIN ) 0.5 MG tablet TAKE 1 TABLET BY MOUTH EVERY DAY AS NEEDED FOR ANXIETY 30 tablet 3   Cyanocobalamin (VITAMIN B 12 PO) Take by mouth.     EPINEPHrine  (EPIPEN  2-PAK) 0.3 mg/0.3 mL IJ SOAJ injection Inject 0.3 mg into the muscle as needed for anaphylaxis. 2 each 1   lamoTRIgine  (LAMICTAL ) 150 MG tablet Take 2 tablets (300 mg total) by mouth daily. 180 tablet 1   lisinopril  (ZESTRIL ) 5 MG tablet Take 1 tablet (5 mg total) by mouth daily. 90 tablet 3   loratadine  (CLARITIN ) 10 MG tablet TAKE 1 TABLET BY MOUTH EVERY DAY 90 tablet 1   Melatonin 3 MG TABS Take 3 mg by mouth at bedtime. Reported on 11/05/2015     montelukast  (SINGULAIR ) 10 MG tablet TAKE 1 TABLET BY MOUTH EVERY DAY 90 tablet 3   Omega-3 Fatty Acids (FISH OIL PO) Take by mouth.     omeprazole  (PRILOSEC) 20 MG capsule TAKE 1 CAPSULE BY MOUTH EVERY DAY 90 capsule 1   sertraline  (ZOLOFT ) 100 MG tablet Take 2 tablets (200  mg total) by mouth daily. 180 tablet 1   simvastatin  (ZOCOR ) 40 MG tablet TAKE 1 TABLET BY MOUTH EVERYDAY AT BEDTIME 90 tablet 1   temazepam  (RESTORIL ) 15 MG capsule TAKE 1 TO 2 CAPSULES BY MOUTH AT BEDTIME AS NEEDED 30 capsule 1   vitamin E 180 MG (400 UNITS) capsule Take 400 Units by mouth daily.  No current facility-administered medications on file prior to visit.    Review of Systems:  As per HPI- otherwise negative.   Physical Examination: There were no vitals filed for this visit. There were no vitals filed for this visit. There is no height or weight on file to calculate BMI. Ideal Body Weight:    GEN: No acute distress; alert,appropriate. PULM: Breathing comfortably in no respiratory distress PSYCH: Normally interactive.  Patient has been using wax softening drops in her right ear.  We used warm water and peroxide I was able to easily remove impacted cerumen with irrigation.  Patient tolerated well with no immediate complications, we observe a clear ear canal and normal TM after procedure  Assessment and Plan: Impacted cerumen of right ear Resolved cerumen impaction as above.  Tinnitus is also resolved.  Patient will let me know if tinnitus should return  Signed Harlene Schroeder, MD

## 2023-11-10 ENCOUNTER — Ambulatory Visit (INDEPENDENT_AMBULATORY_CARE_PROVIDER_SITE_OTHER): Payer: PPO | Admitting: Family Medicine

## 2023-11-10 DIAGNOSIS — H6121 Impacted cerumen, right ear: Secondary | ICD-10-CM

## 2023-11-19 DIAGNOSIS — M1712 Unilateral primary osteoarthritis, left knee: Secondary | ICD-10-CM | POA: Diagnosis not present

## 2023-12-09 ENCOUNTER — Encounter: Payer: Self-pay | Admitting: Family Medicine

## 2023-12-10 MED ORDER — EPINEPHRINE 0.3 MG/0.3ML IJ SOAJ: 0.3000 mg | INTRAMUSCULAR | 1 refills | Status: AC | PRN

## 2023-12-13 ENCOUNTER — Ambulatory Visit
Admission: RE | Admit: 2023-12-13 | Discharge: 2023-12-13 | Disposition: A | Discharge status: Home / Self Care | Payer: PPO | Source: Ambulatory Visit | Attending: Family Medicine | Admitting: Family Medicine

## 2023-12-13 DIAGNOSIS — Z1231 Encounter for screening mammogram for malignant neoplasm of breast: Secondary | ICD-10-CM

## 2023-12-27 ENCOUNTER — Encounter: Payer: Self-pay | Admitting: Family Medicine

## 2023-12-27 MED ORDER — SIMVASTATIN 40 MG PO TABS: 40.0000 mg | ORAL_TABLET | Freq: Every day | ORAL | 0 refills | Status: DC

## 2024-01-09 ENCOUNTER — Encounter: Payer: Self-pay | Admitting: Family Medicine

## 2024-01-09 DIAGNOSIS — J4530 Mild persistent asthma, uncomplicated: Secondary | ICD-10-CM

## 2024-01-10 MED ORDER — ALBUTEROL SULFATE HFA 108 (90 BASE) MCG/ACT IN AERS: 2.0000 | INHALATION_SPRAY | Freq: Four times a day (QID) | RESPIRATORY_TRACT | 2 refills | Status: AC | PRN

## 2024-01-10 NOTE — Telephone Encounter (Signed)
 I see neb solution, okay for inhaler?

## 2024-01-14 DIAGNOSIS — M48062 Spinal stenosis, lumbar region with neurogenic claudication: Secondary | ICD-10-CM | POA: Diagnosis not present

## 2024-01-14 DIAGNOSIS — M4326 Fusion of spine, lumbar region: Secondary | ICD-10-CM | POA: Diagnosis not present

## 2024-01-14 DIAGNOSIS — M4726 Other spondylosis with radiculopathy, lumbar region: Secondary | ICD-10-CM | POA: Diagnosis not present

## 2024-01-21 DIAGNOSIS — M461 Sacroiliitis, not elsewhere classified: Secondary | ICD-10-CM | POA: Diagnosis not present

## 2024-01-24 ENCOUNTER — Encounter: Payer: Self-pay | Admitting: Family Medicine

## 2024-01-24 DIAGNOSIS — M8580 Other specified disorders of bone density and structure, unspecified site: Secondary | ICD-10-CM

## 2024-01-24 MED ORDER — ALENDRONATE SODIUM 70 MG PO TABS: 70.0000 mg | ORAL_TABLET | ORAL | 3 refills | Status: DC

## 2024-01-28 ENCOUNTER — Encounter: Payer: Self-pay | Admitting: Physician Assistant

## 2024-01-28 ENCOUNTER — Ambulatory Visit (INDEPENDENT_AMBULATORY_CARE_PROVIDER_SITE_OTHER): Payer: Medicare HMO | Admitting: Physician Assistant

## 2024-01-28 DIAGNOSIS — F411 Generalized anxiety disorder: Secondary | ICD-10-CM

## 2024-01-28 DIAGNOSIS — G4700 Insomnia, unspecified: Secondary | ICD-10-CM

## 2024-01-28 DIAGNOSIS — F32A Depression, unspecified: Secondary | ICD-10-CM

## 2024-01-28 MED ORDER — TEMAZEPAM 15 MG PO CAPS: ORAL_CAPSULE | ORAL | 2 refills | Status: DC

## 2024-01-28 MED ORDER — CLONAZEPAM 0.5 MG PO TABS: 0.5000 mg | ORAL_TABLET | Freq: Every day | ORAL | 2 refills | Status: DC | PRN

## 2024-01-28 NOTE — Progress Notes (Signed)
 [facility]  Patient ID: [facility],  MRN: [id number]  PCP: Ashley Cables, MD  Date of Evaluation: 01/28/2024 Time spent:25 minutes  Chief Complaint:  Chief Complaint   Anxiety    HISTORY/CURRENT STATUS: HPI Transferring to my care from Ashley Chiquito, NP who is no longer with the practice.   Has anxiety, feels like her meds are still effective.  She does not have panic attacks but feels anxious in general sometimes.  Takes Klonopin occasionally and it works as intended.    Patient is able to enjoy things.  "I am a people person and like doing things with friends."  Energy and motivation are good.   No extreme sadness, tearfulness, or feelings of hopelessness.  Sleeps well most of the time, but does need the temazepam sometimes. ADLs and personal hygiene are normal.   Denies any changes in concentration, making decisions, or remembering things.  Appetite has not changed.  Weight is stable.  Denies suicidal or homicidal thoughts.   Patient denies increased energy with decreased need for sleep, increased talkativeness, racing thoughts, impulsivity or risky behaviors, increased spending, increased libido, grandiosity, increased irritability or anger, paranoia, or hallucinations.  Denies dizziness, syncope, seizures, numbness, tingling, tremor, tics, unsteady gait, slurred speech, confusion. Denies muscle or joint pain, stiffness, or dystonia.  Individual Medical History/ Review of Systems: Changes? :No   Past Psychiatric Medication Trials: Klonopin- Effective for anxiety. She reports that she has gone weeks without taking it and takes more of it during times of increased stress Lamictal- Has helped stabilize mood. Reports that she has been on higher and lower doses. Has taken at least one year.  Effexor XR- Severe discontinuation s/s.  Prozac Zoloft- Has taken long-term Temazepam- Has used prn when traveling Gabapentin- Difficulty with balance. Took for  pain. Buspar Allergies: Other, Shellfish allergy, Codeine, and Dog epithelium  Current Medications:  Current Outpatient Medications:    albuterol (PROVENTIL) (2.5 MG/3ML) 0.083% nebulizer solution, TAKE 3 MLS BY NEBULIZATION EVERY 6 (SIX) HOURS AS NEEDED FOR WHEEZING OR SHORTNESS OF BREATH., Disp: 75 mL, Rfl: 1   albuterol (VENTOLIN HFA) 108 (90 Base) MCG/ACT inhaler, Inhale 2 puffs into the lungs every 6 (six) hours as needed for wheezing or shortness of breath., Disp: 8 g, Rfl: 2   alendronate (FOSAMAX) 70 MG tablet, Take 1 tablet (70 mg total) by mouth once a week. Take with a full glass of water on an empty stomach., Disp: 12 tablet, Rfl: 3   Azelastine HCl 137 MCG/SPRAY SOLN, Place 2 sprays into both nostrils 2 (two) times daily as needed., Disp: 90 mL, Rfl: 1   Calcium Carb-Cholecalciferol 600-10 MG-MCG TABS, Take by mouth., Disp: , Rfl:    Cyanocobalamin (VITAMIN B 12 PO), Take by mouth., Disp: , Rfl:    lamoTRIgine (LAMICTAL) 150 MG tablet, Take 2 tablets (300 mg total) by mouth daily., Disp: 180 tablet, Rfl: 1   lisinopril (ZESTRIL) 5 MG tablet, Take 1 tablet (5 mg total) by mouth daily., Disp: 90 tablet, Rfl: 3   loratadine (CLARITIN) 10 MG tablet, TAKE 1 TABLET BY MOUTH EVERY DAY, Disp: 90 tablet, Rfl: 1   Melatonin 3 MG TABS, Take 3 mg by mouth at bedtime. Reported on 11/05/2015, Disp: , Rfl:    montelukast (SINGULAIR) 10 MG tablet, TAKE 1 TABLET BY MOUTH EVERY DAY, Disp: 90 tablet, Rfl: 3   Omega-3 Fatty Acids (FISH OIL PO), Take by mouth., Disp: , Rfl:    omeprazole (PRILOSEC) 20 MG capsule,  TAKE 1 CAPSULE BY MOUTH EVERY DAY, Disp: 90 capsule, Rfl: 1   sertraline (ZOLOFT) 100 MG tablet, Take 2 tablets (200 mg total) by mouth daily., Disp: 180 tablet, Rfl: 1   simvastatin (ZOCOR) 40 MG tablet, Take 1 tablet (40 mg total) by mouth daily at 6 PM., Disp: 90 tablet, Rfl: 0   vitamin E 180 MG (400 UNITS) capsule, Take 400 Units by mouth daily., Disp: , Rfl:    busPIRone (BUSPAR) 15 MG  tablet, Take 1.5 tablets (22.5 mg total) by mouth 2 (two) times daily., Disp: , Rfl:    clonazePAM (KLONOPIN) 0.5 MG tablet, Take 1 tablet (0.5 mg total) by mouth daily as needed for anxiety. TAKE 1 TABLET BY MOUTH EVERY DAY AS NEEDED FOR ANXIETY, Disp: 30 tablet, Rfl: 2   EPINEPHrine (EPIPEN 2-PAK) 0.3 mg/0.3 mL IJ SOAJ injection, Inject 0.3 mg into the muscle as needed for anaphylaxis. (Patient not taking: Reported on 01/28/2024), Disp: 2 each, Rfl: 1   temazepam (RESTORIL) 15 MG capsule, TAKE 1 TO 2 CAPSULES BY MOUTH AT BEDTIME AS NEEDED, Disp: 30 capsule, Rfl: 2 Medication Side Effects: none  Family Medical/ Social History: Changes? No  MENTAL HEALTH EXAM:  There were no vitals taken for this visit.There is no height or weight on file to calculate BMI.  General Appearance: Casual and Well Groomed  Eye Contact:  Good  Speech:  Clear and Coherent and Normal Rate  Volume:  Normal  Mood:  Euthymic  Affect:  Congruent  Thought Process:  Goal Directed and Descriptions of Associations: Circumstantial  Orientation:  Full (Time, Place, and Person)  Thought Content: Logical   Suicidal Thoughts:  No  Homicidal Thoughts:  No  Memory:  WNL  Judgement:  Good  Insight:  Good  Psychomotor Activity:  Normal  Concentration:  Concentration: Good  Recall:  Good  Fund of Knowledge: Good  Language: Good  Assets:  Communication Skills Desire for Improvement Financial Resources/Insurance Housing Transportation  ADL's:  Intact  Cognition: WNL  Prognosis:  Good   DIAGNOSES:    ICD-10-CM   1. Generalized anxiety disorder  F41.1 clonazePAM (KLONOPIN) 0.5 MG tablet    busPIRone (BUSPAR) 15 MG tablet    2. Insomnia, unspecified type  G47.00 temazepam (RESTORIL) 15 MG capsule    3. Depression, unspecified depression type  F32.A      Receiving Psychotherapy: No   RECOMMENDATIONS:   PDMP reviewed.  Klonopin filled 11/01/2023. I provided  25 minutes of face to face time during this encounter,  including time spent before and after the visit in records review, medical decision making, counseling pertinent to today's visit, and charting.   [facility] is doing well on her current medications so no changes need to be made.  Continue BuSpar 15 mg, 1.5 pills p.o. twice daily. Continue Klonopin 0.5 mg, 1 p.o. daily as needed. Continue Lamictal 150 mg, 2 p.o. daily. Continue Zoloft 100 mg, 2 p.o. daily. Continue temazepam 15 mg, 1-2 p.o. nightly as needed sleep. Return in 3 months.  Melony Overly, PA-C

## 2024-01-31 MED ORDER — BUSPIRONE HCL 15 MG PO TABS: 22.5000 mg | ORAL_TABLET | Freq: Two times a day (BID) | ORAL | Status: DC

## 2024-02-09 ENCOUNTER — Encounter: Payer: Self-pay | Admitting: Family Medicine

## 2024-02-09 MED ORDER — MONTELUKAST SODIUM 10 MG PO TABS: 10.0000 mg | ORAL_TABLET | Freq: Every day | ORAL | 1 refills | Status: DC

## 2024-02-16 DIAGNOSIS — H52203 Unspecified astigmatism, bilateral: Secondary | ICD-10-CM | POA: Diagnosis not present

## 2024-02-16 DIAGNOSIS — H2513 Age-related nuclear cataract, bilateral: Secondary | ICD-10-CM | POA: Diagnosis not present

## 2024-02-16 DIAGNOSIS — H5203 Hypermetropia, bilateral: Secondary | ICD-10-CM | POA: Diagnosis not present

## 2024-02-16 DIAGNOSIS — H40013 Open angle with borderline findings, low risk, bilateral: Secondary | ICD-10-CM | POA: Diagnosis not present

## 2024-02-16 DIAGNOSIS — H43813 Vitreous degeneration, bilateral: Secondary | ICD-10-CM | POA: Diagnosis not present

## 2024-02-16 DIAGNOSIS — H524 Presbyopia: Secondary | ICD-10-CM | POA: Diagnosis not present

## 2024-02-23 DIAGNOSIS — M4326 Fusion of spine, lumbar region: Secondary | ICD-10-CM | POA: Diagnosis not present

## 2024-03-28 ENCOUNTER — Other Ambulatory Visit: Payer: Self-pay | Admitting: Family Medicine

## 2024-04-02 ENCOUNTER — Encounter: Payer: Self-pay | Admitting: Family Medicine

## 2024-04-03 ENCOUNTER — Ambulatory Visit (INDEPENDENT_AMBULATORY_CARE_PROVIDER_SITE_OTHER): Admitting: Physician Assistant

## 2024-04-03 ENCOUNTER — Encounter: Payer: Self-pay | Admitting: Physician Assistant

## 2024-04-03 VITALS — BP 129/74 | HR 74 | Temp 98.0°F | Ht 67.0 in | Wt 165.4 lb

## 2024-04-03 DIAGNOSIS — R0981 Nasal congestion: Secondary | ICD-10-CM

## 2024-04-03 DIAGNOSIS — J069 Acute upper respiratory infection, unspecified: Secondary | ICD-10-CM | POA: Diagnosis not present

## 2024-04-03 LAB — POC COVID19 BINAXNOW: SARS Coronavirus 2 Ag: NEGATIVE

## 2024-04-03 NOTE — Progress Notes (Signed)
 Established patient visit   Patient: [facility]   DOB: Jul 29, 1951   73 y.o. Female  MRN: [id number] Visit Date: 04/03/2024  Today's healthcare provider: Trenton Frock, PA-C  Cc. Cough, congestion  Subjective     Pt reports x 2 days of a productive cough w/ yellow sputum, ear pain, sore throat, nasal congestion, gland swelling. Reports used her albuterol  inhaler yesterday. Otherwise taking claritin , Singulair , nasal sprays, tylenol  otc.   Medications: Outpatient Medications Prior to Visit  Medication Sig   albuterol  (PROVENTIL ) (2.5 MG/3ML) 0.083% nebulizer solution TAKE 3 MLS BY NEBULIZATION EVERY 6 (SIX) HOURS AS NEEDED FOR WHEEZING OR SHORTNESS OF BREATH.   albuterol  (VENTOLIN  HFA) 108 (90 Base) MCG/ACT inhaler Inhale 2 puffs into the lungs every 6 (six) hours as needed for wheezing or shortness of breath.   alendronate  (FOSAMAX ) 70 MG tablet Take 1 tablet (70 mg total) by mouth once a week. Take with a full glass of water on an empty stomach.   Azelastine  HCl 137 MCG/SPRAY SOLN Place 2 sprays into both nostrils 2 (two) times daily as needed.   busPIRone  (BUSPAR ) 15 MG tablet Take 1.5 tablets (22.5 mg total) by mouth 2 (two) times daily.   Calcium Carb-Cholecalciferol 600-10 MG-MCG TABS Take by mouth.   clonazePAM  (KLONOPIN ) 0.5 MG tablet Take 1 tablet (0.5 mg total) by mouth daily as needed for anxiety. TAKE 1 TABLET BY MOUTH EVERY DAY AS NEEDED FOR ANXIETY   Cyanocobalamin (VITAMIN B 12 PO) Take by mouth.   EPINEPHrine  (EPIPEN  2-PAK) 0.3 mg/0.3 mL IJ SOAJ injection Inject 0.3 mg into the muscle as needed for anaphylaxis. (Patient not taking: Reported on 01/28/2024)   lamoTRIgine  (LAMICTAL ) 150 MG tablet Take 2 tablets (300 mg total) by mouth daily.   lisinopril  (ZESTRIL ) 5 MG tablet Take 1 tablet (5 mg total) by mouth daily.   loratadine  (CLARITIN ) 10 MG tablet TAKE 1 TABLET BY MOUTH EVERY DAY   Melatonin 3 MG TABS Take 3 mg by mouth at bedtime. Reported on 11/05/2015    montelukast  (SINGULAIR ) 10 MG tablet Take 1 tablet (10 mg total) by mouth daily.   Omega-3 Fatty Acids (FISH OIL PO) Take by mouth.   omeprazole  (PRILOSEC) 20 MG capsule TAKE 1 CAPSULE BY MOUTH EVERY DAY   sertraline  (ZOLOFT ) 100 MG tablet Take 2 tablets (200 mg total) by mouth daily.   simvastatin  (ZOCOR ) 40 MG tablet Take 1 tablet (40 mg total) by mouth daily at 6 PM.   temazepam  (RESTORIL ) 15 MG capsule TAKE 1 TO 2 CAPSULES BY MOUTH AT BEDTIME AS NEEDED   vitamin E 180 MG (400 UNITS) capsule Take 400 Units by mouth daily.   No facility-administered medications prior to visit.    Review of Systems  Constitutional:  Negative for fatigue and fever.  HENT:  Positive for congestion, ear pain, sinus pressure, sinus pain and sore throat.   Respiratory:  Positive for cough. Negative for shortness of breath.   Cardiovascular:  Negative for chest pain and leg swelling.  Gastrointestinal:  Negative for abdominal pain.  Neurological:  Negative for dizziness and headaches.       Objective    BP 129/74   Pulse 74   Temp 98 F (36.7 C)   Ht 5\' 7"  (1.702 m)   Wt 165 lb 6.4 oz (75 kg)   SpO2 97%   BMI 25.91 kg/m    Physical Exam Constitutional:      General: She is awake.  Appearance: She is well-developed.  HENT:     Head: Normocephalic.     Ears:     Comments: Serous fluid behind b/l TM with mild bulging    Mouth/Throat:     Pharynx: Posterior oropharyngeal erythema present. No oropharyngeal exudate.  Eyes:     Conjunctiva/sclera: Conjunctivae normal.  Cardiovascular:     Rate and Rhythm: Normal rate and regular rhythm.     Heart sounds: Normal heart sounds.  Pulmonary:     Effort: Pulmonary effort is normal.     Breath sounds: Normal breath sounds.  Skin:    General: Skin is warm.  Neurological:     Mental Status: She is alert and oriented to person, place, and time.  Psychiatric:        Attention and Perception: Attention normal.        Mood and Affect: Mood normal.         Speech: Speech normal.        Behavior: Behavior is cooperative.      Results for orders placed or performed in visit on 04/03/24  POC COVID-19 BinaxNow  Result Value Ref Range   SARS Coronavirus 2 Ag Negative Negative    Assessment & Plan    Upper respiratory tract infection, unspecified type  Nasal congestion -     POC COVID-19 BinaxNow  Cont prescribed and otc meds, can take sudafed for severe nasal congestion, recommending saline nasal rinse.  Poc covid negative   Return if symptoms worsen or fail to improve.      Trenton Frock, PA-C  [facility] [phone] (phone) [phone] (fax)  [facility]

## 2024-04-03 NOTE — Patient Instructions (Signed)
 [email or web address]

## 2024-04-03 NOTE — Telephone Encounter (Signed)
Pt is aware and expressed understanding 

## 2024-04-04 ENCOUNTER — Encounter: Payer: Self-pay | Admitting: Physician Assistant

## 2024-04-05 ENCOUNTER — Encounter: Payer: Self-pay | Admitting: Family Medicine

## 2024-04-05 ENCOUNTER — Ambulatory Visit: Admitting: Family Medicine

## 2024-04-05 MED ORDER — ALBUTEROL SULFATE (2.5 MG/3ML) 0.083% IN NEBU: 2.5000 mg | INHALATION_SOLUTION | Freq: Four times a day (QID) | RESPIRATORY_TRACT | 1 refills | Status: AC | PRN

## 2024-04-05 NOTE — Telephone Encounter (Signed)
 Duplicate message.

## 2024-04-08 ENCOUNTER — Encounter (INDEPENDENT_AMBULATORY_CARE_PROVIDER_SITE_OTHER): Payer: Self-pay | Admitting: Family Medicine

## 2024-04-08 DIAGNOSIS — J0110 Acute frontal sinusitis, unspecified: Secondary | ICD-10-CM

## 2024-04-08 MED ORDER — AMOXICILLIN 875 MG PO TABS: 875.0000 mg | ORAL_TABLET | Freq: Two times a day (BID) | ORAL | 0 refills | Status: DC

## 2024-04-08 NOTE — Telephone Encounter (Signed)

## 2024-04-13 DIAGNOSIS — M963 Postlaminectomy kyphosis: Secondary | ICD-10-CM | POA: Diagnosis not present

## 2024-04-13 DIAGNOSIS — M1611 Unilateral primary osteoarthritis, right hip: Secondary | ICD-10-CM | POA: Diagnosis not present

## 2024-04-13 DIAGNOSIS — Z1330 Encounter for screening examination for mental health and behavioral disorders, unspecified: Secondary | ICD-10-CM | POA: Diagnosis not present

## 2024-04-26 ENCOUNTER — Other Ambulatory Visit: Payer: Self-pay | Admitting: Family Medicine

## 2024-04-28 ENCOUNTER — Other Ambulatory Visit: Payer: Self-pay | Admitting: Family Medicine

## 2024-05-01 ENCOUNTER — Telehealth: Payer: Self-pay | Admitting: *Deleted

## 2024-05-01 ENCOUNTER — Encounter: Payer: Self-pay | Admitting: Physician Assistant

## 2024-05-01 ENCOUNTER — Ambulatory Visit (INDEPENDENT_AMBULATORY_CARE_PROVIDER_SITE_OTHER): Admitting: *Deleted

## 2024-05-01 ENCOUNTER — Ambulatory Visit (INDEPENDENT_AMBULATORY_CARE_PROVIDER_SITE_OTHER): Admitting: Physician Assistant

## 2024-05-01 VITALS — BP 133/65 | HR 66 | Temp 98.3°F | Resp 14 | Ht 67.0 in | Wt 169.4 lb

## 2024-05-01 DIAGNOSIS — G4700 Insomnia, unspecified: Secondary | ICD-10-CM

## 2024-05-01 DIAGNOSIS — F32A Depression, unspecified: Secondary | ICD-10-CM | POA: Diagnosis not present

## 2024-05-01 DIAGNOSIS — F411 Generalized anxiety disorder: Secondary | ICD-10-CM

## 2024-05-01 DIAGNOSIS — Z0000 Encounter for general adult medical examination without abnormal findings: Secondary | ICD-10-CM | POA: Diagnosis not present

## 2024-05-01 DIAGNOSIS — H269 Unspecified cataract: Secondary | ICD-10-CM

## 2024-05-01 HISTORY — DX: Unspecified cataract: H26.9

## 2024-05-01 MED ORDER — TEMAZEPAM 15 MG PO CAPS: ORAL_CAPSULE | ORAL | 5 refills | Status: AC

## 2024-05-01 MED ORDER — CLONAZEPAM 0.5 MG PO TABS
0.5000 mg | ORAL_TABLET | Freq: Every day | ORAL | 5 refills | Status: DC | PRN
Start: 2024-05-01 — End: 2024-09-14

## 2024-05-01 MED ORDER — LAMOTRIGINE 150 MG PO TABS: 300.0000 mg | ORAL_TABLET | Freq: Every day | ORAL | 1 refills | Status: DC

## 2024-05-01 NOTE — Progress Notes (Signed)
 Subjective:   Ashley Craig is a 73 y.o. who presents for a Medicare Wellness preventive visit.  As a reminder, Annual Wellness Visits don't include a physical exam, and some assessments may be limited, especially if this visit is performed virtually. We may recommend an in-person follow-up visit with your provider if needed.  Visit Complete: In person   Persons Participating in Visit: Patient.  AWV Questionnaire: Yes: Patient Medicare AWV questionnaire was completed by the patient on 04/27/24; I have confirmed that all information answered by patient is correct and no changes since this date.  Cardiac Risk Factors include: advanced age (>73men, >84 women);dyslipidemia;hypertension     Objective:    Today's Vitals   05/01/24 0858 05/01/24 0906  BP: (!) 145/64 133/65  Pulse: 66   Resp: 14   Temp: 98.3 F (36.8 C)   TempSrc: Oral   SpO2: 100%   Weight: 169 lb 6.4 oz (76.8 kg)   Height: 5' 7 (1.702 m)   PainSc:  8    Body mass index is 26.53 kg/m.     05/01/2024    9:28 AM 11/07/2022    5:30 PM 09/28/2021    3:55 PM 11/10/2019   11:10 AM 10/31/2014    3:09 PM 10/27/2014   11:09 AM 10/03/2014   10:08 AM  Advanced Directives  Does Patient Have a Medical Advance Directive? Yes No No Yes No  No  Yes   Type of Estate agent of [place];Living will   Healthcare Power of [place];Living will   Healthcare Power of [place];Living will   Does patient want to make changes to medical advance directive? No - Patient declined   No - Patient declined     Copy of Healthcare Power of Attorney in Chart? No - copy requested   No - copy requested   Yes   Would patient like information on creating a medical advance directive?     No - patient declined information  No - patient declined information       Data saved with a previous flowsheet row definition    Current Medications (verified) Outpatient Encounter Medications as of 05/01/2024  Medication Sig   albuterol   (PROVENTIL ) (2.5 MG/3ML) 0.083% nebulizer solution Take 3 mLs (2.5 mg total) by nebulization every 6 (six) hours as needed for wheezing or shortness of breath.   albuterol  (VENTOLIN  HFA) 108 (90 Base) MCG/ACT inhaler Inhale 2 puffs into the lungs every 6 (six) hours as needed for wheezing or shortness of breath.   alendronate  (FOSAMAX ) 70 MG tablet Take 1 tablet (70 mg total) by mouth once a week. Take with a full glass of water on an empty stomach.   Azelastine  HCl 137 MCG/SPRAY SOLN Place 2 sprays into both nostrils 2 (two) times daily as needed.   busPIRone  (BUSPAR ) 15 MG tablet Take 1.5 tablets (22.5 mg total) by mouth 2 (two) times daily.   Calcium Carb-Cholecalciferol 600-10 MG-MCG TABS Take by mouth. (Patient taking differently: Take 1 tablet by mouth in the morning and at bedtime.)   clonazePAM  (KLONOPIN ) 0.5 MG tablet Take 1 tablet (0.5 mg total) by mouth daily as needed for anxiety. TAKE 1 TABLET BY MOUTH EVERY DAY AS NEEDED FOR ANXIETY   Cyanocobalamin (VITAMIN B 12 PO) Take by mouth. (Patient taking differently: Take 1 tablet by mouth in the morning and at bedtime.)   EPINEPHrine  (EPIPEN  2-PAK) 0.3 mg/0.3 mL IJ SOAJ injection Inject 0.3 mg into the muscle as needed for anaphylaxis.  lamoTRIgine  (LAMICTAL ) 150 MG tablet Take 2 tablets (300 mg total) by mouth daily.   lisinopril  (ZESTRIL ) 5 MG tablet Take 1 tablet (5 mg total) by mouth daily.   loratadine  (CLARITIN ) 10 MG tablet TAKE 1 TABLET BY MOUTH EVERY DAY   Melatonin 3 MG TABS Take 3 mg by mouth at bedtime. Reported on 11/05/2015   montelukast  (SINGULAIR ) 10 MG tablet Take 1 tablet (10 mg total) by mouth daily.   Omega-3 Fatty Acids (FISH OIL PO) Take by mouth. (Patient taking differently: Take 2 capsules by mouth daily.)   omeprazole  (PRILOSEC) 20 MG capsule TAKE 1 CAPSULE BY MOUTH EVERY DAY   sertraline  (ZOLOFT ) 100 MG tablet Take 2 tablets (200 mg total) by mouth daily.   simvastatin  (ZOCOR ) 40 MG tablet Take 1 tablet (40 mg total)  by mouth daily at 6 PM. Needs appt   temazepam  (RESTORIL ) 15 MG capsule TAKE 1 TO 2 CAPSULES BY MOUTH AT BEDTIME AS NEEDED   vitamin E 180 MG (400 UNITS) capsule Take 400 Units by mouth daily.   amoxicillin  (AMOXIL ) 875 MG tablet Take 1 tablet (875 mg total) by mouth 2 (two) times daily.   No facility-administered encounter medications on file as of 05/01/2024.    Allergies (verified) Other, Shellfish allergy, Codeine, and Dog epithelium   History: Past Medical History:  Diagnosis Date   Anxiety    takes Zoloft  daily;takes Clonazepam  daily as needed   Arthritis    Cataract 05/01/2024   left eye   Chronic back pain    stenosis   Diarrhea    occasionally   GERD (gastroesophageal reflux disease)    takes Omeprazole  daily    History of blood clots 10 yrs ago   left calf after a bone break   History of bronchitis 2014   History of colon polyps    benign   Hyperlipidemia    takes Simvastatin  daily   Hypertension    Insomnia    takes Melatonin nightly   Joint pain    Seasonal allergies    takes Claritin  daily;Nasonex and ProAir  as needed;Singulair  daily   Weakness    numbness and tingling in right leg   Past Surgical History:  Procedure Laterality Date   ANAL FISSURE REPAIR     ANKLE SURGERY Left 09/2017   colonosocpy     D&C of bladder  77yrs ago   ESOPHAGOGASTRODUODENOSCOPY     with ED   TONSILLECTOMY     WRIST SURGERY Right    with plate   Family History  Problem Relation Age of Onset   Cancer Mother    Retinitis pigmentosa Father    Depression Father    Anxiety disorder Sister    Arthritis Sister        had shoulder replacement and knee replacement   Melanoma Brother    Anxiety disorder Brother    Anorexia nervosa Brother    Anorexia nervosa Cousin    Leukemia Niece    Allergic rhinitis Neg Hx    Angioedema Neg Hx    Asthma Neg Hx    Eczema Neg Hx    Immunodeficiency Neg Hx    Urticaria Neg Hx    Breast cancer Neg Hx    Social History    Socioeconomic History   Marital status: Divorced    Spouse name: Not on file   Number of children: Not on file   Years of education: Not on file   Highest education level: Bachelor's degree (e.g., BA,  AB, BS)  Occupational History   Not on file  Tobacco Use   Smoking status: Never   Smokeless tobacco: Never  Vaping Use   Vaping status: Never Used  Substance and Sexual Activity   Alcohol use: Yes    Alcohol/week: 7.0 standard drinks of alcohol    Types: 7 Glasses of wine per week   Drug use: No   Sexual activity: Not on file  Other Topics Concern   Not on file  Social History Narrative   Not on file   Social Drivers of Health   Financial Resource Strain: Low Risk  (04/27/2024)   Overall Financial Resource Strain (CARDIA)    Difficulty of Paying Living Expenses: Not hard at all  Food Insecurity: No Food Insecurity (04/27/2024)   Hunger Vital Sign    Worried About Running Out of Food in the Last Year: Never true    Ran Out of Food in the Last Year: Never true  Transportation Needs: No Transportation Needs (04/27/2024)   PRAPARE - Administrator, Civil Service (Medical): No    Lack of Transportation (Non-Medical): No  Physical Activity: Insufficiently Active (04/27/2024)   Exercise Vital Sign    Days of Exercise per Week: 2 days    Minutes of Exercise per Session: 20 min  Stress: Stress Concern Present (05/01/2024)   [employer] of Occupational Health - Occupational Stress Questionnaire    Feeling of Stress: To some extent  Social Connections: Moderately Integrated (04/27/2024)   Social Connection and Isolation Panel    Frequency of Communication with Friends and Family: More than three times a week    Frequency of Social Gatherings with Friends and Family: More than three times a week    Attends Religious Services: More than 4 times per year    Active Member of [employer] or Organizations: Yes    Attends Engineer, structural: More than 4 times per  year    Marital Status: Divorced    Tobacco Counseling Counseling given: Not Answered    Clinical Intake:  Pre-visit preparation completed: Yes  Pain : 0-10 Pain Score: 8  Pain Type: Other (Comment) (right hip pain radiating down leg, seeing a specialist) Pain Location: Hip Pain Orientation: Right Pain Relieving Factors: ice, ibuprofen  Pain Relieving Factors: ice, ibuprofen  BMI - recorded: 26.53 Nutritional Status: BMI 25 -29 Overweight Nutritional Risks: None Diabetes: No  Lab Results  Component Value Date   HGBA1C 5.4 04/01/2023   HGBA1C 5.5 03/19/2022   HGBA1C 5.4 02/15/2020     How often do you need to have someone help you when you read instructions, pamphlets, or other written materials from your doctor or pharmacy?: 1 - Never What is the last grade level you completed in school?: Bachelor's degree  Interpreter Needed?: No  Information entered by :: Lolita Libra, CMA   Activities of Daily Living     04/27/2024   10:46 AM  In your present state of health, do you have any difficulty performing the following activities:  Hearing? 0  Vision? 0  Difficulty concentrating or making decisions? 0  Walking or climbing stairs? 0  Dressing or bathing? 0  Doing errands, shopping? 0  Preparing Food and eating ? N  Using the Toilet? N  In the past six months, have you accidently leaked urine? Y  Comment Reports has been intermittent but seems to be increasing. Needs to go as soon as she has the urge, also worse during the night. Wears  panty liner. Doesn't want to discuss with PCP yet.  Do you have problems with loss of bowel control? N  Managing your Medications? N  Managing your Finances? N  Housekeeping or managing your Housekeeping? Y    Patient Care Team: Copland, Harlene BROCKS, MD as PCP - General (Family Medicine) Franchot Harlene SQUIBB, PMHNP as Nurse Practitioner (Psychiatry) Radionchenko, Yulia V, MD as Referring Physician (Ophthalmology)  I have  updated your Care Teams any recent Medical Services you may have received from other providers in the past year.     Assessment:   This is a routine wellness examination for Syreeta.  Hearing/Vision screen Hearing Screening - Comments:: Denies hearing difficulties.  Vision Screening - Comments:: Wears RX glasses -- up to date with routine eye exams. Has cataract left eye, monitoring.    Goals Addressed   None    Depression Screen     05/01/2024    9:18 AM 04/01/2023   10:18 AM 11/10/2019   11:16 AM  PHQ 2/9 Scores  PHQ - 2 Score 1 2 0  PHQ- 9 Score 4 2     Fall Risk     04/27/2024   10:46 AM 04/01/2023   10:18 AM 11/10/2019   11:16 AM  Fall Risk   Falls in the past year? 1 1 0   Comment due to her hip pain / back issues that she see specialist for    Number falls in past yr: 1 1   Injury with Fall? 0 1   Risk for fall due to : Impaired balance/gait Other (Comment)   Follow up Falls evaluation completed Falls evaluation completed Education provided;Falls prevention discussed      Data saved with a previous flowsheet row definition    MEDICARE RISK AT HOME:  Medicare Risk at Home Any stairs in or around the home?: (Patient-Rptd) Yes If so, are there any without handrails?: (Patient-Rptd) No Home free of loose throw rugs in walkways, pet beds, electrical cords, etc?: (Patient-Rptd) Yes Adequate lighting in your home to reduce risk of falls?: (Patient-Rptd) Yes Life alert?: (Patient-Rptd) No Use of a cane, walker or w/c?: (Patient-Rptd) No Grab bars in the bathroom?: (Patient-Rptd) No Shower chair or bench in shower?: (Patient-Rptd) No Elevated toilet seat or a handicapped toilet?: (Patient-Rptd) Yes  TIMED UP AND GO:  Was the test performed?  Yes  Length of time to ambulate 10 feet: 5 sec Gait slow and steady without use of assistive device  Cognitive Function: 6CIT completed        05/01/2024    9:26 AM  6CIT Screen  What Year? 0 points  What month? 0 points   What time? 0 points  Count back from 20 0 points  Months in reverse 0 points  Repeat phrase 0 points  Total Score 0 points    Immunizations Immunization History  Administered Date(s) Administered   Fluad Quad(high Dose 65+) 07/14/2019   Hepatitis B 11/07/2009   Influenza Split 07/26/2023   Influenza,inj,Quad PF,6+ Mos 08/07/2015, 08/12/2016, 07/28/2018   Influenza,inj,quad, With Preservative 07/24/2014   Influenza-Unspecified 08/14/2005, 08/20/2006, 08/15/2007, 07/10/2008, 08/06/2010, 08/19/2011, 07/19/2012, 07/20/2013, 07/24/2014, 08/07/2015, 08/12/2016, 08/17/2017, 07/04/2020   PFIZER(Purple Top)SARS-COV-2 Vaccination 11/23/2019, 12/14/2019, 08/22/2020, 02/05/2021, 07/26/2023   Pfizer Covid-19 Vaccine Bivalent Booster 42yrs & up 08/22/2021   Pneumococcal Conjugate-13 08/12/2016   Pneumococcal Polysaccharide-23 07/24/2014, 02/15/2020   Td 02/15/2020   Tdap 11/07/2009   Zoster Recombinant(Shingrix) 02/26/2017, 05/02/2017    Screening Tests Health Maintenance  Topic Date Due  Hepatitis B Vaccines (2 of 3 - 19+ 3-dose series) 12/05/2009   Medicare Annual Wellness (AWV)  11/09/2020   COVID-19 Vaccine (7 - 2024-25 season) 05/01/2025 (Originally 09/20/2023)   INFLUENZA VACCINE  06/02/2024   MAMMOGRAM  12/12/2025   DTaP/Tdap/Td (3 - Td or Tdap) 02/14/2030   Colonoscopy  01/06/2033   Pneumococcal Vaccine: 50+ Years  Completed   DEXA SCAN  Completed   Hepatitis C Screening  Completed   Zoster Vaccines- Shingrix  Completed   HPV VACCINES  Aged Out   Meningococcal B Vaccine  Aged Out    Health Maintenance  Health Maintenance Due  Topic Date Due   Hepatitis B Vaccines (2 of 3 - 19+ 3-dose series) 12/05/2009   Medicare Annual Wellness (AWV)  11/09/2020   Health Maintenance Items Addressed: Needs remaining Hep B series, will discuss at upcoming OV with PCP.   Additional Screening:  Vision Screening: Recommended annual ophthalmology exams for early detection of glaucoma  and other disorders of the eye. Would you like a referral to an eye doctor? No    Dental Screening: Recommended annual dental exams for proper oral hygiene  Community Resource Referral / Chronic Care Management: CRR required this visit?  No   CCM required this visit?  No   Plan:    I have personally reviewed and noted the following in the patient's chart:   Medical and social history Use of alcohol, tobacco or illicit drugs  Current medications and supplements including opioid prescriptions. Patient is not currently taking opioid prescriptions. Functional ability and status Nutritional status Physical activity Advanced directives List of other physicians Hospitalizations, surgeries, and ER visits in previous 12 months Vitals Screenings to include cognitive, depression, and falls Referrals and appointments  In addition, I have reviewed and discussed with patient certain preventive protocols, quality metrics, and best practice recommendations. A written personalized care plan for preventive services as well as general preventive health recommendations were provided to patient.   Lolita Libra, CMA   05/01/2024   After Visit Summary: (In Person-Printed) AVS printed and given to the patient  Notes: See phone note.

## 2024-05-01 NOTE — Patient Instructions (Signed)
 Ashley Craig , Thank you for taking time out of your busy schedule to complete your Annual Wellness Visit with me. I enjoyed our conversation and look forward to speaking with you again next year. I, as well as your care team,  appreciate your ongoing commitment to your health goals. Please review the following plan we discussed and let me know if I can assist you in the future. Your Game plan/ To Do List    Follow up Visits: Next Medicare AWV with our clinical staff: 05/08/25 9am    Next Office Visit with your provider: 05/11/24 9:20am, Physical, drink plenty of water.  Clinician Recommendations:  Aim for 30 minutes of exercise or brisk walking, 6-8 glasses of water, and 5 servings of fruits and vegetables each day.       This is a list of the screening recommended for you and due dates:  Health Maintenance  Topic Date Due   Hepatitis B Vaccine (2 of 3 - 19+ 3-dose series) 12/05/2009   Medicare Annual Wellness Visit  11/09/2020   COVID-19 Vaccine (7 - 2024-25 season) 09/20/2023   Flu Shot  06/02/2024   Mammogram  12/12/2025   DTaP/Tdap/Td vaccine (3 - Td or Tdap) 02/14/2030   Colon Cancer Screening  01/06/2033   Pneumococcal Vaccine for age over 85  Completed   DEXA scan (bone density measurement)  Completed   Hepatitis C Screening  Completed   Zoster (Shingles) Vaccine  Completed   HPV Vaccine  Aged Out   Meningitis B Vaccine  Aged Out    Advanced directives: (Copy Requested) Please bring a copy of your health care power of attorney and living will to the office to be added to your chart at your convenience. You can mail to [facility] [address] [place], [place] [address] or email to [email or web address] Advance Care Planning is important because it:  [x]  Makes sure you receive the medical care that is consistent with your values, goals, and preferences  [x]  It provides guidance to your family and loved ones and reduces their decisional burden about  whether or not they are making the right decisions based on your wishes.  Follow the link provided in your after visit summary or read over the paperwork we have mailed to you to help you started getting your Advance Directives in place. If you need assistance in completing these, please reach out to us  so that we can help you!  See attachments for Preventive Care and Fall Prevention Tips.

## 2024-05-01 NOTE — Progress Notes (Signed)
 [facility] Check  Patient ID: Ashley Craig,  MRN: [id number]  PCP: Watt Harlene BROCKS, MD  Date of Evaluation: 05/01/2024 Time spent:20 minutes  Chief Complaint:  Chief Complaint   Anxiety; Follow-up    HISTORY/CURRENT STATUS: HPI  For routine med check  She is doing well as far as her medications are concerned. Energy and motivation are good.  No extreme sadness, tearfulness, or feelings of hopelessness.  ADLs and personal hygiene are normal.   Denies any changes in concentration, making decisions, or remembering things.  Appetite has not changed.  Weight is stable.  Anxiety has been a little worse due to her physical health issues.  She has been taking the Klonopin  more than she usually does.  It is still effective.  No mania, psychosis, or delirium.  Denies suicidal or homicidal thoughts.  She is a little bummed out because of right hip pain and she may need surgery because of it.  She has not been sleeping as well as usual because of the pain.  She does take the temazepam  sometimes but it has not been helping, the pain trumps the efficacy.  She is seeing ortho for this.  Denies dizziness, syncope, seizures, numbness, tingling, tremor, tics, unsteady gait, slurred speech, confusion.   Individual Medical History/ Review of Systems: Changes? :Yes     Past Psychiatric Medication Trials: Klonopin - Effective for anxiety. She reports that she has gone weeks without taking it and takes more of it during times of increased stress Lamictal - Has helped stabilize mood. Reports that she has been on higher and lower doses. Has taken at least one year.  Effexor XR- Severe discontinuation s/s.  Prozac Zoloft - Has taken long-term Temazepam - Has used prn when traveling Gabapentin - Difficulty with balance. Took for pain. Buspar  Allergies: Other, Shellfish allergy, Codeine, and Dog epithelium  Current Medications:  Current Outpatient Medications:    albuterol  (PROVENTIL ) (2.5 MG/3ML)  0.083% nebulizer solution, Take 3 mLs (2.5 mg total) by nebulization every 6 (six) hours as needed for wheezing or shortness of breath., Disp: 75 mL, Rfl: 1   albuterol  (VENTOLIN  HFA) 108 (90 Base) MCG/ACT inhaler, Inhale 2 puffs into the lungs every 6 (six) hours as needed for wheezing or shortness of breath., Disp: 8 g, Rfl: 2   alendronate  (FOSAMAX ) 70 MG tablet, Take 1 tablet (70 mg total) by mouth once a week. Take with a full glass of water on an empty stomach., Disp: 12 tablet, Rfl: 3   Azelastine  HCl 137 MCG/SPRAY SOLN, Place 2 sprays into both nostrils 2 (two) times daily as needed., Disp: 90 mL, Rfl: 1   busPIRone  (BUSPAR ) 15 MG tablet, Take 1.5 tablets (22.5 mg total) by mouth 2 (two) times daily., Disp: , Rfl:    EPINEPHrine  (EPIPEN  2-PAK) 0.3 mg/0.3 mL IJ SOAJ injection, Inject 0.3 mg into the muscle as needed for anaphylaxis., Disp: 2 each, Rfl: 1   lisinopril  (ZESTRIL ) 5 MG tablet, Take 1 tablet (5 mg total) by mouth daily., Disp: 90 tablet, Rfl: 3   loratadine  (CLARITIN ) 10 MG tablet, TAKE 1 TABLET BY MOUTH EVERY DAY, Disp: 90 tablet, Rfl: 1   Melatonin 3 MG TABS, Take 3 mg by mouth at bedtime. Reported on 11/05/2015, Disp: , Rfl:    montelukast  (SINGULAIR ) 10 MG tablet, Take 1 tablet (10 mg total) by mouth daily., Disp: 90 tablet, Rfl: 1   Omega-3 Fatty Acids (FISH OIL PO), Take by mouth., Disp: , Rfl:    omeprazole  (PRILOSEC) 20 MG capsule, TAKE  1 CAPSULE BY MOUTH EVERY DAY, Disp: 90 capsule, Rfl: 1   sertraline  (ZOLOFT ) 100 MG tablet, Take 2 tablets (200 mg total) by mouth daily., Disp: 180 tablet, Rfl: 1   simvastatin  (ZOCOR ) 40 MG tablet, Take 1 tablet (40 mg total) by mouth daily at 6 PM. Needs appt, Disp: 30 tablet, Rfl: 0   vitamin E 180 MG (400 UNITS) capsule, Take 400 Units by mouth daily., Disp: , Rfl:    amoxicillin  (AMOXIL ) 875 MG tablet, Take 1 tablet (875 mg total) by mouth 2 (two) times daily., Disp: 20 tablet, Rfl: 0   Calcium Carb-Cholecalciferol 600-10 MG-MCG TABS,  Take by mouth. (Patient taking differently: Take 1 tablet by mouth in the morning and at bedtime.), Disp: , Rfl:    clonazePAM  (KLONOPIN ) 0.5 MG tablet, Take 1 tablet (0.5 mg total) by mouth daily as needed for anxiety. TAKE 1 TABLET BY MOUTH EVERY DAY AS NEEDED FOR ANXIETY, Disp: 30 tablet, Rfl: 5   Cyanocobalamin (VITAMIN B 12 PO), Take by mouth. (Patient taking differently: Take 1 tablet by mouth in the morning and at bedtime.), Disp: , Rfl:    lamoTRIgine  (LAMICTAL ) 150 MG tablet, Take 2 tablets (300 mg total) by mouth daily., Disp: 180 tablet, Rfl: 1   temazepam  (RESTORIL ) 15 MG capsule, TAKE 1 TO 2 CAPSULES BY MOUTH AT BEDTIME AS NEEDED, Disp: 30 capsule, Rfl: 5 Medication Side Effects: none  Family Medical/ Social History: Changes? No  MENTAL HEALTH EXAM:  There were no vitals taken for this visit.There is no height or weight on file to calculate BMI.  General Appearance: Casual and Well Groomed  Eye Contact:  Good  Speech:  Clear and Coherent and Normal Rate  Volume:  Normal  Mood:  Euthymic  Affect:  Congruent  Thought Process:  Goal Directed and Descriptions of Associations: Circumstantial  Orientation:  Full (Time, Place, and Person)  Thought Content: Logical   Suicidal Thoughts:  No  Homicidal Thoughts:  No  Memory:  WNL  Judgement:  Good  Insight:  Good  Psychomotor Activity:  Normal  Concentration:  Concentration: Good and Attention Span: Good  Recall:  Good  Fund of Knowledge: Good  Language: Good  Assets:  Communication Skills Desire for Improvement Financial Resources/Insurance Housing Transportation  ADL's:  Intact  Cognition: WNL  Prognosis:  Good   DIAGNOSES:    ICD-10-CM   1. Generalized anxiety disorder  F41.1 clonazePAM  (KLONOPIN ) 0.5 MG tablet    2. Depression, unspecified depression type  F32.A lamoTRIgine  (LAMICTAL ) 150 MG tablet    3. Insomnia, unspecified type  G47.00 temazepam  (RESTORIL ) 15 MG capsule      Receiving Psychotherapy: No    RECOMMENDATIONS:   PDMP reviewed.  Klonopin  filled 02/28/2024. I provided approximately 20 minutes of face to face time during this encounter, including time spent before and after the visit in records review, medical decision making, counseling pertinent to today's visit, and charting.   As far as her mental health medications go she is stable so no changes will be made.  Continue BuSpar  15 mg, 1.5 pills p.o. twice daily. Continue Klonopin  0.5 mg, 1 p.o. daily as needed. Continue Lamictal  150 mg, 2 p.o. daily. Continue Zoloft  100 mg, 2 p.o. daily. Continue temazepam  15 mg, 1-2 p.o. nightly as needed sleep. Return in 6 months.  Verneita Cooks, PA-C

## 2024-05-01 NOTE — Telephone Encounter (Signed)
 Pt had AWV today.  Care gap show she needs to complete Hep B series.  I made an appt for her to have a cpe on 7/7 to get 2nd Hep B vaccine then will need nurse visit to complete the series.  Also, she is awaiting right hip replacement.  In a lot of pain and is causing decrease in physical activities and causing some stress and she reports feeling down and out because of this. Still has a positive outlook overall.

## 2024-05-08 NOTE — Patient Instructions (Signed)
 Great to see you again today!  I will be in touch with your labs

## 2024-05-08 NOTE — Progress Notes (Unsigned)
 [facility] [address] [place], [place] [address] [phone]  Date:  05/11/2024   Name:  Ashley Craig   DOB:  Oct 25, 1951   MRN:  [id number]  PCP:  Watt Harlene BROCKS, MD    Chief Complaint: No chief complaint on file.   History of Present Illness:  Ashley Craig is a 73 y.o. very pleasant female patient who presents with the following:  Pt seen today for CPE Last seen by myself in January for cerumen impaction  History of allergies, GERD, vertigo, HTN, Fuch's corneal dystrophy, likely benign elevation of alk phos  She had a lumbar operation in February 2023 per Dr. Burman and has a spinal cord stimulator.  Osteopenia on fosamax     Most recent visit with Dr Marlyce last month-  HISTORY: Ashley Craig is a 73 y.o. female who returns to clinic today with low back pain. She has a history of L5-S1 decompression and fusion performed by Dr. Nudleman in 2015. She's s/p revision L3-S1 decompression and PSF/TLIF with removal and replacement of hardware on 12/17/21. Patient is presenting right sided low back pain that radiates down the RLE to the medial ankle. She has a positive Right Seated Slump test. No changes to bowel and bladder and no saddle anesthesia. Pain Is a 8 out of 10.  Assessment/Plan: 73 year old female that is seen today as a follow-up evaluation. Patient recently underwent a Right SI Joint Injection, this did not provide significant relief. Patient comes today for further evaluation. During office visit she demonstrated limping on her right leg while transitioning from sitting to standing. On physical exam she shows pain on her right hip on flexion and internal rotation.. X-rays show decreased joint space on the superior lateral aspect of the hip consistent with hip arthritis. Symptoms likely secondary to her hip. At this point, I will refer to Dr. Saullo for a intra-articular right hip arthritis.  Medications reviewed, she is currently taking Diclofenac  for better pain control.  Today we will refer back to Dr. Saullo for a Intra Articular hip injectionRIGHT, with Dr. Darlean.   She is seen by Harlene Pepper at [facility] for depression and anxiety and also has a counselor  Her ophthalmologist is Dr. Radiochenko- last seen in April   Coronary calcium last year- score 0 Would like to do her Hep B series; she had one dose in 2011 Shingrix UTD Pneumonia complete  Mammo 2/25 Colon 3/24 Dexa 5/23- can update, osteopenia   Sertraline , temazepam , lamictal , buspar  Fosamax   Simvastatin  Lisinopril  singulari    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 risk in both eyes 06/02/2016   Pinguecula of both eyes 06/02/2016   Presbyopia of both eyes 06/02/2016   Regular astigmatism of both eyes 06/02/2016   Varicose veins of left lower extremity with pain 01/16/2016   Mild persistent asthma without complication 11/05/2015   Chronic venous insufficiency 08/07/2015   Spontaneous  hematoma of lower leg 11/30/2014   Spinal stenosis of lumbar region 10/11/2014   Adjustment disorder 04/27/2014   Benign essential hypertension 04/27/2014   Benign paroxysmal vertigo 04/27/2014   Chondrocostal junction syndrome 04/27/2014   Elevation of level of transaminase or lactic acid dehydrogenase (LDH) 04/27/2014   Fuchs' corneal dystrophy  04/27/2014   Overweight 04/27/2014   Spontaneous ecchymosis 04/27/2014   Tendonitis of shoulder 04/27/2014   Anxiety 03/27/2014   Environmental allergies 03/27/2014   Hypercholesteremia 03/27/2014   Insomnia 03/27/2014   Vitamin D  deficiency 03/27/2014    Past Medical History:  Diagnosis Date   Anxiety    takes Zoloft  daily;takes Clonazepam  daily as needed   Arthritis    Cataract 05/01/2024   left eye   Chronic back pain    stenosis   Diarrhea    occasionally   GERD (gastroesophageal reflux disease)    takes Omeprazole  daily    History of blood clots 10 yrs ago   left calf after a bone break   History of bronchitis 2014   History of colon polyps    benign   Hyperlipidemia    takes Simvastatin  daily   Hypertension    Insomnia    takes Melatonin nightly   Joint pain    Seasonal allergies    takes Claritin  daily;Nasonex and ProAir  as needed;Singulair  daily   Weakness    numbness and tingling in right leg    Past Surgical History:  Procedure Laterality Date   ANAL FISSURE REPAIR     ANKLE SURGERY Left 09/2017   colonosocpy     D&C of bladder  3yrs ago   ESOPHAGOGASTRODUODENOSCOPY     with ED   TONSILLECTOMY     WRIST SURGERY Right    with plate    Social History   Tobacco Use   Smoking status: Never   Smokeless tobacco: Never  Vaping Use   Vaping status: Never Used  Substance Use Topics   Alcohol use: Yes    Alcohol/week: 7.0 standard drinks of alcohol    Types: 7 Glasses of wine per week   Drug use: No    Family History  Problem Relation Age of Onset   Cancer Mother    Retinitis pigmentosa Father    Depression Father    Anxiety disorder Sister    Arthritis Sister        had shoulder replacement and knee replacement   Melanoma Brother    Anxiety disorder Brother    Anorexia nervosa Brother    Anorexia nervosa Cousin    Leukemia Niece    Allergic rhinitis Neg Hx    Angioedema Neg Hx    Asthma Neg Hx    Eczema Neg Hx    Immunodeficiency  Neg Hx    Urticaria Neg Hx    Breast cancer Neg Hx     Allergies  Allergen Reactions   Other Anaphylaxis    Shellfish-Anaphylaxis   Shellfish Allergy Other (See Comments) and Swelling   Codeine     nausea   Dog Epithelium     Medication list has been reviewed and updated.  Current Outpatient Medications on File Prior to Visit  Medication Sig Dispense Refill   albuterol  (PROVENTIL ) (2.5 MG/3ML) 0.083% nebulizer solution Take 3 mLs (2.5 mg total) by nebulization every 6 (six) hours as needed for wheezing or shortness of breath. 75 mL 1   albuterol  (VENTOLIN  HFA) 108 (90 Base) MCG/ACT inhaler Inhale 2 puffs into the lungs every 6 (six)  hours as needed for wheezing or shortness of breath. 8 g 2   alendronate  (FOSAMAX ) 70 MG tablet Take 1 tablet (70 mg total) by mouth once a week. Take with a full glass of water on an empty stomach. 12 tablet 3   Azelastine  HCl 137 MCG/SPRAY SOLN Place 2 sprays into both nostrils 2 (two) times daily as needed. 90 mL 1   busPIRone  (BUSPAR ) 15 MG tablet Take 1.5 tablets (22.5 mg total) by mouth 2 (two) times daily.     Calcium Carb-Cholecalciferol 600-10 MG-MCG TABS Take by mouth. (Patient taking differently: Take 1 tablet by mouth in the morning and at bedtime.)     clonazePAM  (KLONOPIN ) 0.5 MG tablet Take 1 tablet (0.5 mg total) by mouth daily as needed for anxiety. TAKE 1 TABLET BY MOUTH EVERY DAY AS NEEDED FOR ANXIETY 30 tablet 5   Cyanocobalamin (VITAMIN B 12 PO) Take by mouth. (Patient taking differently: Take 1 tablet by mouth in the morning and at bedtime.)     EPINEPHrine  (EPIPEN  2-PAK) 0.3 mg/0.3 mL IJ SOAJ injection Inject 0.3 mg into the muscle as needed for anaphylaxis. 2 each 1   lamoTRIgine  (LAMICTAL ) 150 MG tablet Take 2 tablets (300 mg total) by mouth daily. 180 tablet 1   lisinopril  (ZESTRIL ) 5 MG tablet Take 1 tablet (5 mg total) by mouth daily. 90 tablet 3   loratadine  (CLARITIN ) 10 MG tablet TAKE 1 TABLET BY MOUTH EVERY DAY 90 tablet 1    Melatonin 3 MG TABS Take 3 mg by mouth at bedtime. Reported on 11/05/2015     montelukast  (SINGULAIR ) 10 MG tablet Take 1 tablet (10 mg total) by mouth daily. 90 tablet 1   Omega-3 Fatty Acids (FISH OIL PO) Take by mouth.     omeprazole  (PRILOSEC) 20 MG capsule TAKE 1 CAPSULE BY MOUTH EVERY DAY 90 capsule 1   sertraline  (ZOLOFT ) 100 MG tablet Take 2 tablets (200 mg total) by mouth daily. 180 tablet 1   simvastatin  (ZOCOR ) 40 MG tablet Take 1 tablet (40 mg total) by mouth daily at 6 PM. Needs appt 30 tablet 0   temazepam  (RESTORIL ) 15 MG capsule TAKE 1 TO 2 CAPSULES BY MOUTH AT BEDTIME AS NEEDED 30 capsule 5   vitamin E 180 MG (400 UNITS) capsule Take 400 Units by mouth daily.     No current facility-administered medications on file prior to visit.    Review of Systems:  As per HPI- otherwise negative.   Physical Examination: There were no vitals filed for this visit. There were no vitals filed for this visit. There is no height or weight on file to calculate BMI. Ideal Body Weight:    GEN: no acute distress. HEENT: Atraumatic, Normocephalic.  Ears and Nose: No external deformity. CV: RRR, No M/G/R. No JVD. No thrill. No extra heart sounds. PULM: CTA B, no wheezes, crackles, rhonchi. No retractions. No resp. distress. No accessory muscle use. ABD: S, NT, ND, +BS. No rebound. No HSM. EXTR: No c/c/e PSYCH: Normally interactive. Conversant.    Assessment and Plan: *** Physical exam- encouraged healthy diet and exercise routine  Signed Harlene Schroeder, MD

## 2024-05-09 DIAGNOSIS — M1611 Unilateral primary osteoarthritis, right hip: Secondary | ICD-10-CM | POA: Diagnosis not present

## 2024-05-11 ENCOUNTER — Encounter: Payer: Self-pay | Admitting: Family Medicine

## 2024-05-11 ENCOUNTER — Ambulatory Visit (INDEPENDENT_AMBULATORY_CARE_PROVIDER_SITE_OTHER): Admitting: Family Medicine

## 2024-05-11 VITALS — BP 134/78 | HR 68 | Ht 67.0 in | Wt 170.8 lb

## 2024-05-11 DIAGNOSIS — Z131 Encounter for screening for diabetes mellitus: Secondary | ICD-10-CM | POA: Diagnosis not present

## 2024-05-11 DIAGNOSIS — Z0000 Encounter for general adult medical examination without abnormal findings: Secondary | ICD-10-CM

## 2024-05-11 DIAGNOSIS — E559 Vitamin D deficiency, unspecified: Secondary | ICD-10-CM

## 2024-05-11 DIAGNOSIS — Z1329 Encounter for screening for other suspected endocrine disorder: Secondary | ICD-10-CM

## 2024-05-11 DIAGNOSIS — J4530 Mild persistent asthma, uncomplicated: Secondary | ICD-10-CM | POA: Diagnosis not present

## 2024-05-11 DIAGNOSIS — E785 Hyperlipidemia, unspecified: Secondary | ICD-10-CM

## 2024-05-11 DIAGNOSIS — Z13220 Encounter for screening for lipoid disorders: Secondary | ICD-10-CM | POA: Diagnosis not present

## 2024-05-11 DIAGNOSIS — I10 Essential (primary) hypertension: Secondary | ICD-10-CM | POA: Diagnosis not present

## 2024-05-11 DIAGNOSIS — R748 Abnormal levels of other serum enzymes: Secondary | ICD-10-CM | POA: Diagnosis not present

## 2024-05-11 DIAGNOSIS — M8580 Other specified disorders of bone density and structure, unspecified site: Secondary | ICD-10-CM

## 2024-05-11 DIAGNOSIS — Z130 Encounter for screening for diseases of the blood and blood-forming organs and certain disorders involving the immune mechanism: Secondary | ICD-10-CM

## 2024-05-11 LAB — CBC
HCT: 41 % (ref 36.0–46.0)
Hemoglobin: 13.6 g/dL (ref 12.0–15.0)
MCHC: 33.2 g/dL (ref 30.0–36.0)
MCV: 88.9 fl (ref 78.0–100.0)
Platelets: 258 K/uL (ref 150.0–400.0)
RBC: 4.61 Mil/uL (ref 3.87–5.11)
RDW: 13.2 % (ref 11.5–15.5)
WBC: 4.9 K/uL (ref 4.0–10.5)

## 2024-05-11 LAB — COMPREHENSIVE METABOLIC PANEL WITH GFR
ALT: 27 U/L (ref 0–35)
AST: 28 U/L (ref 0–37)
Albumin: 4.5 g/dL (ref 3.5–5.2)
Alkaline Phosphatase: 76 U/L (ref 39–117)
BUN: 17 mg/dL (ref 6–23)
CO2: 31 meq/L (ref 19–32)
Calcium: 9 mg/dL (ref 8.4–10.5)
Chloride: 101 meq/L (ref 96–112)
Creatinine, Ser: 0.77 mg/dL (ref 0.40–1.20)
GFR: 77.96 mL/min (ref >60.00)
Glucose, Bld: 83 mg/dL (ref 70–99)
Potassium: 5.2 meq/L — ABNORMAL HIGH (ref 3.5–5.1)
Sodium: 137 meq/L (ref 135–145)
Total Bilirubin: 0.7 mg/dL (ref 0.2–1.2)
Total Protein: 6.7 g/dL (ref 6.0–8.3)

## 2024-05-11 LAB — LIPID PANEL
Cholesterol: 214 mg/dL — ABNORMAL HIGH (ref 0–200)
HDL: 116.4 mg/dL (ref >39.00)
LDL Cholesterol: 77 mg/dL (ref 0–99)
NonHDL: 97.65
Total CHOL/HDL Ratio: 2
Triglycerides: 101 mg/dL (ref 0.0–149.0)
VLDL: 20.2 mg/dL (ref 0.0–40.0)

## 2024-05-11 LAB — VITAMIN D 25 HYDROXY (VIT D DEFICIENCY, FRACTURES): VITD: 28.24 ng/mL — ABNORMAL LOW (ref 30.00–100.00)

## 2024-05-11 LAB — HEMOGLOBIN A1C: Hgb A1c MFr Bld: 5.4 % (ref 4.6–6.5)

## 2024-05-11 LAB — TSH: TSH: 2.6 u[IU]/mL (ref 0.35–5.50)

## 2024-05-11 MED ORDER — SIMVASTATIN 40 MG PO TABS: 40.0000 mg | ORAL_TABLET | Freq: Every day | ORAL | 3 refills | Status: AC

## 2024-05-11 MED ORDER — MONTELUKAST SODIUM 10 MG PO TABS: 10.0000 mg | ORAL_TABLET | Freq: Every day | ORAL | 3 refills | Status: AC

## 2024-05-11 MED ORDER — LOSARTAN POTASSIUM 25 MG PO TABS
25.0000 mg | ORAL_TABLET | Freq: Every day | ORAL | 3 refills | Status: AC
Start: 2024-05-11 — End: ?

## 2024-05-14 ENCOUNTER — Other Ambulatory Visit: Payer: Self-pay | Admitting: Family Medicine

## 2024-05-14 DIAGNOSIS — J4530 Mild persistent asthma, uncomplicated: Secondary | ICD-10-CM

## 2024-05-24 DIAGNOSIS — M1611 Unilateral primary osteoarthritis, right hip: Secondary | ICD-10-CM | POA: Diagnosis not present

## 2024-05-31 ENCOUNTER — Other Ambulatory Visit: Payer: Self-pay

## 2024-05-31 DIAGNOSIS — F411 Generalized anxiety disorder: Secondary | ICD-10-CM

## 2024-05-31 MED ORDER — BUSPIRONE HCL 15 MG PO TABS: 22.5000 mg | ORAL_TABLET | Freq: Two times a day (BID) | ORAL | 1 refills | Status: DC

## 2024-06-07 DIAGNOSIS — L814 Other melanin hyperpigmentation: Secondary | ICD-10-CM | POA: Diagnosis not present

## 2024-06-07 DIAGNOSIS — L918 Other hypertrophic disorders of the skin: Secondary | ICD-10-CM | POA: Diagnosis not present

## 2024-06-07 DIAGNOSIS — L821 Other seborrheic keratosis: Secondary | ICD-10-CM | POA: Diagnosis not present

## 2024-06-07 DIAGNOSIS — Z85828 Personal history of other malignant neoplasm of skin: Secondary | ICD-10-CM | POA: Diagnosis not present

## 2024-06-07 DIAGNOSIS — Z08 Encounter for follow-up examination after completed treatment for malignant neoplasm: Secondary | ICD-10-CM | POA: Diagnosis not present

## 2024-06-09 DIAGNOSIS — M1611 Unilateral primary osteoarthritis, right hip: Secondary | ICD-10-CM | POA: Diagnosis not present

## 2024-06-09 DIAGNOSIS — M963 Postlaminectomy kyphosis: Secondary | ICD-10-CM | POA: Diagnosis not present

## 2024-06-15 DIAGNOSIS — M1611 Unilateral primary osteoarthritis, right hip: Secondary | ICD-10-CM | POA: Diagnosis not present

## 2024-06-16 DIAGNOSIS — Z86718 Personal history of other venous thrombosis and embolism: Secondary | ICD-10-CM | POA: Diagnosis not present

## 2024-06-16 DIAGNOSIS — J4530 Mild persistent asthma, uncomplicated: Secondary | ICD-10-CM | POA: Diagnosis not present

## 2024-06-16 DIAGNOSIS — I10 Essential (primary) hypertension: Secondary | ICD-10-CM | POA: Diagnosis not present

## 2024-06-16 DIAGNOSIS — Z01818 Encounter for other preprocedural examination: Secondary | ICD-10-CM | POA: Diagnosis not present

## 2024-06-16 DIAGNOSIS — Z01812 Encounter for preprocedural laboratory examination: Secondary | ICD-10-CM | POA: Diagnosis not present

## 2024-06-16 DIAGNOSIS — I824Y2 Acute embolism and thrombosis of unspecified deep veins of left proximal lower extremity: Secondary | ICD-10-CM | POA: Diagnosis not present

## 2024-06-16 DIAGNOSIS — F419 Anxiety disorder, unspecified: Secondary | ICD-10-CM | POA: Diagnosis not present

## 2024-06-16 DIAGNOSIS — J45909 Unspecified asthma, uncomplicated: Secondary | ICD-10-CM | POA: Diagnosis not present

## 2024-06-21 DIAGNOSIS — E785 Hyperlipidemia, unspecified: Secondary | ICD-10-CM | POA: Diagnosis not present

## 2024-06-21 DIAGNOSIS — Z86718 Personal history of other venous thrombosis and embolism: Secondary | ICD-10-CM | POA: Diagnosis not present

## 2024-06-21 DIAGNOSIS — Z7901 Long term (current) use of anticoagulants: Secondary | ICD-10-CM | POA: Diagnosis not present

## 2024-06-21 DIAGNOSIS — Z9089 Acquired absence of other organs: Secondary | ICD-10-CM | POA: Diagnosis not present

## 2024-06-21 DIAGNOSIS — G4700 Insomnia, unspecified: Secondary | ICD-10-CM | POA: Diagnosis not present

## 2024-06-21 DIAGNOSIS — Z91013 Allergy to seafood: Secondary | ICD-10-CM | POA: Diagnosis not present

## 2024-06-21 DIAGNOSIS — F419 Anxiety disorder, unspecified: Secondary | ICD-10-CM | POA: Diagnosis not present

## 2024-06-21 DIAGNOSIS — Z7982 Long term (current) use of aspirin: Secondary | ICD-10-CM | POA: Diagnosis not present

## 2024-06-21 DIAGNOSIS — Z96641 Presence of right artificial hip joint: Secondary | ICD-10-CM | POA: Diagnosis not present

## 2024-06-21 DIAGNOSIS — Z885 Allergy status to narcotic agent status: Secondary | ICD-10-CM | POA: Diagnosis not present

## 2024-06-21 DIAGNOSIS — Z91048 Other nonmedicinal substance allergy status: Secondary | ICD-10-CM | POA: Diagnosis not present

## 2024-06-21 DIAGNOSIS — Z79899 Other long term (current) drug therapy: Secondary | ICD-10-CM | POA: Diagnosis not present

## 2024-06-21 DIAGNOSIS — M1611 Unilateral primary osteoarthritis, right hip: Secondary | ICD-10-CM | POA: Diagnosis not present

## 2024-06-21 DIAGNOSIS — Z471 Aftercare following joint replacement surgery: Secondary | ICD-10-CM | POA: Diagnosis not present

## 2024-06-21 DIAGNOSIS — K219 Gastro-esophageal reflux disease without esophagitis: Secondary | ICD-10-CM | POA: Diagnosis not present

## 2024-06-21 DIAGNOSIS — J45909 Unspecified asthma, uncomplicated: Secondary | ICD-10-CM | POA: Diagnosis not present

## 2024-06-21 DIAGNOSIS — I10 Essential (primary) hypertension: Secondary | ICD-10-CM | POA: Diagnosis not present

## 2024-06-22 ENCOUNTER — Other Ambulatory Visit (HOSPITAL_BASED_OUTPATIENT_CLINIC_OR_DEPARTMENT_OTHER)

## 2024-06-27 ENCOUNTER — Telehealth: Payer: Self-pay | Admitting: *Deleted

## 2024-06-27 NOTE — Transitions of Care (Post Inpatient/ED Visit) (Signed)
 06/27/2024  Name: Ashley Craig MRN: [id number] DOB: September 16, 1951  Today's TOC FU Call Status: Today's TOC FU Call Status:: Successful TOC FU Call Completed TOC FU Call Complete Date: 06/27/24 Patient's Name and Date of Birth confirmed.  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, RIGHT- How have you been since you were released from the hospital?: Better Any questions or concerns?: No  Items Reviewed: Did you receive and understand the discharge instructions provided?: Yes Medications obtained,verified, and reconciled?: Yes (Medications Reviewed) Any new allergies since your discharge?: No Dietary orders reviewed?: No Do you have support at home?: Yes People in Home [RPT]: sibling(s) Name of Support/Comfort Primary Source: Joann  Medications Reviewed Today: Medications Reviewed Today     Reviewed by Kennieth Cathlean DEL, RN (Case Manager) on 06/27/24 at 1215  Med List Status: <None>   Medication Order Taking? Sig Documenting Provider Last Dose Status Informant  albuterol  (PROVENTIL ) (2.5 MG/3ML) 0.083% nebulizer solution [id number] Yes Take 3 mLs (2.5 mg total) by nebulization every 6 (six) hours as needed for wheezing or shortness of breath. Copland, Harlene BROCKS, MD  Active   albuterol  (VENTOLIN  HFA) 108 (90 Base) MCG/ACT inhaler [id number] Yes Inhale 2 puffs into the lungs every 6 (six) hours as needed for wheezing or shortness of breath. Copland, Harlene BROCKS, MD  Active   alendronate  (FOSAMAX ) 70 MG tablet [id number] Yes Take 1 tablet (70 mg total) by mouth once a week. Take with a full glass of water on an empty stomach. Copland, Harlene BROCKS, MD  Active   Azelastine  HCl 137 MCG/SPRAY SOLN [id number] Yes  Place 2 sprays into both nostrils 2 (two) times daily as needed. Copland, Harlene BROCKS, MD  Active   busPIRone  (BUSPAR ) 15 MG tablet [id number] Yes Take 1.5 tablets (22.5 mg total) by mouth 2 (two) times daily. Mozingo, Regina Nattalie, NP  Active   Calcium Carb-Cholecalciferol 600-10 MG-MCG TABS [id number] Yes Take by mouth.  Patient taking differently: Take 1 tablet by mouth in the morning and at bedtime.   [provider]  Active   cefadroxil (DURICEF) 500 MG capsule [id number] Yes Take 500 mg by mouth 2 (two) times daily. daily for 7 days [provider]  Active   clonazePAM  (KLONOPIN ) 0.5 MG tablet [id number]  Take 1 tablet (0.5 mg total) by mouth daily as needed for anxiety. TAKE 1 TABLET BY MOUTH EVERY DAY AS NEEDED FOR ANXIETY Rhys, Teresa T, PA-C  Active   clonazePAM  (KLONOPIN ) 0.5 MG tablet [id number] Yes Take 0.5 mg by mouth 2 (two) times daily as needed for anxiety. [provider]  Active   Cyanocobalamin (VITAMIN B 12 PO) [id number] Yes Take by mouth.  Patient taking differently: Take 1 tablet by mouth in the morning and at bedtime.   [provider]  Active   docusate sodium (COLACE) 100 MG capsule [id number] Yes Take 200 mg by mouth daily as needed for mild constipation. [provider]  Active   EPINEPHrine  (EPIPEN  2-PAK) 0.3 mg/0.3 mL IJ SOAJ injection [id number] Yes Inject 0.3 mg into the muscle as needed for anaphylaxis. Copland, Harlene BROCKS, MD  Active   lamoTRIgine  (LAMICTAL ) 150 MG tablet [id number]  Take 2 tablets (300 mg total) by mouth daily. Rhys Verneita DASEN, PA-C  Active   loratadine  (CLARITIN ) 10 MG tablet [id number]  TAKE 1 TABLET BY MOUTH EVERY DAY Ambs, Arlean HERO, FNP  Active   losartan  (COZAAR ) 25 MG tablet [id number]  Take 1 tablet (25 mg total) by mouth daily. Copland, Harlene BROCKS, MD  Active            Med Note LORICE CATHLEAN DEL   Tue Jun 27, 2024 12:15 PM) Patient will pick up today  Melatonin 3 MG TABS [id number] Yes Take 3 mg by mouth  at bedtime. Reported on 11/05/2015 [provider]  Active Self  methocarbamol (ROBAXIN) 500 MG tablet [id number] Yes Take 500 mg by mouth 4 (four) times daily. daily for 10 days [provider]  Active   montelukast  (SINGULAIR ) 10 MG tablet [id number] Yes Take 1 tablet (10 mg total) by mouth daily. Copland, Harlene BROCKS, MD  Active   Omega-3 Fatty Acids (FISH OIL PO) [id number] Yes Take by mouth. [provider]  Active   omeprazole  (PRILOSEC) 20 MG capsule [id number]  TAKE 1 CAPSULE BY MOUTH EVERY DAY Ambs, Arlean HERO, FNP  Active   ondansetron  (ZOFRAN ) 4 MG tablet [id number] Yes Take 4 mg by mouth every 8 (eight) hours as needed for nausea or vomiting. for up to 7 days [provider]  Active   oxyCODONE  (OXY IR/ROXICODONE ) 5 MG immediate release tablet [id number] Yes Take 5 mg by mouth every 6 (six) hours as needed for severe pain (pain score 7-10). as needed for Pain for up to 7 days [provider]  Active   rivaroxaban (XARELTO) 10 MG TABS tablet [id number] Yes Take 10 mg by mouth daily. daily for 30 days [provider]  Active   sertraline  (ZOLOFT ) 100 MG tablet [id number] Yes Take 2 tablets (200 mg total) by mouth daily. Franchot Harlene SQUIBB, PMHNP  Active   simvastatin  (ZOCOR ) 40 MG tablet [id number]  Take 1 tablet (40 mg total) by mouth daily at 6 PM. Copland, Jessica C, MD  Active   temazepam  (RESTORIL ) 15 MG capsule [id number] Yes TAKE 1 TO 2 CAPSULES BY MOUTH AT BEDTIME AS NEEDED Rhys Verneita DASEN, PA-C  Active   vitamin E 180 MG (400 UNITS) capsule [id number] Yes Take 400 Units by mouth daily. [provider]  Active             Home Care and Equipment/Supplies: Were Home Health Services Ordered?: Yes Name of Home Health Agency:: [facility] Has Agency set up a time to come to your home?: Yes First Home Health Visit Date: 06/28/24 Any new equipment or medical supplies ordered?: No  Functional Questionnaire: Do you need assistance with  bathing/showering or dressing?: Yes Do you need assistance with meal preparation?: Yes Do you need assistance with eating?: No Do you have difficulty maintaining continence: No Do you need assistance with getting out of bed/getting out of a chair/moving?: No Do you have difficulty managing or taking your medications?: No  Follow up appointments reviewed: PCP Follow-up appointment confirmed?: Yes Date of PCP follow-up appointment?: 07/06/24 Follow-up Provider: [facility] Follow-up appointment confirmed?: Yes Date of Specialist follow-up appointment?: 07/05/24 Follow-Up Specialty Provider:: [facility] Do you need transportation to your follow-up appointment?: No Do you understand care options if your condition(s) worsen?: Yes-patient verbalized understanding  SDOH Interventions Today    Flowsheet Row Most Recent Value  SDOH Interventions   Food Insecurity Interventions Intervention Not Indicated  Housing Interventions Intervention Not Indicated  Transportation Interventions Intervention Not Indicated, Patient Resources (Friends/Family)  Utilities Interventions Intervention Not Indicated   RN  discussed wound care. RN discussed the importance of f/u appointment with PCP. Patient took information and set up appointment RN discussed the services of [facility]  The patient has been provided with contact information for the care management team and has been advised to call with any health related questions or concerns Cathlean Headland BSN RN [facility] [address] [facility] Health Care Management Coordinator [email or web address] Direct Dial: [phone]  Fax: [phone] Website: [email or web address]

## 2024-06-28 DIAGNOSIS — M4326 Fusion of spine, lumbar region: Secondary | ICD-10-CM | POA: Diagnosis not present

## 2024-06-28 DIAGNOSIS — Z471 Aftercare following joint replacement surgery: Secondary | ICD-10-CM | POA: Diagnosis not present

## 2024-06-28 DIAGNOSIS — F32A Depression, unspecified: Secondary | ICD-10-CM | POA: Diagnosis not present

## 2024-06-28 DIAGNOSIS — Z96641 Presence of right artificial hip joint: Secondary | ICD-10-CM | POA: Diagnosis not present

## 2024-06-28 DIAGNOSIS — Z7983 Long term (current) use of bisphosphonates: Secondary | ICD-10-CM | POA: Diagnosis not present

## 2024-06-28 DIAGNOSIS — Z86718 Personal history of other venous thrombosis and embolism: Secondary | ICD-10-CM | POA: Diagnosis not present

## 2024-06-28 DIAGNOSIS — Z981 Arthrodesis status: Secondary | ICD-10-CM | POA: Diagnosis not present

## 2024-06-28 DIAGNOSIS — Z9181 History of falling: Secondary | ICD-10-CM | POA: Diagnosis not present

## 2024-07-04 NOTE — Progress Notes (Unsigned)
 [facility] [address], [address] [place], [place] [address] [phone]  Date:  07/06/2024   Name:  Ashley Craig   DOB:  22-Feb-1951   MRN:  [id number]  PCP:  Watt Harlene BROCKS, MD    Chief Complaint: No chief complaint on file.   History of Present Illness:  Ashley Craig is a 73 y.o. very pleasant female patient who presents with the following:  Patient seen today for follow-up following recent right total hip.  I saw her most recently in July for her physical History of allergies, GERD, vertigo, HTN, Fuch's corneal dystrophy, likely benign elevation of alk phos  She had a lumbar operation in February 2023 per Dr. Burman and has a spinal cord stimulator.  Osteopenia on fosamax    She was admitted at [facility] from 8/20 through 8/25: This patient was admitted on 06/21/2024 for ERAS RIGHT Total hip replacement anterior approach. Postoperatively the patient was transferred to the medical surgical floor in stable condition on perioperative antibiotics and Xarelto 10mg  for DVT prophylaxis for 30 days after surgery. They remained here for the duration of their hospital stay which has been without incident. They have progressed slowly with physical therapy, and will be discharged home with home health services and DME arranged. They will have the following instructions upon discharge as well: Keep the incision site dry until follow-up in 2 weeks, Cefadroxil antibiotic to be started upon discharge for 1 week, and Xarelto 10 mg for DVT prophylaxis   Discussed the use of AI scribe software for clinical note transcription with the patient, who gave verbal consent to proceed.  History of Present Illness      Patient Active Problem List   Diagnosis Date Noted   Allergic rhinitis due to animal hair and dander 09/25/2019   Anaphylactic shock due to adverse food reaction 05/11/2018   Seasonal allergic conjunctivitis  05/09/2018   Impingement syndrome of left ankle 07/12/2017   Diarrhea 05/21/2017   Gastroesophageal reflux disease with esophagitis 05/21/2017   History of colon polyps 05/21/2017   Orthostatic hypotension 04/08/2017   Unilateral primary osteoarthritis, left knee 11/13/2016   Allergic dermatitis 08/12/2016   Hypermetropia of both eyes 06/02/2016   Open angle with borderline findings and low glaucoma risk in both eyes 06/02/2016   Pinguecula of both eyes 06/02/2016   Presbyopia of both eyes 06/02/2016   Regular astigmatism of both eyes 06/02/2016   Varicose veins of left lower extremity with pain 01/16/2016   Mild persistent asthma without complication 11/05/2015   Chronic venous insufficiency 08/07/2015   Spontaneous hematoma of lower leg 11/30/2014   Spinal stenosis of lumbar region 10/11/2014   Adjustment disorder 04/27/2014   Benign essential hypertension 04/27/2014   Benign paroxysmal vertigo 04/27/2014   Chondrocostal junction syndrome 04/27/2014   Elevation of level of transaminase or lactic acid dehydrogenase (LDH) 04/27/2014   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   Overweight 04/27/2014   Spontaneous ecchymosis 04/27/2014   Tendonitis of shoulder 04/27/2014   Anxiety 03/27/2014   Environmental allergies 03/27/2014   Hypercholesteremia 03/27/2014   Insomnia 03/27/2014   Vitamin D  deficiency 03/27/2014    Past Medical History:  Diagnosis Date   Anxiety    takes Zoloft  daily;takes Clonazepam  daily as needed   Arthritis    Cataract 05/01/2024   left eye   Chronic back pain    stenosis   Diarrhea    occasionally   GERD (gastroesophageal reflux  disease)    takes Omeprazole  daily    History of blood clots 10 yrs ago   left calf after a bone break   History of bronchitis 2014   History of colon polyps    benign   Hyperlipidemia    takes Simvastatin  daily   Hypertension    Insomnia    takes Melatonin nightly   Joint pain    Seasonal allergies    takes Claritin   daily;Nasonex and ProAir  as needed;Singulair  daily   Weakness    numbness and tingling in right leg    Past Surgical History:  Procedure Laterality Date   ANAL FISSURE REPAIR     ANKLE SURGERY Left 09/2017   colonosocpy     D&C of bladder  30yrs ago   ESOPHAGOGASTRODUODENOSCOPY     with ED   TONSILLECTOMY     WRIST SURGERY Right    with plate    Social History   Tobacco Use   Smoking status: Never   Smokeless tobacco: Never  Vaping Use   Vaping status: Never Used  Substance Use Topics   Alcohol use: Yes    Alcohol/week: 7.0 standard drinks of alcohol    Types: 7 Glasses of wine per week   Drug use: No    Family History  Problem Relation Age of Onset   Cancer Mother    Retinitis pigmentosa Father    Depression Father    Anxiety disorder Sister    Arthritis Sister        had shoulder replacement and knee replacement   Melanoma Brother    Anxiety disorder Brother    Anorexia nervosa Brother    Anorexia nervosa Cousin    Leukemia Niece    Allergic rhinitis Neg Hx    Angioedema Neg Hx    Asthma Neg Hx    Eczema Neg Hx    Immunodeficiency Neg Hx    Urticaria Neg Hx    Breast cancer Neg Hx     Allergies  Allergen Reactions   Other Anaphylaxis    Shellfish-Anaphylaxis   Shellfish Allergy Other (See Comments) and Swelling   Codeine     nausea   Dog Epithelium     Medication list has been reviewed and updated.  Current Outpatient Medications on File Prior to Visit  Medication Sig Dispense Refill   albuterol  (PROVENTIL ) (2.5 MG/3ML) 0.083% nebulizer solution Take 3 mLs (2.5 mg total) by nebulization every 6 (six) hours as needed for wheezing or shortness of breath. 75 mL 1   albuterol  (VENTOLIN  HFA) 108 (90 Base) MCG/ACT inhaler Inhale 2 puffs into the lungs every 6 (six) hours as needed for wheezing or shortness of breath. 8 g 2   alendronate  (FOSAMAX ) 70 MG tablet Take 1 tablet (70 mg total) by mouth once a week. Take with a full glass of water on an empty  stomach. 12 tablet 3   Azelastine  HCl 137 MCG/SPRAY SOLN Place 2 sprays into both nostrils 2 (two) times daily as needed. 90 mL 1   busPIRone  (BUSPAR ) 15 MG tablet Take 1.5 tablets (22.5 mg total) by mouth 2 (two) times daily. 135 tablet 1   Calcium Carb-Cholecalciferol 600-10 MG-MCG TABS Take by mouth. (Patient taking differently: Take 1 tablet by mouth in the morning and at bedtime.)     cefadroxil (DURICEF) 500 MG capsule Take 500 mg by mouth 2 (two) times daily. daily for 7 days     clonazePAM  (KLONOPIN ) 0.5 MG tablet Take 1 tablet (0.5 mg  total) by mouth daily as needed for anxiety. TAKE 1 TABLET BY MOUTH EVERY DAY AS NEEDED FOR ANXIETY 30 tablet 5   clonazePAM  (KLONOPIN ) 0.5 MG tablet Take 0.5 mg by mouth 2 (two) times daily as needed for anxiety.     Cyanocobalamin (VITAMIN B 12 PO) Take by mouth. (Patient taking differently: Take 1 tablet by mouth in the morning and at bedtime.)     docusate sodium (COLACE) 100 MG capsule Take 200 mg by mouth daily as needed for mild constipation.     EPINEPHrine  (EPIPEN  2-PAK) 0.3 mg/0.3 mL IJ SOAJ injection Inject 0.3 mg into the muscle as needed for anaphylaxis. 2 each 1   lamoTRIgine  (LAMICTAL ) 150 MG tablet Take 2 tablets (300 mg total) by mouth daily. 180 tablet 1   loratadine  (CLARITIN ) 10 MG tablet TAKE 1 TABLET BY MOUTH EVERY DAY 90 tablet 1   losartan  (COZAAR ) 25 MG tablet Take 1 tablet (25 mg total) by mouth daily. 90 tablet 3   Melatonin 3 MG TABS Take 3 mg by mouth at bedtime. Reported on 11/05/2015     methocarbamol (ROBAXIN) 500 MG tablet Take 500 mg by mouth 4 (four) times daily. daily for 10 days     montelukast  (SINGULAIR ) 10 MG tablet Take 1 tablet (10 mg total) by mouth daily. 90 tablet 3   Omega-3 Fatty Acids (FISH OIL PO) Take by mouth.     omeprazole  (PRILOSEC) 20 MG capsule TAKE 1 CAPSULE BY MOUTH EVERY DAY 90 capsule 1   ondansetron  (ZOFRAN ) 4 MG tablet Take 4 mg by mouth every 8 (eight) hours as needed for nausea or vomiting. for up  to 7 days     oxyCODONE  (OXY IR/ROXICODONE ) 5 MG immediate release tablet Take 5 mg by mouth every 6 (six) hours as needed for severe pain (pain score 7-10). as needed for Pain for up to 7 days     rivaroxaban (XARELTO) 10 MG TABS tablet Take 10 mg by mouth daily. daily for 30 days     sertraline  (ZOLOFT ) 100 MG tablet Take 2 tablets (200 mg total) by mouth daily. 180 tablet 1   simvastatin  (ZOCOR ) 40 MG tablet Take 1 tablet (40 mg total) by mouth daily at 6 PM. 90 tablet 3   temazepam  (RESTORIL ) 15 MG capsule TAKE 1 TO 2 CAPSULES BY MOUTH AT BEDTIME AS NEEDED 30 capsule 5   vitamin E 180 MG (400 UNITS) capsule Take 400 Units by mouth daily.     No current facility-administered medications on file prior to visit.    Review of Systems:  As per HPI- otherwise negative.   Physical Examination: There were no vitals filed for this visit. There were no vitals filed for this visit. There is no height or weight on file to calculate BMI. Ideal Body Weight:    GEN: no acute distress. HEENT: Atraumatic, Normocephalic.  Ears and Nose: No external deformity. CV: RRR, No M/G/R. No JVD. No thrill. No extra heart sounds. PULM: CTA B, no wheezes, crackles, rhonchi. No retractions. No resp. distress. No accessory muscle use. ABD: S, NT, ND, +BS. No rebound. No HSM. EXTR: No c/c/e PSYCH: Normally interactive. Conversant.    Assessment and Plan: No diagnosis found.  Assessment & Plan   Signed Harlene Schroeder, MD

## 2024-07-05 DIAGNOSIS — M1611 Unilateral primary osteoarthritis, right hip: Secondary | ICD-10-CM | POA: Diagnosis not present

## 2024-07-05 DIAGNOSIS — Z96641 Presence of right artificial hip joint: Secondary | ICD-10-CM | POA: Diagnosis not present

## 2024-07-06 ENCOUNTER — Encounter: Payer: Self-pay | Admitting: Family Medicine

## 2024-07-06 ENCOUNTER — Ambulatory Visit: Admitting: Family Medicine

## 2024-07-06 VITALS — BP 126/72 | HR 60 | Ht 67.0 in | Wt 169.0 lb

## 2024-07-06 DIAGNOSIS — Z86718 Personal history of other venous thrombosis and embolism: Secondary | ICD-10-CM | POA: Diagnosis not present

## 2024-07-06 DIAGNOSIS — Z96641 Presence of right artificial hip joint: Secondary | ICD-10-CM

## 2024-07-06 DIAGNOSIS — Z23 Encounter for immunization: Secondary | ICD-10-CM

## 2024-07-06 DIAGNOSIS — J45909 Unspecified asthma, uncomplicated: Secondary | ICD-10-CM

## 2024-07-06 NOTE — Patient Instructions (Signed)
 Good to see you!   Covid booster this fall Also recommend flu and RSV vaccine this fall  Take care- please see me in about 4 months for a routine check in

## 2024-07-11 ENCOUNTER — Telehealth: Payer: Self-pay | Admitting: Physician Assistant

## 2024-07-11 DIAGNOSIS — F411 Generalized anxiety disorder: Secondary | ICD-10-CM

## 2024-07-11 DIAGNOSIS — F32A Depression, unspecified: Secondary | ICD-10-CM

## 2024-07-11 MED ORDER — SERTRALINE HCL 100 MG PO TABS: 200.0000 mg | ORAL_TABLET | Freq: Every day | ORAL | 1 refills | Status: AC

## 2024-07-11 NOTE — Telephone Encounter (Signed)
 PT left vm ref refill on Zoloft     Next appt 11/07/24    [facility] [address]

## 2024-07-11 NOTE — Telephone Encounter (Signed)
 Sent!

## 2024-08-11 ENCOUNTER — Other Ambulatory Visit: Payer: Self-pay | Admitting: Physician Assistant

## 2024-08-11 DIAGNOSIS — F411 Generalized anxiety disorder: Secondary | ICD-10-CM

## 2024-08-14 ENCOUNTER — Other Ambulatory Visit: Payer: Self-pay | Admitting: Family Medicine

## 2024-08-14 DIAGNOSIS — E785 Hyperlipidemia, unspecified: Secondary | ICD-10-CM

## 2024-08-25 DIAGNOSIS — H16223 Keratoconjunctivitis sicca, not specified as Sjogren's, bilateral: Secondary | ICD-10-CM | POA: Diagnosis not present

## 2024-08-25 DIAGNOSIS — H18513 Endothelial corneal dystrophy, bilateral: Secondary | ICD-10-CM | POA: Diagnosis not present

## 2024-08-25 DIAGNOSIS — H40013 Open angle with borderline findings, low risk, bilateral: Secondary | ICD-10-CM | POA: Diagnosis not present

## 2024-09-11 NOTE — Patient Instructions (Signed)
It was great to see you today.

## 2024-09-11 NOTE — Progress Notes (Signed)
 [facility] [address] [place], [place] [address] [phone]  Date:  09/14/2024   Name:  Ashley Craig   DOB:  Feb 23, 1951   MRN:  [id number]  PCP:  Watt Harlene BROCKS, MD    Chief Complaint: Sore Throat (L sided sore throat onset a month, my nose is raw and I have extreme fatigue. My eyes feel strange and I just don't feel good )   History of Present Illness:  Ashley Craig is a 73 y.o. very pleasant female patient who presents with the following:  Patient is in today with concern of discomfort in the left side of her throat.  I saw her most recently in September after she recently had a hip replacement, this has done well  History of allergies, GERD, vertigo, HTN, Fuch's corneal dystrophy, likely benign elevation of alk phos  She had a lumbar operation in February 2023 per Dr. Burman and has a spinal cord stimulator.  Osteopenia on fosamax    -Flu shot- done  -COVID booster -She has completed Shingrix  Never a smoker She does drink etoh some   Wt Readings from Last 3 Encounters:  09/14/24 170 lb 9.6 oz (77.4 kg)  07/06/24 169 lb (76.7 kg)  05/11/24 170 lb 12.8 oz (77.5 kg)   .  Discussed the use of AI scribe software for clinical note transcription with the patient, who gave verbal consent to proceed.  History of Present Illness [name] is a 73 year old female who presents with a sore throat and fatigue.  She has been experiencing a sore throat on the left side for the past month. The pain is intermittent, sometimes worsening, and is exacerbated by pressing on the area and swallowing. The pain radiates to her ear. No difficulty swallowing or changes in her voice, although she experiences intermittent hoarseness, which she attributes to her history of allergies. She mentions that her throat was 'fire engine red' at one point, but it appears less red now.  She reports increased nasal  drainage and a sensation of looking 'out behind the curtains,' which she associates with her allergies. No sinus pressure or congestion, but frequent nasal drainage is noted. She is currently taking sertraline  and Claritin  for her allergies.  She feels fatigued and lacking energy. She completed physical therapy for two weeks post-surgery and reports that the initial burning pain from the surgery has subsided. She is no longer taking oxycodone  or blood thinners.  No dental pain or issues with her teeth. She has received all her vaccinations, including the flu shot.    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 risk in both eyes 06/02/2016   Pinguecula of both eyes 06/02/2016   Presbyopia of both eyes 06/02/2016   Regular astigmatism of both eyes 06/02/2016   Varicose veins of left lower extremity with pain 01/16/2016   Mild persistent asthma without complication 11/05/2015  Chronic venous insufficiency 08/07/2015   Spontaneous hematoma of lower leg 11/30/2014   Spinal stenosis of lumbar region 10/11/2014   Adjustment disorder 04/27/2014   Benign essential hypertension 04/27/2014   Benign paroxysmal vertigo 04/27/2014   Chondrocostal junction syndrome 04/27/2014   Elevation of level of transaminase or lactic acid dehydrogenase (LDH) 04/27/2014   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   Overweight 04/27/2014   Spontaneous  ecchymosis 04/27/2014   Tendonitis of shoulder 04/27/2014   Anxiety 03/27/2014   Environmental allergies 03/27/2014   Hypercholesteremia 03/27/2014   Insomnia 03/27/2014   Vitamin D  deficiency 03/27/2014    Past Medical History:  Diagnosis Date   Anxiety    takes Zoloft  daily;takes Clonazepam  daily as needed   Arthritis    Cataract 05/01/2024   left eye   Chronic back pain    stenosis   Diarrhea    occasionally   GERD (gastroesophageal reflux disease)    takes Omeprazole  daily    History of blood clots 10 yrs ago   left calf after a bone break   History of bronchitis 2014   History of colon polyps    benign   Hyperlipidemia    takes Simvastatin  daily   Hypertension    Insomnia    takes Melatonin nightly   Joint pain    Seasonal allergies    takes Claritin  daily;Nasonex and ProAir  as needed;Singulair  daily   Weakness    numbness and tingling in right leg    Past Surgical History:  Procedure Laterality Date   ANAL FISSURE REPAIR     ANKLE SURGERY Left 09/2017   colonosocpy     D&C of bladder  59yrs ago   ESOPHAGOGASTRODUODENOSCOPY     with ED   TONSILLECTOMY     WRIST SURGERY Right    with plate    Social History   Tobacco Use   Smoking status: Never   Smokeless tobacco: Never  Vaping Use   Vaping status: Never Used  Substance Use Topics   Alcohol use: Yes    Alcohol/week: 7.0 standard drinks of alcohol    Types: 7 Glasses of wine per week   Drug use: No    Family History  Problem Relation Age of Onset   Cancer Mother    Retinitis pigmentosa Father    Depression Father    Anxiety disorder Sister    Arthritis Sister        had shoulder replacement and knee replacement   Melanoma Brother    Anxiety disorder Brother    Anorexia nervosa Brother    Anorexia nervosa Cousin    Leukemia Niece    Allergic rhinitis Neg Hx    Angioedema Neg Hx    Asthma Neg Hx    Eczema Neg Hx    Immunodeficiency Neg Hx    Urticaria Neg Hx    Breast cancer Neg  Hx     Allergies  Allergen Reactions   Other Anaphylaxis    Shellfish-Anaphylaxis   Shellfish Allergy Other (See Comments) and Swelling   Codeine     nausea   Dog Epithelium     Medication list has been reviewed and updated.  Current Outpatient Medications on File Prior to Visit  Medication Sig Dispense Refill   albuterol  (PROVENTIL ) (2.5 MG/3ML) 0.083% nebulizer solution Take 3 mLs (2.5 mg total) by nebulization every 6 (six) hours as needed for wheezing or shortness of breath. 75 mL 1   albuterol  (VENTOLIN  HFA) 108 (90 Base) MCG/ACT inhaler Inhale 2  puffs into the lungs every 6 (six) hours as needed for wheezing or shortness of breath. 8 g 2   alendronate  (FOSAMAX ) 70 MG tablet Take 1 tablet (70 mg total) by mouth once a week. Take with a full glass of water on an empty stomach. 12 tablet 3   Azelastine  HCl 137 MCG/SPRAY SOLN Place 2 sprays into both nostrils 2 (two) times daily as needed. 90 mL 1   busPIRone  (BUSPAR ) 15 MG tablet Take 1.5 tablets (22.5 mg total) by mouth 2 (two) times daily. 135 tablet 1   clonazePAM  (KLONOPIN ) 0.5 MG tablet Take 0.5 mg by mouth 2 (two) times daily as needed for anxiety.     EPINEPHrine  (EPIPEN  2-PAK) 0.3 mg/0.3 mL IJ SOAJ injection Inject 0.3 mg into the muscle as needed for anaphylaxis. 2 each 1   lamoTRIgine  (LAMICTAL ) 150 MG tablet Take 2 tablets (300 mg total) by mouth daily. 180 tablet 1   loratadine  (CLARITIN ) 10 MG tablet TAKE 1 TABLET BY MOUTH EVERY DAY 90 tablet 1   Melatonin 3 MG TABS Take 3 mg by mouth at bedtime. Reported on 11/05/2015     montelukast  (SINGULAIR ) 10 MG tablet Take 1 tablet (10 mg total) by mouth daily. 90 tablet 3   Omega-3 Fatty Acids (FISH OIL PO) Take by mouth.     sertraline  (ZOLOFT ) 100 MG tablet Take 2 tablets (200 mg total) by mouth daily. 180 tablet 1   simvastatin  (ZOCOR ) 40 MG tablet Take 1 tablet (40 mg total) by mouth daily at 6 PM. 90 tablet 3   temazepam  (RESTORIL ) 15 MG capsule TAKE 1 TO 2 CAPSULES BY MOUTH  AT BEDTIME AS NEEDED 30 capsule 5   vitamin E 180 MG (400 UNITS) capsule Take 400 Units by mouth daily.     losartan  (COZAAR ) 25 MG tablet Take 1 tablet (25 mg total) by mouth daily. (Patient not taking: Reported on 09/14/2024) 90 tablet 3   omeprazole  (PRILOSEC) 20 MG capsule TAKE 1 CAPSULE BY MOUTH EVERY DAY (Patient not taking: Reported on 09/14/2024) 90 capsule 1   No current facility-administered medications on file prior to visit.    Review of Systems:  As per HPI- otherwise negative.   Physical Examination: Vitals:   09/14/24 1534  BP: 120/68  Pulse: 70  Temp: 98.8 F (37.1 C)  SpO2: 98%   Vitals:   09/14/24 1534  Weight: 170 lb 9.6 oz (77.4 kg)  Height: 5' 7 (1.702 m)   Body mass index is 26.72 kg/m. Ideal Body Weight: Weight in (lb) to have BMI = 25: 159.3  GEN: no acute distress.  Normal weight, looks well  HEENT: Atraumatic, Normocephalic.  Bilateral TM wnl, oropharynx normal.  PEERL,EOMI.  Some tenderness to percussion of the sinuses  Ears and Nose: No external deformity. CV: RRR, No M/G/R. No JVD. No thrill. No extra heart sounds. PULM: CTA B, no wheezes, crackles, rhonchi. No retractions. No resp. distress. No accessory muscle use. ABD: S, NT, ND, +BS. No rebound. No HSM. EXTR: No c/c/e PSYCH: Normally interactive. Conversant.   Rapid strep negative today    Assessment and Plan: Acute recurrent frontal sinusitis - Plan: doxycycline  (VIBRAMYCIN ) 100 MG capsule  Pharyngitis, unspecified etiology - Plan: POCT rapid strep A  Assessment & Plan Acute recurrent frontal sinusitis and acute pharyngitis Intermittent sore throat, ear pain, and hoarseness may suggest bacterial etiology. - Prescribed doxycycline . - Advised to report if symptoms persist for potential CT scan.  Signed Harlene Schroeder, MD "

## 2024-09-14 ENCOUNTER — Ambulatory Visit (INDEPENDENT_AMBULATORY_CARE_PROVIDER_SITE_OTHER): Admitting: Family Medicine

## 2024-09-14 ENCOUNTER — Encounter: Payer: Self-pay | Admitting: Family Medicine

## 2024-09-14 VITALS — BP 120/68 | HR 70 | Temp 98.8°F | Ht 67.0 in | Wt 170.6 lb

## 2024-09-14 DIAGNOSIS — J029 Acute pharyngitis, unspecified: Secondary | ICD-10-CM

## 2024-09-14 DIAGNOSIS — J0111 Acute recurrent frontal sinusitis: Secondary | ICD-10-CM

## 2024-09-14 MED ORDER — DOXYCYCLINE HYCLATE 100 MG PO CAPS: 100.0000 mg | ORAL_CAPSULE | Freq: Two times a day (BID) | ORAL | 0 refills | Status: DC

## 2024-09-18 ENCOUNTER — Telehealth: Payer: Self-pay | Admitting: Physician Assistant

## 2024-09-18 DIAGNOSIS — F411 Generalized anxiety disorder: Secondary | ICD-10-CM

## 2024-09-18 MED ORDER — BUSPIRONE HCL 15 MG PO TABS: 22.5000 mg | ORAL_TABLET | Freq: Two times a day (BID) | ORAL | 0 refills | Status: DC

## 2024-09-18 NOTE — Telephone Encounter (Signed)
 Patient lvm for refill on Buspar  15mg . States that she is out and if refill could be sent today if possible. Ph: [phone] appt 1/6 Pharmacy [facility] [address] [place], [place]

## 2024-09-18 NOTE — Telephone Encounter (Signed)
 Sent!

## 2024-09-25 ENCOUNTER — Encounter: Payer: Self-pay | Admitting: Family Medicine

## 2024-09-26 MED ORDER — AMOXICILLIN 875 MG PO TABS: 875.0000 mg | ORAL_TABLET | Freq: Two times a day (BID) | ORAL | 0 refills | Status: DC

## 2024-09-26 NOTE — Addendum Note (Signed)
 Addended by: WATT RAISIN C on: 09/26/2024 08:24 PM   Modules accepted: Orders

## 2024-10-05 ENCOUNTER — Other Ambulatory Visit: Payer: Self-pay | Admitting: Family Medicine

## 2024-10-05 DIAGNOSIS — M8580 Other specified disorders of bone density and structure, unspecified site: Secondary | ICD-10-CM

## 2024-10-10 ENCOUNTER — Other Ambulatory Visit: Payer: Self-pay | Admitting: Physician Assistant

## 2024-10-10 DIAGNOSIS — F411 Generalized anxiety disorder: Secondary | ICD-10-CM

## 2024-10-10 DIAGNOSIS — F32A Depression, unspecified: Secondary | ICD-10-CM

## 2024-11-07 ENCOUNTER — Encounter: Payer: Self-pay | Admitting: Physician Assistant

## 2024-11-07 ENCOUNTER — Ambulatory Visit: Admitting: Physician Assistant

## 2024-11-07 DIAGNOSIS — G4700 Insomnia, unspecified: Secondary | ICD-10-CM

## 2024-11-07 DIAGNOSIS — F411 Generalized anxiety disorder: Secondary | ICD-10-CM

## 2024-11-07 DIAGNOSIS — F32A Depression, unspecified: Secondary | ICD-10-CM | POA: Diagnosis not present

## 2024-11-07 MED ORDER — LAMOTRIGINE 150 MG PO TABS: 300.0000 mg | ORAL_TABLET | Freq: Every day | ORAL | 1 refills | Status: AC

## 2024-11-07 MED ORDER — CLONAZEPAM 0.5 MG PO TABS: 0.5000 mg | ORAL_TABLET | Freq: Two times a day (BID) | ORAL | 5 refills | Status: AC | PRN

## 2024-11-07 MED ORDER — BUSPIRONE HCL 30 MG PO TABS: 30.0000 mg | ORAL_TABLET | Freq: Two times a day (BID) | ORAL | 1 refills | Status: AC

## 2024-11-07 NOTE — Progress Notes (Signed)
 "     [facility]  Patient ID: Ashley Craig,  MRN: [id number]  PCP: Watt Harlene BROCKS, MD  Date of Evaluation: 11/07/2024 Time spent:20 minutes  Chief Complaint:  Chief Complaint   Anxiety; Depression; Insomnia; Follow-up    HISTORY/CURRENT STATUS: HPI  For routine med check  Has a lot more anxiety the past few months. Partly triggered by the hip surgery, although she recovered well.  Anesthesia affects mood. She's needed the Klonopin  more often lately. It is helpful when she feels so overwhelmed. Not depressed.  Energy and motivation are good.  No extreme sadness, tearfulness, or feelings of hopelessness.  Sleeps ok.  ADLs and personal hygiene are normal.   Denies any changes in concentration, making decisions, or remembering things.  Appetite has not changed.  Weight is stable.   No mania, delirium, AH/VH.  No SI/HI.  Individual Medical History/ Review of Systems: Changes? 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     Past Psychiatric Medication Trials: Klonopin - Effective for anxiety. She reports that she has gone weeks without taking it and takes more of it during times of increased stress Lamictal - Has helped stabilize mood. Reports that she has been on higher and lower doses. Has taken at least one year.  Effexor XR- Severe discontinuation s/s.  Prozac Zoloft - Has taken long-term Temazepam - Has used prn when traveling Gabapentin - Difficulty with balance. Took for pain. Buspar  Allergies: Other, Shellfish allergy, Codeine, and Dog epithelium  Current Medications:  Current Outpatient Medications:    albuterol  (PROVENTIL ) (2.5 MG/3ML) 0.083% nebulizer solution, Take 3 mLs (2.5 mg total) by nebulization every 6 (six) hours as needed for wheezing or shortness of breath., Disp: 75 mL, Rfl: 1   albuterol  (VENTOLIN  HFA) 108 (90 Base) MCG/ACT inhaler, Inhale 2 puffs into the lungs every 6 (six) hours as needed for wheezing or shortness of breath., Disp: 8 g, Rfl: 2   alendronate   (FOSAMAX ) 70 MG tablet, TAKE 1 TABLET(70 MG) BY MOUTH 1 TIME A WEEK WITH A FULL GLASS OF WATER AND ON AN EMPTY STOMACH, Disp: 12 tablet, Rfl: 3   Azelastine  HCl 137 MCG/SPRAY SOLN, Place 2 sprays into both nostrils 2 (two) times daily as needed., Disp: 90 mL, Rfl: 1   busPIRone  (BUSPAR ) 30 MG tablet, Take 1 tablet (30 mg total) by mouth 2 (two) times daily., Disp: 180 tablet, Rfl: 1   loratadine  (CLARITIN ) 10 MG tablet, TAKE 1 TABLET BY MOUTH EVERY DAY, Disp: 90 tablet, Rfl: 1   Melatonin 3 MG TABS, Take 3 mg by mouth at bedtime. Reported on 11/05/2015, Disp: , Rfl:    montelukast  (SINGULAIR ) 10 MG tablet, Take 1 tablet (10 mg total) by mouth daily., Disp: 90 tablet, Rfl: 3   Omega-3 Fatty Acids (FISH OIL PO), Take by mouth., Disp: , Rfl:    sertraline  (ZOLOFT ) 100 MG tablet, Take 2 tablets (200 mg total) by mouth daily., Disp: 180 tablet, Rfl: 1   simvastatin  (ZOCOR ) 40 MG tablet, Take 1 tablet (40 mg total) by mouth daily at 6 PM., Disp: 90 tablet, Rfl: 3   temazepam  (RESTORIL ) 15 MG capsule, TAKE 1 TO 2 CAPSULES BY MOUTH AT BEDTIME AS NEEDED, Disp: 30 capsule, Rfl: 5   vitamin E 180 MG (400 UNITS) capsule, Take 400 Units by mouth daily., Disp: , Rfl:    amoxicillin  (AMOXIL ) 875 MG tablet, Take 1 tablet (875 mg total) by mouth 2 (two) times daily., Disp: 20 tablet, Rfl: 0   clonazePAM  (KLONOPIN ) 0.5  MG tablet, Take 1 tablet (0.5 mg total) by mouth 2 (two) times daily as needed for anxiety., Disp: 60 tablet, Rfl: 5   EPINEPHrine  (EPIPEN  2-PAK) 0.3 mg/0.3 mL IJ SOAJ injection, Inject 0.3 mg into the muscle as needed for anaphylaxis., Disp: 2 each, Rfl: 1   lamoTRIgine  (LAMICTAL ) 150 MG tablet, Take 2 tablets (300 mg total) by mouth daily., Disp: 180 tablet, Rfl: 1   losartan  (COZAAR ) 25 MG tablet, Take 1 tablet (25 mg total) by mouth daily. (Patient not taking: Reported on 11/07/2024), Disp: 90 tablet, Rfl: 3 Medication Side Effects: none  Family Medical/ Social History: Changes? No  MENTAL HEALTH  EXAM:  There were no vitals taken for this visit.There is no height or weight on file to calculate BMI.  General Appearance: Casual and Well Groomed  Eye Contact:  Good  Speech:  Clear and Coherent and Normal Rate  Volume:  Normal  Mood:  Euthymic  Affect:  Congruent  Thought Process:  Goal Directed and Descriptions of Associations: Circumstantial  Orientation:  Full (Time, Place, and Person)  Thought Content: Logical   Suicidal Thoughts:  No  Homicidal Thoughts:  No  Memory:  WNL  Judgement:  Good  Insight:  Good  Psychomotor Activity:  Normal  Concentration:  Concentration: Good and Attention Span: Good  Recall:  Good  Fund of Knowledge: Good  Language: Good  Assets:  Communication Skills Desire for Improvement Financial Resources/Insurance Housing Resilience Transportation  ADL's:  Intact  Cognition: WNL  Prognosis:  Good   DIAGNOSES:    ICD-10-CM   1. Generalized anxiety disorder  F41.1     2. Depression, unspecified depression type  F32.A lamoTRIgine  (LAMICTAL ) 150 MG tablet    3. Insomnia, unspecified type  G47.00       Receiving Psychotherapy: No   RECOMMENDATIONS:   PDMP reviewed.  Klonopin  filled 09/15/2024.  Gabapentin  filled 10/13/2024. I provided approximately 20 minutes of face to face time during this encounter, including time spent before and after the visit in records review, medical decision making, counseling pertinent to today's visit, and charting.   Recommend increasing Buspar .  She agrees.   Increase BuSpar  to 30 mg, po bid.  Continue Klonopin  0.5 mg, 1 p.o. daily as needed. Continue Lamictal  150 mg, 2 p.o. daily. Continue Zoloft  100 mg, 2 p.o. daily. Continue temazepam  15 mg, 1-2 p.o. nightly as needed sleep. Return in 6 weeks.   Verneita Cooks, PA-C  "

## 2024-11-08 ENCOUNTER — Other Ambulatory Visit: Payer: Self-pay | Admitting: Physician Assistant

## 2024-11-08 DIAGNOSIS — F32A Depression, unspecified: Secondary | ICD-10-CM

## 2024-12-20 ENCOUNTER — Ambulatory Visit: Admitting: Physician Assistant

## 2025-05-08 ENCOUNTER — Ambulatory Visit
